# Patient Record
Sex: Female | Born: 1995 | Race: White | Hispanic: No | Marital: Single | Smoking: Never smoker
Health system: Southern US, Community
[De-identification: ages and names within clinical notes are randomized; demographics above are authoritative.]

## PROBLEM LIST (undated history)

## (undated) ENCOUNTER — Inpatient Hospital Stay (HOSPITAL_COMMUNITY): Payer: Self-pay

## (undated) DIAGNOSIS — J45909 Unspecified asthma, uncomplicated: Secondary | ICD-10-CM

## (undated) DIAGNOSIS — Z789 Other specified health status: Secondary | ICD-10-CM

## (undated) DIAGNOSIS — F1910 Other psychoactive substance abuse, uncomplicated: Secondary | ICD-10-CM

## (undated) DIAGNOSIS — F1410 Cocaine abuse, uncomplicated: Secondary | ICD-10-CM

## (undated) DIAGNOSIS — L0291 Cutaneous abscess, unspecified: Secondary | ICD-10-CM

## (undated) HISTORY — PX: DILATION AND CURETTAGE OF UTERUS: SHX78

---
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
## 2015-09-05 ENCOUNTER — Encounter (HOSPITAL_COMMUNITY): Payer: Self-pay

## 2015-10-27 ENCOUNTER — Encounter (HOSPITAL_COMMUNITY): Payer: Self-pay

## 2015-10-27 ENCOUNTER — Emergency Department (HOSPITAL_COMMUNITY)
Admission: EM | Admit: 2015-10-27 | Discharge: 2015-10-27 | Disposition: A | Discharge status: Home / Self Care | Payer: Medicaid Other | Attending: Emergency Medicine | Admitting: Emergency Medicine

## 2015-10-27 DIAGNOSIS — Z79899 Other long term (current) drug therapy: Secondary | ICD-10-CM | POA: Diagnosis not present

## 2015-10-27 DIAGNOSIS — L089 Local infection of the skin and subcutaneous tissue, unspecified: Secondary | ICD-10-CM | POA: Insufficient documentation

## 2015-10-27 DIAGNOSIS — R21 Rash and other nonspecific skin eruption: Secondary | ICD-10-CM | POA: Diagnosis present

## 2015-10-27 DIAGNOSIS — B9689 Other specified bacterial agents as the cause of diseases classified elsewhere: Secondary | ICD-10-CM

## 2015-10-27 LAB — POC URINE PREG, ED: Preg Test, Ur: NEGATIVE

## 2015-10-27 MED ORDER — SULFAMETHOXAZOLE-TRIMETHOPRIM 800-160 MG PO TABS: 1.0000 | ORAL_TABLET | Freq: Two times a day (BID) | ORAL | 0 refills | Status: AC

## 2015-10-27 MED ORDER — MUPIROCIN CALCIUM 2 % EX CREA
TOPICAL_CREAM | Freq: Two times a day (BID) | CUTANEOUS | Status: DC
  Administered 2015-10-27: 22:00:00 via TOPICAL
  Filled 2015-10-27: qty 15

## 2015-10-27 NOTE — ED Notes (Signed)
Pt departed in NAD, refused use of wheelchair. Pt given sandwich meal and bus pass.

## 2015-10-27 NOTE — ED Triage Notes (Signed)
Pt reports she has bumps, possibly boils that form on her face. She reports the one near her eye hurts and causes blurry vision. Pt reports hx of same. Pt denies recently changing household products.

## 2015-10-27 NOTE — ED Provider Notes (Signed)
[facility]PT Provider Note   CSN: [id number] Arrival date & time: 10/27/15  1609  By signing my name below, I, Christy Sartorius, attest that this documentation has been prepared under the direction and in the presence of  Kerrie Buffalo, NP. Electronically Signed: Christy Sartorius, ED Scribe. 10/27/15. 8:27 PM.  History   Chief Complaint Chief Complaint  Patient presents with  . Rash   The history is provided by the patient and medical records. No language interpreter was used.  Rash   This is a new problem. The current episode started more than 1 week ago. The problem has been gradually worsening. The problem is associated with nothing. The rash is present on the face, left upper leg and right upper leg. The pain is mild. The pain has been constant since onset. Associated symptoms include pain. She has tried nothing for the symptoms.  HPI Comments:  Cassidy Underwood is a 20 y.o. female with a history of abscess who presents to the Emergency Department complaining of a rash on her face beginning two weeks ago. Her breakout consists of hard raised areas across her face and on her upper eye.  She adds that they began at her forehead and spread down her face.  She notes a painful pressure at the lumps and states they give her a headache.  She also reports subjective fever, coughing, wheezing and chills and believes it may be due to her current living condition.  She lost her apartment and has been sleeping outside and in the woods.   Pt has had similar symptoms in the past and treated with bactrim with good results.  She denies drainage from the sites.     Past Medical History:  Diagnosis Date  . Abscess     There are no active problems to display for this patient.   Past Surgical History:  Procedure Laterality Date  . DILATION AND EVACUATION N/A 11/09/2014   Procedure: DILATATION AND EVACUATION;  Surgeon: Levie Heritage, DO;  Location: [facility];  Service: Gynecology;  Laterality: N/A;      OB History    Gravida Para Term Preterm AB Living   2             SAB TAB Ectopic Multiple Live Births                   Home Medications    Prior to Admission medications   Medication Sig Start Date End Date Taking? Authorizing Provider  doxycycline (VIBRA-TABS) 100 MG tablet Take 1 tablet (100 mg total) by mouth 2 (two) times daily. 11/09/14   Levie Heritage, DO  oxyCODONE-acetaminophen (ROXICET) 5-325 MG per tablet Take 1-2 tablets by mouth every 4 (four) hours as needed for moderate pain or severe pain. 11/09/14   Levie Heritage, DO  Prenatal Vit-Fe Fumarate-FA (PRENATAL COMPLETE) 14-0.4 MG TABS Take 1 tablet by mouth daily. 02/20/14   Rolland Porter, MD  sulfamethoxazole-trimethoprim (BACTRIM DS,SEPTRA DS) 800-160 MG tablet Take 1 tablet by mouth 2 (two) times daily. 10/27/15 11/03/15  Hope Orlene Och, NP    Family History Family History  Problem Relation Age of Onset  . Adopted: Yes    Social History Social History  Substance Use Topics  . Smoking status: Never Smoker  . Smokeless tobacco: Never Used  . Alcohol use No     Allergies   Review of patient's allergies indicates no known allergies.   Review of Systems Review of Systems  Constitutional: Positive for  chills and fever.  Respiratory: Positive for cough (dry, occasional).   Skin: Positive for rash.  Neurological: Positive for headaches.     Physical Exam Updated Vital Signs BP 125/72 (BP Location: Left Arm)   Pulse 89   Temp 98 F (36.7 C) (Oral)   Resp 14   Ht 4\' 8"  (1.422 m)   Wt 46.3 kg   LMP 10/15/2015 (Approximate)   SpO2 100%   BMI 22.87 kg/m   Physical Exam  Constitutional: She is oriented to person, place, and time. She appears well-developed and well-nourished. No distress.  HENT:  Head: Normocephalic and atraumatic.  Eyes: Conjunctivae are normal.  Cardiovascular: Normal rate.   Pulmonary/Chest: Effort normal.  Abdominal: There is no tenderness.  Neurological: She is alert and  oriented to person, place, and time.  Skin: Skin is warm and dry.  Multiple raised pustular areas, tiny facial abscesses and acne to the forehead, nose, cheeks and chin.  There are a few lesions to the upper legs.   Psychiatric: She has a normal mood and affect. Her behavior is normal.  Nursing note and vitals reviewed.    ED Treatments / Results   DIAGNOSTIC STUDIES:  Oxygen Saturation is 100% on RA, NML by my interpretation.    COORDINATION OF CARE:  8:27 PM Discussed treatment plan with pt at bedside and pt agreed to plan.  Labs (all labs ordered are listed, but only abnormal results are displayed) Labs Reviewed  POC URINE PREG, ED    Radiology No results found.  Procedures Procedures (including critical care time)  Medications Ordered in ED Medications - No data to display   Initial Impression / Assessment and Plan / ED Course  I have reviewed the triage vital signs and the nursing notes.   Clinical Course   Bactroban with first application here in the ED.  Rx Bactrim  Final Clinical Impressions(s) / ED Diagnoses  20 y.o. female with multiple pustular lesions to the face stable for d/c without fever or red streaking. Discussed with the patient plan of care and all questioned fully answered. She will return if any problems arise.  Final diagnoses:  Bacterial skin infection    New Prescriptions Discharge Medication List as of 10/27/2015  9:55 PM    START taking these medications   Details  sulfamethoxazole-trimethoprim (BACTRIM DS,SEPTRA DS) 800-160 MG tablet Take 1 tablet by mouth 2 (two) times daily., Starting Wed 10/27/2015, Until Wed 11/03/2015, Print       I personally performed the services described in this documentation, which was scribed in my presence. The recorded information has been reviewed and is accurate.     [address]Hope HemetM Neese, [place]NP 10/28/15 [id number]    Raeford RazorStephen Kohut, MD 10/28/15 [phone]

## 2015-11-04 IMAGING — US US OB COMP LESS 14 WK
1 series · 14 of 28 positions shown · non-contrast
Comparison: None.

CLINICAL DATA: Patient with recent vaginal bleeding.

EXAM:
OBSTETRIC <14 WK US AND TRANSVAGINAL OB US
TECHNIQUE: Both transabdominal and transvaginal ultrasound examinations were
performed for complete evaluation of the gestation as well as the
maternal uterus, adnexal regions, and pelvic cul-de-sac.
Transvaginal technique was performed to assess early pregnancy.

[Series 1: us ob comp less 14 wks · 14 of 64 slices shown]
[im 3/64]
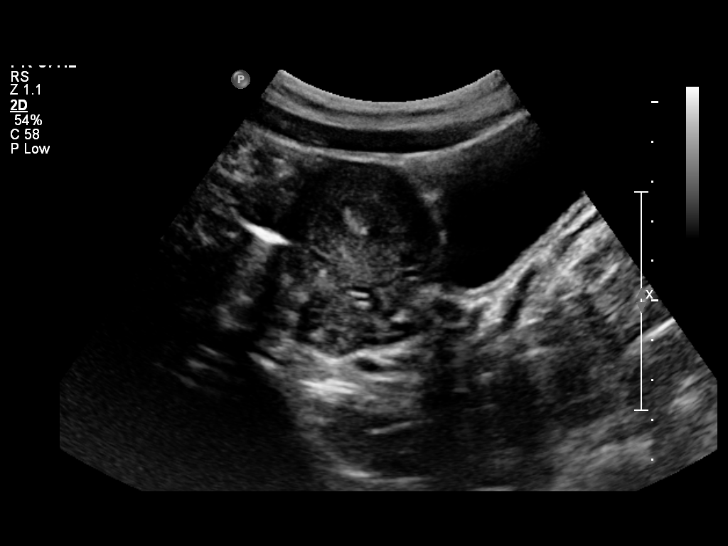
[im 8/64]
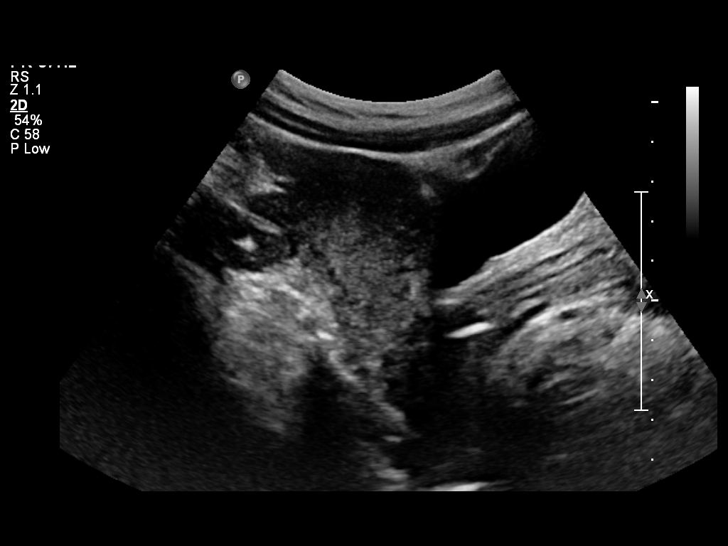
[im 12/64]
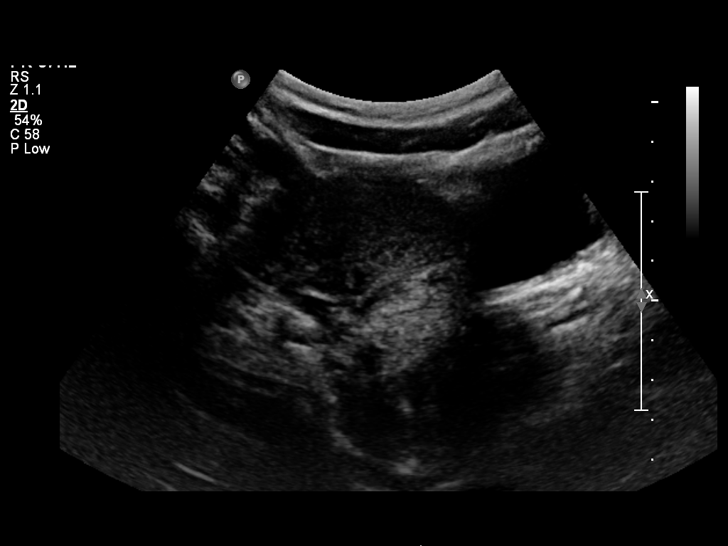
[im 17/64]
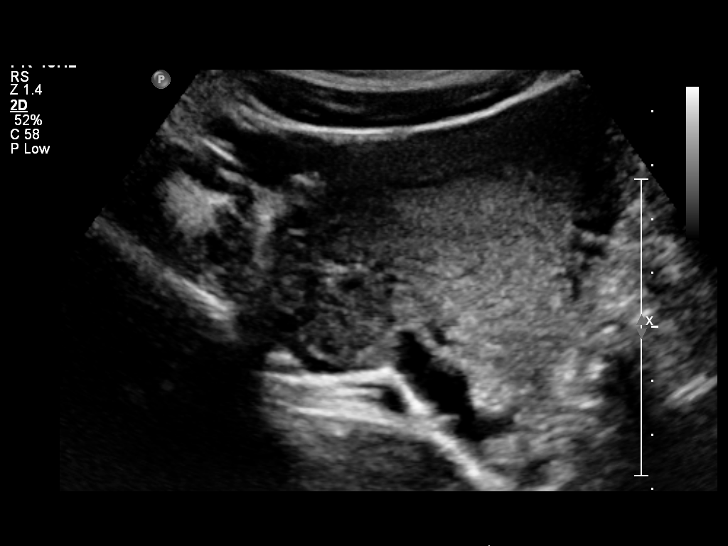
[im 22/64]
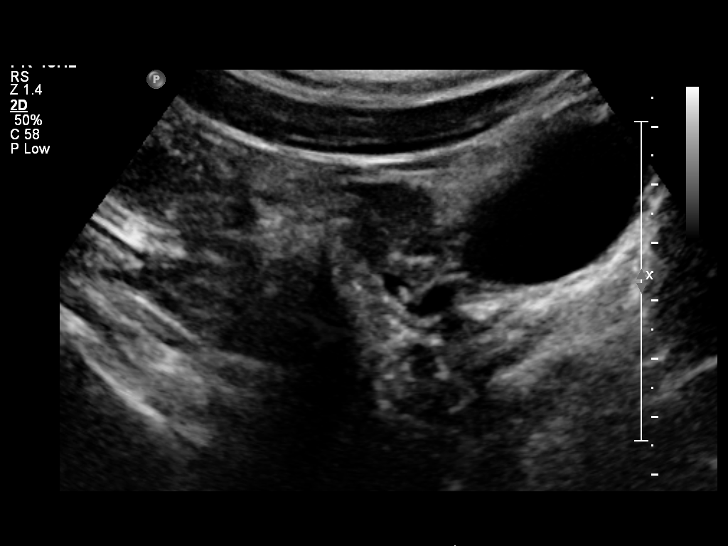
[im 26/64]
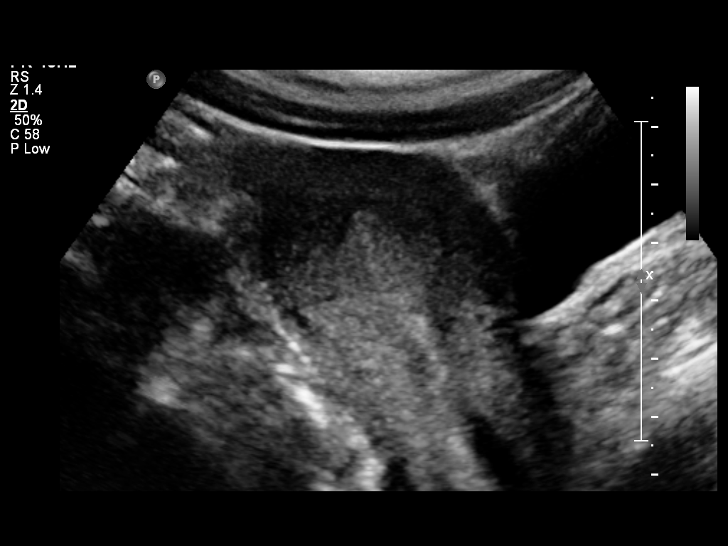
[im 31/64]
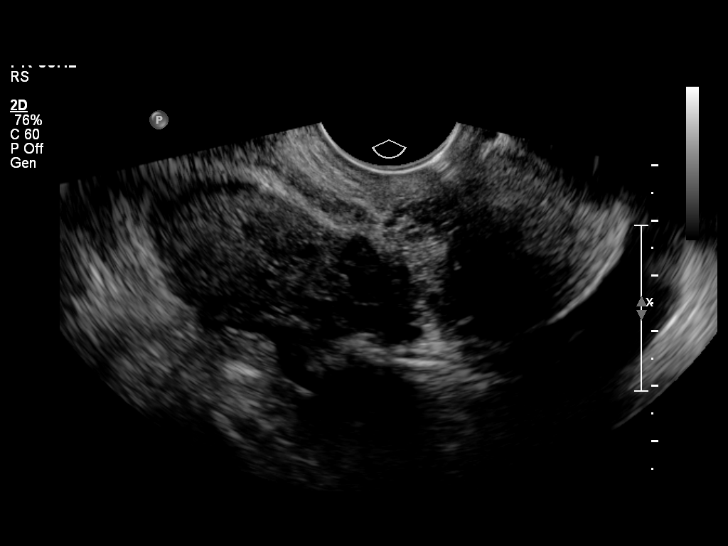
[im 36/64]
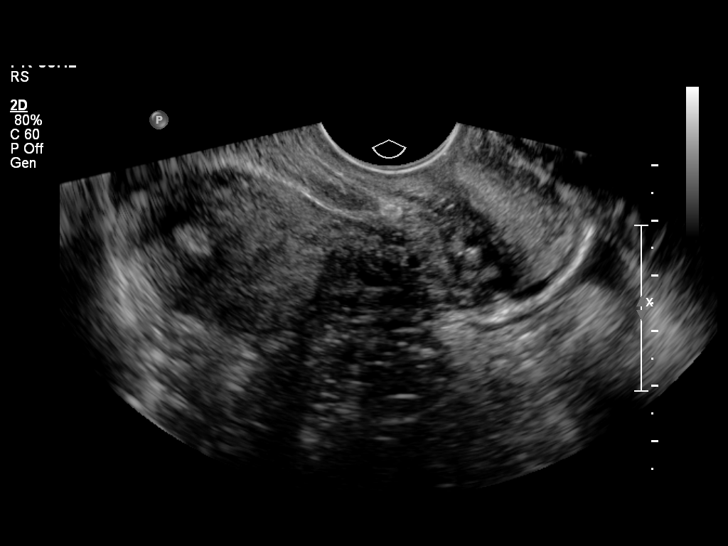
[im 40/64]
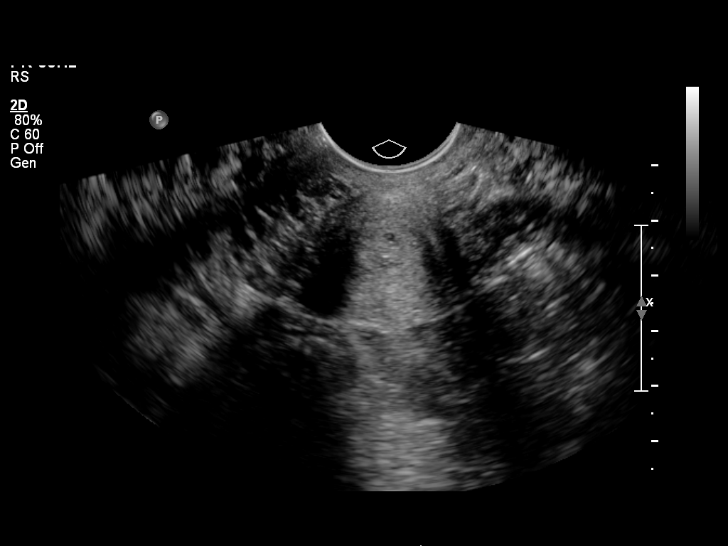
[im 45/64]
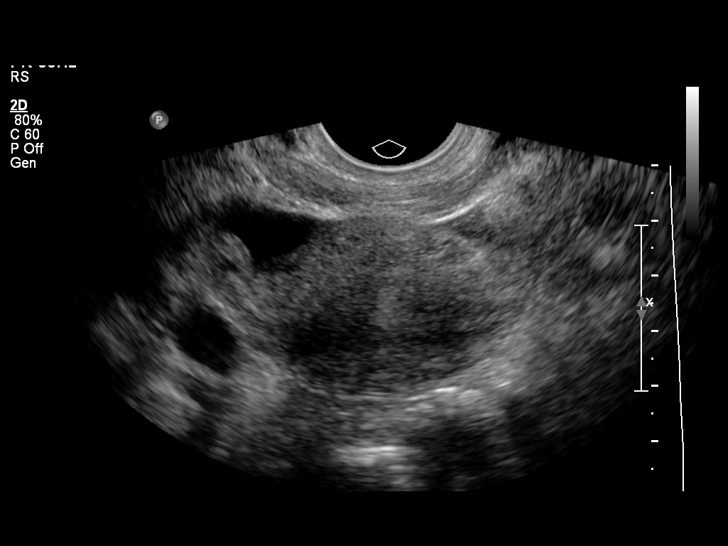
[im 50/64]
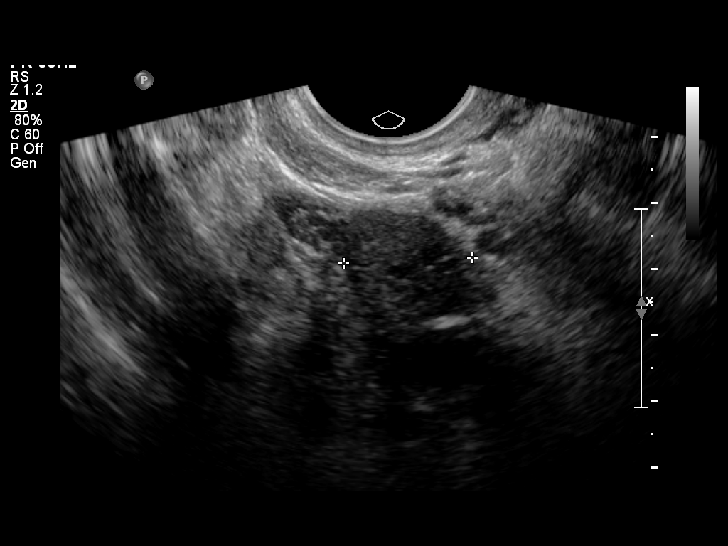
[im 54/64]
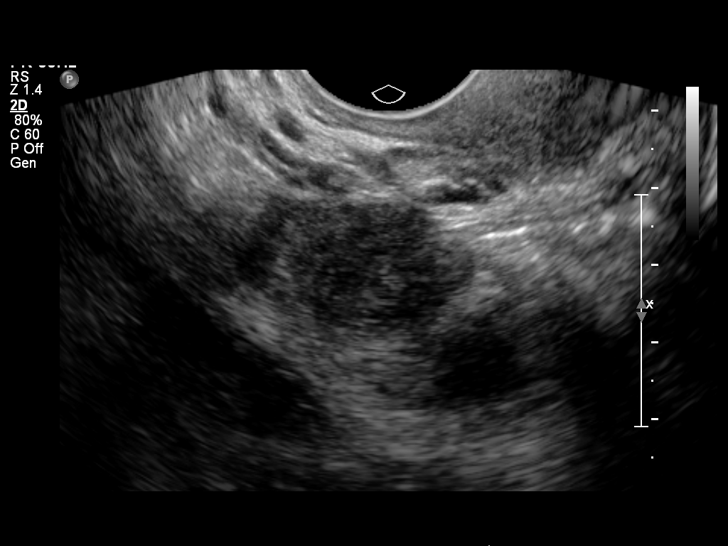
[im 59/64]
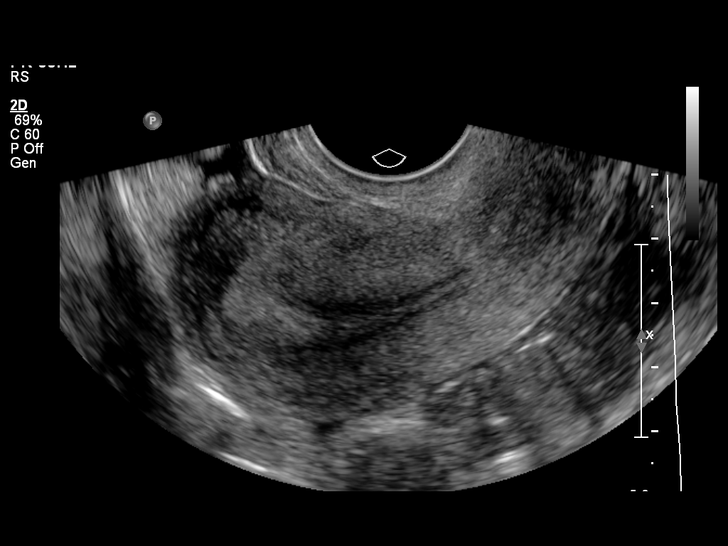
[im 64/64]
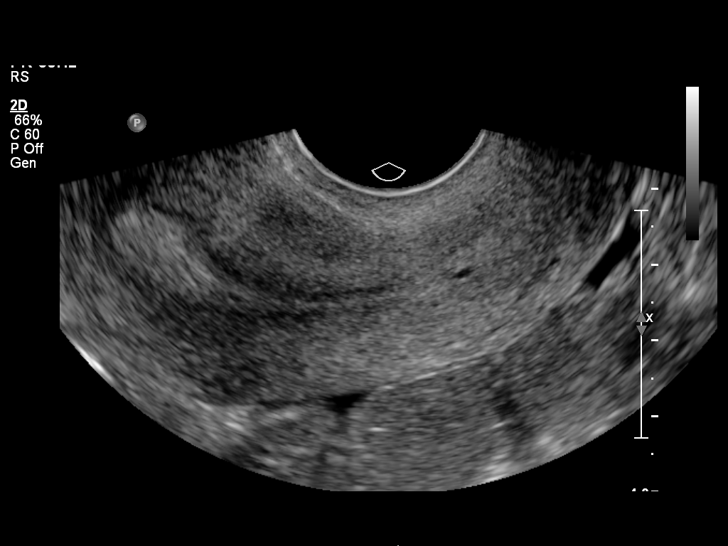

[14 of 28 positions shown; findings below may reference images not displayed]

FINDINGS: Intrauterine gestational sac: Minimal fluid in the endometrial canal
without confirmatory gestational sac at this time.

Yolk sac:  Not present

Embryo:  Not present

Cardiac Activity: Not present

Maternal uterus/adnexae: Normal right and left ovaries. Trace free
fluid in the pelvis.
IMPRESSION: Minimal fluid in the endometrial canal without definitive
confirmatory IUP at this time. In the setting of positive pregnancy
test and no definite intrauterine pregnancy, this reflects a
pregnancy of unknown location. Differential considerations include
early normal IUP, abnormal IUP, or nonvisualized ectopic pregnancy.
Differentiation is achieved with serial beta HCG supplemented by
repeat sonography as clinically warranted.

## 2015-12-30 ENCOUNTER — Emergency Department (HOSPITAL_COMMUNITY)
Admission: EM | Admit: 2015-12-30 | Discharge: 2015-12-30 | Disposition: A | Discharge status: Home / Self Care | Payer: Medicaid Other | Attending: Emergency Medicine | Admitting: Emergency Medicine

## 2015-12-30 ENCOUNTER — Encounter (HOSPITAL_COMMUNITY): Payer: Self-pay | Admitting: Emergency Medicine

## 2015-12-30 DIAGNOSIS — N390 Urinary tract infection, site not specified: Secondary | ICD-10-CM | POA: Diagnosis not present

## 2015-12-30 DIAGNOSIS — R319 Hematuria, unspecified: Secondary | ICD-10-CM | POA: Diagnosis not present

## 2015-12-30 DIAGNOSIS — R300 Dysuria: Secondary | ICD-10-CM | POA: Diagnosis present

## 2015-12-30 LAB — URINALYSIS, ROUTINE W REFLEX MICROSCOPIC
Bilirubin Urine: NEGATIVE
Glucose, UA: NEGATIVE mg/dL
KETONES UR: 15 mg/dL — AB
NITRITE: POSITIVE — AB
PH: 6 (ref 5.0–8.0)
Protein, ur: 100 mg/dL — AB
Specific Gravity, Urine: 1.023 (ref 1.005–1.030)

## 2015-12-30 LAB — POC URINE PREG, ED: PREG TEST UR: NEGATIVE

## 2015-12-30 LAB — URINE MICROSCOPIC-ADD ON

## 2015-12-30 MED ORDER — CEPHALEXIN 500 MG PO CAPS: 500.0000 mg | ORAL_CAPSULE | Freq: Three times a day (TID) | ORAL | 0 refills | Status: AC

## 2015-12-30 NOTE — ED Triage Notes (Signed)
Pt sts dysuria and burning x 3 days

## 2015-12-30 NOTE — ED Provider Notes (Signed)
[facility] Provider Note   CSN: [id number] Arrival date & time: 12/30/15  1431     History   Chief Complaint Chief Complaint  Patient presents with  . Urinary Tract Infection    HPI Cassidy Underwood is a 20 y.o. female.  The history is provided by the patient.  Dysuria   This is a new problem. Episode onset: 3 days ago. The problem occurs every urination. The problem has not changed since onset.The pain is mild. There has been no fever. Pertinent negatives include no chills, no vomiting and no flank pain. Associated symptoms comments: Low back pain. She has tried nothing for the symptoms.    Past Medical History:  Diagnosis Date  . Abscess     There are no active problems to display for this patient.   Past Surgical History:  Procedure Laterality Date  . DILATION AND EVACUATION N/A 11/09/2014   Procedure: DILATATION AND EVACUATION;  Surgeon: Levie HeritageJacob J Stinson, DO;  Location: [facility];  Service: Gynecology;  Laterality: N/A;    OB History    Gravida Para Term Preterm AB Living   2             SAB TAB Ectopic Multiple Live Births                   Home Medications    Prior to Admission medications   Medication Sig Start Date End Date Taking? Authorizing Provider  cephALEXin (KEFLEX) 500 MG capsule Take 1 capsule (500 mg total) by mouth 3 (three) times daily. 12/30/15 01/06/16  Lyndal Pulleyaniel Jemarion Roycroft, MD  doxycycline (VIBRA-TABS) 100 MG tablet Take 1 tablet (100 mg total) by mouth 2 (two) times daily. 11/09/14   Levie HeritageJacob J Stinson, DO  oxyCODONE-acetaminophen (ROXICET) 5-325 MG per tablet Take 1-2 tablets by mouth every 4 (four) hours as needed for moderate pain or severe pain. 11/09/14   Levie HeritageJacob J Stinson, DO  Prenatal Vit-Fe Fumarate-FA (PRENATAL COMPLETE) 14-0.4 MG TABS Take 1 tablet by mouth daily. 02/20/14   Rolland PorterMark James, MD    Family History Family History  Problem Relation Age of Onset  . Adopted: Yes    Social History Social History  Substance Use Topics  . Smoking  status: Never Smoker  . Smokeless tobacco: Never Used  . Alcohol use No     Allergies   Patient has no known allergies.   Review of Systems Review of Systems  Constitutional: Negative for chills.  Gastrointestinal: Negative for vomiting.  Genitourinary: Positive for dysuria. Negative for flank pain.  All other systems reviewed and are negative.    Physical Exam Updated Vital Signs BP 112/67 (BP Location: Right Arm)   Pulse 92   Temp 97.8 F (36.6 C) (Oral)   Resp 12   Ht 4\' 11"  (1.499 m)   Wt 96 lb 8 oz (43.8 kg)   SpO2 99%   BMI 19.49 kg/m   Physical Exam  Constitutional: She is oriented to person, place, and time. She appears well-developed and well-nourished. No distress.  HENT:  Head: Normocephalic.  Nose: Nose normal.  Eyes: Conjunctivae are normal.  Neck: Neck supple. No tracheal deviation present.  Cardiovascular: Normal rate, regular rhythm and normal heart sounds.   Pulmonary/Chest: Effort normal and breath sounds normal. No respiratory distress.  Abdominal: Soft. She exhibits no distension. There is no tenderness. There is no rebound and no guarding.  Neurological: She is alert and oriented to person, place, and time.  Skin: Skin is warm and dry.  Psychiatric: She has a normal mood and affect.     ED Treatments / Results  Labs (all labs ordered are listed, but only abnormal results are displayed) Labs Reviewed  URINALYSIS, ROUTINE W REFLEX MICROSCOPIC (NOT AT [facility]) - Abnormal; Notable for the following:       Result Value   APPearance TURBID (*)    Hgb urine dipstick LARGE (*)    Ketones, ur 15 (*)    Protein, ur 100 (*)    Nitrite POSITIVE (*)    Leukocytes, UA MODERATE (*)    All other components within normal limits  URINE MICROSCOPIC-ADD ON - Abnormal; Notable for the following:    Squamous Epithelial / LPF 0-5 (*)    Bacteria, UA RARE (*)    All other components within normal limits  URINE CULTURE  POC URINE PREG, ED    EKG  EKG  Interpretation None       Radiology No results found.  Procedures Procedures (including critical care time)  Medications Ordered in ED Medications - No data to display   Initial Impression / Assessment and Plan / ED Course  I have reviewed the triage vital signs and the nursing notes.  Pertinent labs & imaging results that were available during my care of the patient were reviewed by me and considered in my medical decision making (see chart for details).  Clinical Course     20 y.o. female presents with typical UTI symptoms of dysuria and low back pain. No signs of sepsis or complicated infection but grossly infected UA. Will treat empirically with keflex pending culture. Plan to follow up with PCP as needed and return precautions discussed for worsening or new concerning symptoms.   Final Clinical Impressions(s) / ED Diagnoses   Final diagnoses:  Urinary tract infection with hematuria, site unspecified    New Prescriptions Discharge Medication List as of 12/30/2015  4:02 PM    START taking these medications   Details  cephALEXin (KEFLEX) 500 MG capsule Take 1 capsule (500 mg total) by mouth 3 (three) times daily., Starting Thu 12/30/2015, Until Thu 01/06/2016, Print         Lyndal Pulleyaniel Nekayla Heider, MD 12/30/15 2312

## 2016-01-01 LAB — URINE CULTURE

## 2016-01-02 ENCOUNTER — Telehealth (HOSPITAL_BASED_OUTPATIENT_CLINIC_OR_DEPARTMENT_OTHER): Payer: Self-pay

## 2016-01-02 NOTE — Telephone Encounter (Signed)
Post ED Visit - Positive Culture Follow-up  Culture report reviewed by antimicrobial stewardship pharmacist:  []  Enzo BiNathan Batchelder, Pharm.D. []  Celedonio MiyamotoJeremy Frens, [address]Ph[address]D., BCPS []  Garvin FilaMike Maccia, Pharm.D. []  Georgina PillionElizabeth Martin, Pharm.D., BCPS []  [place]Minh Pham, [address]Pharm[address]D., BCPS, AAHIVP []  Estella HuskMichelle Turner, Pharm.D., BCPS, AAHIVP []  Tennis Mustassie Stewart, [address]Pharm[address]D. []  Sherle Poeob Vincent, [address]Ph[address] Revonda StandardAllison Masters Pharm D Positive urine culture Treated with Cephalexin, organism sensitive to the same and no further patient follow-up is required at this time.  Jerry CarasCullom, Sterling Ucci Burnett 01/02/2016, 9:57 AM

## 2016-02-14 NOTE — L&D Delivery Note (Signed)
Patient is 21 y.o. [id number] [redacted]w[redacted]d admitted for SOL.  Prenatal course also complicated by homelessness, drug use, scant PNC.  Delivery Note At 5:14 PM a viable female was delivered via Vaginal, Spontaneous Delivery (Presentation: cephalic; ROA).  APGAR: 8, 9; weight 5 lb 2.2 oz (2330 g).   Placenta status: grossly normal, intact; sent to pathology  Cord: 3 vessel without complications.  Cord pH: not drawn.  Anesthesia: Epidural Episiotomy: None Lacerations: None Suture Repair: none Est. Blood Loss (mL): 250  Mom to postpartum.  Baby to Couplet care / Skin to Skin.  Upon arrival patient was complete and pushing. She pushed with good maternal effort to deliver a viable female infant in cephalic, ROA position. No nuchal cord present.  Baby delivered without difficulty (anterior shoulder delivered with ease), was noted to have good tone and placed on maternal abdomen for oral suctioning, drying and stimulation. Delayed cord clamping performed. Placenta delivered spontaneously with gentle cord traction. Fundus firm with massage and Pitocin. Perineum inspected and found to have no lacerations. Counts of sharps, instruments, and lap pads were all correct.   Amanda C. Frances Furbish, MD PGY-1, [facility] 11/08/2016 5:46 PM

## 2016-02-27 NOTE — Congregational Nurse Program (Signed)
Congregational Nurse Program Note  Date of Encounter: 02/18/2016  Past Medical History: Past Medical History:  Diagnosis Date  . Abscess     Encounter Details:     CNP Questionnaire - 02/18/16 1129      Patient Demographics   Is this a new or existing patient? New   Patient is considered a/an Not Applicable   Race Caucasian/White     Patient Assistance   Location of Patient Assistance Not Applicable   Patient's financial/insurance status Low Income;Medicaid   Uninsured Patient ([employer], trade [employer]Card/[employer]s) No   Patient referred to apply for the following financial assistance Not Applicable   Food insecurities addressed Not Applicable   Transportation assistance No   Assistance securing medications No   Educational health offerings Behavioral health;Navigating the healthcare system     Encounter Details   Primary purpose of visit Chronic Illness/Condition Visit;Navigating the Healthcare System   Was an Emergency Department visit averted? Not Applicable   Does patient have a medical provider? No   Patient referred to Area Agency   Was a mental health screening completed? (GAINS tool) No   Does patient have dental issues? No   Does patient have vision issues? No   Does your patient have an abnormal blood pressure today? No   Since previous encounter, have you referred patient for abnormal blood pressure that resulted in a new diagnosis or medication change? No   Does your patient have an abnormal blood glucose today? No   Since previous encounter, have you referred patient for abnormal blood glucose that resulted in a new diagnosis or medication change? No   Was there a life-saving intervention made? No     Requesting assistance with behavioral health treatment and medications.  [facility].

## 2016-10-02 ENCOUNTER — Inpatient Hospital Stay (HOSPITAL_COMMUNITY): Payer: Medicaid Other

## 2016-10-02 ENCOUNTER — Inpatient Hospital Stay (HOSPITAL_COMMUNITY)
Admission: AD | Admit: 2016-10-02 | Discharge: 2016-10-04 | DRG: 778 | Disposition: A | Discharge status: Home / Self Care | Payer: Medicaid Other | Source: Ambulatory Visit | Attending: Obstetrics and Gynecology | Admitting: Obstetrics and Gynecology

## 2016-10-02 ENCOUNTER — Encounter (HOSPITAL_COMMUNITY): Payer: Self-pay | Admitting: *Deleted

## 2016-10-02 DIAGNOSIS — O479 False labor, unspecified: Secondary | ICD-10-CM | POA: Diagnosis not present

## 2016-10-02 DIAGNOSIS — Z3A30 30 weeks gestation of pregnancy: Secondary | ICD-10-CM

## 2016-10-02 DIAGNOSIS — Z590 Homelessness: Secondary | ICD-10-CM | POA: Diagnosis not present

## 2016-10-02 DIAGNOSIS — O0931 Supervision of pregnancy with insufficient antenatal care, first trimester: Secondary | ICD-10-CM

## 2016-10-02 DIAGNOSIS — O4703 False labor before 37 completed weeks of gestation, third trimester: Secondary | ICD-10-CM | POA: Diagnosis not present

## 2016-10-02 DIAGNOSIS — Z3A31 31 weeks gestation of pregnancy: Secondary | ICD-10-CM | POA: Diagnosis not present

## 2016-10-02 DIAGNOSIS — O4700 False labor before 37 completed weeks of gestation, unspecified trimester: Secondary | ICD-10-CM | POA: Diagnosis present

## 2016-10-02 DIAGNOSIS — R109 Unspecified abdominal pain: Secondary | ICD-10-CM | POA: Diagnosis present

## 2016-10-02 DIAGNOSIS — Z363 Encounter for antenatal screening for malformations: Secondary | ICD-10-CM

## 2016-10-02 DIAGNOSIS — O0933 Supervision of pregnancy with insufficient antenatal care, third trimester: Secondary | ICD-10-CM | POA: Diagnosis not present

## 2016-10-02 LAB — WET PREP, GENITAL
CLUE CELLS WET PREP: NONE SEEN
SPERM: NONE SEEN
YEAST WET PREP: NONE SEEN

## 2016-10-02 LAB — URINALYSIS, ROUTINE W REFLEX MICROSCOPIC
Bilirubin Urine: NEGATIVE
Glucose, UA: 150 mg/dL — AB
Ketones, ur: NEGATIVE mg/dL
Nitrite: NEGATIVE
PH: 6 (ref 5.0–8.0)
Protein, ur: 30 mg/dL — AB
SPECIFIC GRAVITY, URINE: 1.023 (ref 1.005–1.030)

## 2016-10-02 LAB — CBC
HCT: 31 % — ABNORMAL LOW (ref 36.0–46.0)
Hemoglobin: 10.8 g/dL — ABNORMAL LOW (ref 12.0–15.0)
MCH: 30.4 pg (ref 26.0–34.0)
MCHC: 34.8 g/dL (ref 30.0–36.0)
MCV: 87.3 fL (ref 78.0–100.0)
PLATELETS: 293 10*3/uL (ref 150–400)
RBC: 3.55 MIL/uL — AB (ref 3.87–5.11)
RDW: 13 % (ref 11.5–15.5)
WBC: 11.1 10*3/uL — AB (ref 4.0–10.5)

## 2016-10-02 LAB — TYPE AND SCREEN
ABO/RH(D): A POS
ANTIBODY SCREEN: NEGATIVE

## 2016-10-02 LAB — GLUCOSE, FASTING: GLUCOSE, FASTING: 88 mg/dL (ref 65–99)

## 2016-10-02 LAB — GLUCOSE TOLERANCE, 1 HOUR: GLUCOSE 1 HOUR GTT: 133 mg/dL (ref 70–140)

## 2016-10-02 MED ORDER — LACTATED RINGERS IV SOLN
INTRAVENOUS | Status: DC
  Administered 2016-10-02 – 2016-10-03 (×3): via INTRAVENOUS

## 2016-10-02 MED ORDER — DOCUSATE SODIUM 100 MG PO CAPS
100.0000 mg | ORAL_CAPSULE | Freq: Every day | ORAL | Status: DC
  Filled 2016-10-02 (×2): qty 1

## 2016-10-02 MED ORDER — MAGNESIUM SULFATE BOLUS VIA INFUSION
4.0000 g | Freq: Once | INTRAVENOUS | Status: AC
  Administered 2016-10-02: 4 g via INTRAVENOUS
  Filled 2016-10-02: qty 500

## 2016-10-02 MED ORDER — ACETAMINOPHEN 325 MG PO TABS: 650.0000 mg | ORAL_TABLET | ORAL | Status: DC | PRN

## 2016-10-02 MED ORDER — METRONIDAZOLE 500 MG PO TABS
500.0000 mg | ORAL_TABLET | Freq: Two times a day (BID) | ORAL | Status: DC
  Administered 2016-10-03 (×3): 500 mg via ORAL
  Filled 2016-10-02 (×5): qty 1

## 2016-10-02 MED ORDER — BETAMETHASONE SOD PHOS & ACET 6 (3-3) MG/ML IJ SUSP
12.0000 mg | INTRAMUSCULAR | Status: AC
  Administered 2016-10-03 – 2016-10-04 (×2): 12 mg via INTRAMUSCULAR
  Filled 2016-10-02 (×2): qty 2

## 2016-10-02 MED ORDER — MAGNESIUM SULFATE 40 G IN LACTATED RINGERS - SIMPLE
2.0000 g/h | INTRAVENOUS | Status: AC
  Administered 2016-10-03 (×2): 2 g/h via INTRAVENOUS
  Filled 2016-10-02 (×2): qty 40
  Filled 2016-10-02: qty 500

## 2016-10-02 MED ORDER — PRENATAL MULTIVITAMIN CH
1.0000 | ORAL_TABLET | Freq: Every day | ORAL | Status: DC
  Filled 2016-10-02: qty 1

## 2016-10-02 MED ORDER — CALCIUM CARBONATE ANTACID 500 MG PO CHEW: 2.0000 | CHEWABLE_TABLET | ORAL | Status: DC | PRN

## 2016-10-02 NOTE — MAU Note (Signed)
Has been getting a lot of shooting pains from mid abd down, and cramps.  Doesn't know what is going on.  No PNC.

## 2016-10-02 NOTE — Progress Notes (Signed)
Cassidy Underwood was reclined in bed when I arrived. Her SO (Mervin) was bedside. She said she didn't know what was going on and that she was told she is 1 1/2cm but does not know what that means. I told her to be sure and ask her nurse and she would explain. During our conversation pt shared that they are homeless; her SO has a new job and they hope things will get btr. Initially SO seemed embarrassed (holding his head down) by the conversation as she shared she has not had healthcare attn to her pregnancy. She said initially she was going to the pregnancy ctr, however, after a certain period they would no longer see her. I shared compassion and understanding and soon her SO was looking up and joined in the conversation. CH provided empathic listening and encouragement. Pt requested prayer and her SO joined [place]. They were both very appreciative of the visit and prayer. Please page if additional spt is needed.  [phone] Chaplain Marjory Lies, MDiv   10/02/16 2000  Clinical Encounter Type  Visited With Patient and family together

## 2016-10-02 NOTE — MAU Note (Signed)
Pt started drinking GTT at 2155. Lab to be drawn @2257 .

## 2016-10-02 NOTE — H&P (Addendum)
Cassidy Underwood is a 21 y.o. female G3P0020 at approximately 30 weeks by thrid trimester ultrasound presenting for evaluation of abdominal pain for the past 2 weeks. Patient reports pain increased in intensity today. She reports good fetal movement. Patient denies vaginal bleeding or leakage of fluid. She has not received prenatal care. She is currently homeless and plans to give baby up for adoption.  OB History    Gravida Para Term Preterm AB Living   3       2     SAB TAB Ectopic Multiple Live Births   1   1         Past Medical History:  Diagnosis Date  . Abscess    Past Surgical History:  Procedure Laterality Date  . DILATION AND EVACUATION N/A 11/09/2014   Procedure: DILATATION AND EVACUATION;  Surgeon: Levie Heritage, DO;  Location: [facility];  Service: Gynecology;  Laterality: N/A;   Family History: family history is not on file. She was adopted. Social History:  reports that she has never smoked. She has never used smokeless tobacco. She reports that she does not drink alcohol or use drugs.   No prenatal caer  ROS  See pertinent in HPI History   Blood pressure 113/62, pulse 98, temperature 98.6 F (37 C), temperature source Oral, resp. rate 16, weight 102 lb 12 oz (46.6 kg), SpO2 100 %, unknown if currently breastfeeding. Exam Physical Exam  GENERAL: Well-developed, well-nourished female in no acute distress.  ABDOMEN: Soft, nontender, gravid PELVIC: Normal external female genitalia. Vagina is pink and rugated.  Frothy discharge. Normal appearing cervix.  Cervix: 1/50/high EXTREMITIES: No cyanosis, clubbing, or edema, 2+ distal pulses. FHT: baseline 140, mod variability, + 10x 10 accels, no decels Contractions q 3 minutes  Prenatal labs: ABO, Rh:   Antibody:   Rubella:   RPR:    HBsAg:    HIV:    GBS:     Assessment/Plan: 21 yo G3P0020 at 30 weeks with preterm contractions  - Admit to antepartum - Tocolysis with magnesium sulfate for 24 hours - 1 hr glucola - BMZ  x 2 following glucola - Patient with trich on wet prep- will treat with flagyl - Admission discussed with NICU attending given current high NICU census    Shanena Pellegrino 10/02/2016, 8:58 PM

## 2016-10-02 NOTE — MAU Note (Signed)
Pt says she is living in a boarding house and plans to give the baby up for adoption.

## 2016-10-03 DIAGNOSIS — O479 False labor, unspecified: Secondary | ICD-10-CM

## 2016-10-03 DIAGNOSIS — O0933 Supervision of pregnancy with insufficient antenatal care, third trimester: Secondary | ICD-10-CM

## 2016-10-03 LAB — RPR: RPR: NONREACTIVE

## 2016-10-03 LAB — GLUCOSE, 2 HOUR: GLUCOSE, 2 HOUR: 134 mg/dL (ref 70–139)

## 2016-10-03 LAB — GC/CHLAMYDIA PROBE AMP (~~LOC~~) NOT AT ARMC
Chlamydia: NEGATIVE
NEISSERIA GONORRHEA: NEGATIVE

## 2016-10-03 MED ORDER — HYDROCODONE-ACETAMINOPHEN 7.5-325 MG/15ML PO SOLN: 15.0000 mL | Freq: Once | ORAL | Status: DC

## 2016-10-03 NOTE — Progress Notes (Signed)
Patient ID: Cassidy Underwood, female   DOB: 09-10-1995, 21 y.o.   MRN: [id number]  FACULTY PRACTICE ANTEPARTUM(COMPREHENSIVE) NOTE  Cassidy Underwood is a 21 y.o. G3P0020 at Unknown by best clinical estimate who is admitted for Preterm labor.   Fetal presentation is cephalic. Length of Stay:  1  Days  Subjective: Decreasing contractions Patient reports the fetal movement as active. Patient reports uterine contraction  activity as none. Patient reports  vaginal bleeding as none. Patient describes fluid per vagina as None.  Vitals:  Blood pressure 113/70, pulse 91, temperature 97.7 F (36.5 C), temperature source Oral, resp. rate 18, height 4\' 9"  (1.448 m), weight 102 lb 12 oz (46.6 kg), SpO2 99 %, unknown if currently breastfeeding. Physical Examination:  Mental status - alert, oriented to person, place, and time Chest - normal effort Abdomen - gravid,Non-tender Fundal Height:  size equals dates Extremities: Homans sign is negative, no sign of DVT  Membranes:intact  Fetal Monitoring:  Baseline: 120 bpm, Variability: Good {> 6 bpm), Accelerations: Reactive and Decelerations: Absent  Labs:  Results for orders placed or performed during the hospital encounter of 10/02/16 (from the past 24 hour(s))  Wet prep, genital   Collection Time: 10/02/16  7:14 PM  Result Value Ref Range   Yeast Wet Prep HPF POC NONE SEEN NONE SEEN   Trich, Wet Prep PRESENT (A) NONE SEEN   Clue Cells Wet Prep HPF POC NONE SEEN NONE SEEN   WBC, Wet Prep HPF POC MANY (A) NONE SEEN   Sperm NONE SEEN   Urinalysis, Routine w reflex microscopic   Collection Time: 10/02/16  8:31 PM  Result Value Ref Range   Color, Urine YELLOW YELLOW   APPearance CLOUDY (A) CLEAR   Specific Gravity, Urine 1.023 1.005 - 1.030   pH 6.0 5.0 - 8.0   Glucose, UA 150 (A) NEGATIVE mg/dL   Hgb urine dipstick SMALL (A) NEGATIVE   Bilirubin Urine NEGATIVE NEGATIVE   Ketones, ur NEGATIVE NEGATIVE mg/dL   Protein, ur 30 (A) NEGATIVE mg/dL   Nitrite  NEGATIVE NEGATIVE   Leukocytes, UA LARGE (A) NEGATIVE   RBC / HPF TOO NUMEROUS TO COUNT 0 - 5 RBC/hpf   WBC, UA TOO NUMEROUS TO COUNT 0 - 5 WBC/hpf   Bacteria, UA FEW (A) NONE SEEN   Squamous Epithelial / LPF 6-30 (A) NONE SEEN   WBC Clumps PRESENT    Mucus PRESENT   Type and screen [facility]   Collection Time: 10/02/16  9:53 PM  Result Value Ref Range   ABO/RH(D) A POS    Antibody Screen NEG    Sample Expiration 10/05/2016   CBC on admission   Collection Time: 10/02/16  9:54 PM  Result Value Ref Range   WBC 11.1 (H) 4.0 - 10.5 K/uL   RBC 3.55 (L) 3.87 - 5.11 MIL/uL   Hemoglobin 10.8 (L) 12.0 - 15.0 g/dL   HCT 41.3 (L) 24.4 - 01.0 %   MCV 87.3 78.0 - 100.0 fL   MCH 30.4 26.0 - 34.0 pg   MCHC 34.8 30.0 - 36.0 g/dL   RDW 27.2 53.6 - 64.4 %   Platelets 293 150 - 400 K/uL  Glucose, fasting   Collection Time: 10/02/16  9:55 PM  Result Value Ref Range   Glucose, Fasting 88 65 - 99 mg/dL  Glucose tolerance, 1 hour (Glucola)   Collection Time: 10/02/16 11:00 PM  Result Value Ref Range   Glucose, 1 Hour GTT 133 70 - 140  mg/dL  Glucose, 2 hour   Collection Time: 10/02/16 11:53 PM  Result Value Ref Range   Glucose, 2 hour 134 70 - 139 mg/dL    Medications:  Scheduled . betamethasone acetate-betamethasone sodium phosphate  12 mg Intramuscular Q24H  . docusate sodium  100 mg Oral Daily  . metroNIDAZOLE  500 mg Oral Q12H  . prenatal multivitamin  1 tablet Oral Q1200   I have reviewed the patient's current medications.  ASSESSMENT: Patient Active Problem List   Diagnosis Date Noted  . Preterm contractions 10/02/2016  . No prenatal care in current pregnancy in third trimester 10/02/2016    PLAN: Continue Magnesium x 24 hours 2nd betamethasone tonight Add Procardia this pm SW to see patient regarding homelessness Likely home tomorrow  Reva Bores, MD 10/03/2016,9:57 AM

## 2016-10-04 ENCOUNTER — Encounter (HOSPITAL_COMMUNITY): Payer: Self-pay | Admitting: *Deleted

## 2016-10-04 DIAGNOSIS — Z3A31 31 weeks gestation of pregnancy: Secondary | ICD-10-CM

## 2016-10-04 DIAGNOSIS — O4703 False labor before 37 completed weeks of gestation, third trimester: Secondary | ICD-10-CM

## 2016-10-04 MED ORDER — METRONIDAZOLE 500 MG PO TABS: 500.0000 mg | ORAL_TABLET | Freq: Two times a day (BID) | ORAL | 0 refills | Status: DC

## 2016-10-04 MED ORDER — HYDROCODONE-ACETAMINOPHEN 7.5-325 MG/15ML PO SOLN: 15.0000 mL | Freq: Once | ORAL | Status: DC

## 2016-10-04 MED ORDER — ACETAMINOPHEN 160 MG/5ML PO SOLN
325.0000 mg | Freq: Once | ORAL | Status: AC
  Administered 2016-10-04: 325 mg via ORAL
  Filled 2016-10-04: qty 20.3

## 2016-10-04 MED ORDER — OXYCODONE HCL 5 MG/5ML PO SOLN
10.0000 mg | Freq: Once | ORAL | Status: AC
  Administered 2016-10-04: 10 mg via ORAL
  Filled 2016-10-04: qty 10

## 2016-10-04 MED ORDER — PRENATAL MULTIVITAMIN CH
1.0000 | ORAL_TABLET | Freq: Every day | ORAL | 2 refills | Status: DC
Start: 2016-10-04 — End: 2016-11-08

## 2016-10-04 NOTE — Discharge Summary (Signed)
Antenatal Physician Discharge Summary  Patient ID: Cassidy Underwood MRN: [id number] DOB/AGE: 10/27/1995 21 y.o.  Admit date: 10/02/2016 Discharge date: 10/04/2016  Admission Diagnoses:Principal Problem:   Preterm contractions Active Problems:   No prenatal care in current pregnancy in third trimester    Discharge Diagnoses: Same  Prenatal Procedures: NST, ultrasound and tocolysis and Betamethasone  Consults: Neonatology, Maternal Fetal Medicine  Hospital Course:  This is a 21 y.o. G3P0020 with IUP at Unknown admitted for preterm labor. She was admitted with contractions, noted to have a cervical exam of  1/50/high .  No leaking of fluid and no bleeding.  She was initially started on magnesium sulfate for tocolysis and neuroprotection and also received betamethasone x 2 doses.  Her tocolysis was transitioned to Procardia. She was seen by Neonatology during her stay.  She was observed, fetal heart rate monitoring remained reassuring, and she had no signs/symptoms of progressing preterm labor or other maternal-fetal concerns.  Her cervical exam was unchanged from admission.  She was deemed stable for discharge to home with outpatient follow up.  Discharge Exam: Temp:  [97.5 F (36.4 C)-98.7 F (37.1 C)] 97.5 F (36.4 C) (08/22 0800) Pulse Rate:  [89-94] 93 (08/22 0800) Resp:  [16-18] 18 (08/22 0800) BP: (99-114)/(50-69) 114/60 (08/22 0800) SpO2:  [99 %-100 %] 99 % (08/22 0800) Physical Examination: CONSTITUTIONAL: Well-developed, well-nourished female in no acute distress.  HENT:  Normocephalic, atraumatic, External right and left ear normal. EYES: Conjunctivae and EOM are normal.No scleral icterus.  NECK: Normal range of motion, supple, no masses SKIN: Skin is warm and dry. No rash noted. NEUROLGIC: Alert and oriented to person, place, and time.  PSYCHIATRIC: Normal mood and affect. CARDIOVASCULAR: Normal heart rate noted, regular rhythm RESPIRATORY: Effort and breath sounds  normal MUSCULOSKELETAL: Normal range of motion.  ABDOMEN: Soft, nontender, nondistended, gravid. CERVIX:   Fetal monitoring: FHR: 130 bpm, Variability: moderate, Accelerations: Present, Decelerations: Absent  Uterine activity: 0 contractions per hour  Significant Diagnostic Studies:  Results for orders placed or performed during the hospital encounter of 10/02/16 (from the past 168 hour(s))  GC/Chlamydia probe amp ([place])not at [facility]   Collection Time: 10/02/16 12:00 AM  Result Value Ref Range   Chlamydia Negative    Neisseria gonorrhea Negative   Wet prep, genital   Collection Time: 10/02/16  7:14 PM  Result Value Ref Range   Yeast Wet Prep HPF POC NONE SEEN NONE SEEN   Trich, Wet Prep PRESENT (A) NONE SEEN   Clue Cells Wet Prep HPF POC NONE SEEN NONE SEEN   WBC, Wet Prep HPF POC MANY (A) NONE SEEN   Sperm NONE SEEN   Urinalysis, Routine w reflex microscopic   Collection Time: 10/02/16  8:31 PM  Result Value Ref Range   Color, Urine YELLOW YELLOW   APPearance CLOUDY (A) CLEAR   Specific Gravity, Urine 1.023 1.005 - 1.030   pH 6.0 5.0 - 8.0   Glucose, UA 150 (A) NEGATIVE mg/dL   Hgb urine dipstick SMALL (A) NEGATIVE   Bilirubin Urine NEGATIVE NEGATIVE   Ketones, ur NEGATIVE NEGATIVE mg/dL   Protein, ur 30 (A) NEGATIVE mg/dL   Nitrite NEGATIVE NEGATIVE   Leukocytes, UA LARGE (A) NEGATIVE   RBC / HPF TOO NUMEROUS TO COUNT 0 - 5 RBC/hpf   WBC, UA TOO NUMEROUS TO COUNT 0 - 5 WBC/hpf   Bacteria, UA FEW (A) NONE SEEN   Squamous Epithelial / LPF 6-30 (A) NONE SEEN   WBC Clumps PRESENT  Mucus PRESENT   Type and screen [facility]   Collection Time: 10/02/16  9:53 PM  Result Value Ref Range   ABO/RH(D) A POS    Antibody Screen NEG    Sample Expiration 10/05/2016   CBC on admission   Collection Time: 10/02/16  9:54 PM  Result Value Ref Range   WBC 11.1 (H) 4.0 - 10.5 K/uL   RBC 3.55 (L) 3.87 - 5.11 MIL/uL   Hemoglobin 10.8 (L) 12.0 - 15.0 g/dL    HCT 40.9 (L) 81.1 - 46.0 %   MCV 87.3 78.0 - 100.0 fL   MCH 30.4 26.0 - 34.0 pg   MCHC 34.8 30.0 - 36.0 g/dL   RDW 91.4 78.2 - 95.6 %   Platelets 293 150 - 400 K/uL  RPR   Collection Time: 10/02/16  9:54 PM  Result Value Ref Range   RPR Ser Ql Non Reactive Non Reactive  Glucose, fasting   Collection Time: 10/02/16  9:55 PM  Result Value Ref Range   Glucose, Fasting 88 65 - 99 mg/dL  Glucose tolerance, 1 hour (Glucola)   Collection Time: 10/02/16 11:00 PM  Result Value Ref Range   Glucose, 1 Hour GTT 133 70 - 140 mg/dL  Glucose, 2 hour   Collection Time: 10/02/16 11:53 PM  Result Value Ref Range   Glucose, 2 hour 134 70 - 139 mg/dL    Discharge Condition: Stable  Disposition: 01-Home or Self Care   Discharge Instructions    Discharge activity:  Bathroom / Shower only    Complete by:  As directed    Discharge activity: Bedrest    Complete by:  As directed    Discharge diet:  No restrictions    Complete by:  As directed    Do not have sex or do anything that might make you have an orgasm    Complete by:  As directed    Fetal Kick Count:  Lie on our left side for one hour after a meal, and count the number of times your baby kicks.  If it is less than 5 times, get up, move around and drink some juice.  Repeat the test 30 minutes later.  If it is still less than 5 kicks in an hour, notify your doctor.    Complete by:  As directed      Allergies as of 10/04/2016   No Known Allergies     Medication List    TAKE these medications   metroNIDAZOLE 500 MG tablet Commonly known as:  FLAGYL Take 1 tablet (500 mg total) by mouth every 12 (twelve) hours.   prenatal multivitamin Tabs tablet Take 1 tablet by mouth daily at 12 noon.            Discharge Care Instructions        Start     Ordered   10/04/16 0000  metroNIDAZOLE (FLAGYL) 500 MG tablet  Every 12 hours     10/04/16 1008   10/04/16 0000  Prenatal Vit-Fe Fumarate-FA (PRENATAL MULTIVITAMIN) TABS tablet  Daily      10/04/16 1008   10/04/16 0000  Fetal Kick Count:  Lie on our left side for one hour after a meal, and count the number of times your baby kicks.  If it is less than 5 times, get up, move around and drink some juice.  Repeat the test 30 minutes later.  If it is still less than 5 kicks in an hour, notify your  doctor.     10/04/16 1008   10/04/16 0000  Discharge activity: Bedrest     10/04/16 1008   10/04/16 0000  Discharge activity:  Bathroom / Shower only     10/04/16 1008   10/04/16 0000  Discharge diet:  No restrictions     10/04/16 1008   10/04/16 0000  Do not have sex or do anything that might make you have an orgasm     10/04/16 1008     Follow-up Information    [facility] Follow up in 2 week(s).   Specialty:  Obstetrics and Gynecology Why:  New OB appointment--they will call you with an appointment Contact information: [address] [place] [address] [phone]          Signed: Reva Bores M.D. 10/04/2016, 10:10 AM

## 2016-10-04 NOTE — Discharge Instructions (Signed)

## 2016-10-04 NOTE — Progress Notes (Signed)
CSW attempted to meet with patient to complete an assessment for homeless and adoption plan. When CSW arrived, patient was resting in bed and [name] was laying on the couch.  CSW asked [name] to leave the room in effort to meet with patient in private.  Patient requested [name] not to leave the room and informed CSW that patient did not want to meet with CSW.  CSW explained CSW's role and attempted to offer patient community resources. Patient and [name] declined and requested CSW to leave the room.  CSW informed the couple that if patient deliverys at [facility] CSW will meet with patient after delivery to establish care an resources for patient and baby.  CSW provided patient with CSW's contact information and encouraged patient to call if a need arise.   There are no barriers to d/c.  Blaine Hamper, MSW, LCSW Clinical Social Work [phone]

## 2016-10-24 ENCOUNTER — Encounter: Payer: Self-pay | Admitting: *Deleted

## 2016-11-02 ENCOUNTER — Inpatient Hospital Stay (HOSPITAL_COMMUNITY)
Admission: AD | Admit: 2016-11-02 | Discharge: 2016-11-02 | Disposition: A | Discharge status: Home / Self Care | Payer: Medicaid Other | Source: Ambulatory Visit | Attending: Obstetrics and Gynecology | Admitting: Obstetrics and Gynecology

## 2016-11-02 ENCOUNTER — Encounter (HOSPITAL_COMMUNITY): Payer: Self-pay

## 2016-11-02 DIAGNOSIS — Z3A35 35 weeks gestation of pregnancy: Secondary | ICD-10-CM | POA: Insufficient documentation

## 2016-11-02 DIAGNOSIS — O26893 Other specified pregnancy related conditions, third trimester: Secondary | ICD-10-CM | POA: Diagnosis present

## 2016-11-02 DIAGNOSIS — O219 Vomiting of pregnancy, unspecified: Secondary | ICD-10-CM

## 2016-11-02 DIAGNOSIS — O0933 Supervision of pregnancy with insufficient antenatal care, third trimester: Secondary | ICD-10-CM

## 2016-11-02 DIAGNOSIS — R531 Weakness: Secondary | ICD-10-CM | POA: Diagnosis present

## 2016-11-02 DIAGNOSIS — R1030 Lower abdominal pain, unspecified: Secondary | ICD-10-CM | POA: Insufficient documentation

## 2016-11-02 DIAGNOSIS — Z5900 Homelessness unspecified: Secondary | ICD-10-CM

## 2016-11-02 LAB — WET PREP, GENITAL
CLUE CELLS WET PREP: NONE SEEN
Sperm: NONE SEEN
Trich, Wet Prep: NONE SEEN
Yeast Wet Prep HPF POC: NONE SEEN

## 2016-11-02 LAB — COMPREHENSIVE METABOLIC PANEL
ALT: 17 U/L (ref 14–54)
AST: 26 U/L (ref 15–41)
Albumin: 2.8 g/dL — ABNORMAL LOW (ref 3.5–5.0)
Alkaline Phosphatase: 178 U/L — ABNORMAL HIGH (ref 38–126)
Anion gap: 7 (ref 5–15)
BILIRUBIN TOTAL: 0.9 mg/dL (ref 0.3–1.2)
BUN: 7 mg/dL (ref 6–20)
CO2: 22 mmol/L (ref 22–32)
CREATININE: 0.52 mg/dL (ref 0.44–1.00)
Calcium: 8.2 mg/dL — ABNORMAL LOW (ref 8.9–10.3)
Chloride: 105 mmol/L (ref 101–111)
Glucose, Bld: 90 mg/dL (ref 65–99)
POTASSIUM: 3.7 mmol/L (ref 3.5–5.1)
Sodium: 134 mmol/L — ABNORMAL LOW (ref 135–145)
TOTAL PROTEIN: 6.3 g/dL — AB (ref 6.5–8.1)

## 2016-11-02 LAB — CBC WITH DIFFERENTIAL/PLATELET
BASOS ABS: 0 10*3/uL (ref 0.0–0.1)
Basophils Relative: 0 %
EOS PCT: 2 %
Eosinophils Absolute: 0.2 10*3/uL (ref 0.0–0.7)
HEMATOCRIT: 30 % — AB (ref 36.0–46.0)
Hemoglobin: 10.4 g/dL — ABNORMAL LOW (ref 12.0–15.0)
LYMPHS ABS: 2.5 10*3/uL (ref 0.7–4.0)
LYMPHS PCT: 32 %
MCH: 30.1 pg (ref 26.0–34.0)
MCHC: 34.7 g/dL (ref 30.0–36.0)
MCV: 86.7 fL (ref 78.0–100.0)
MONO ABS: 0.5 10*3/uL (ref 0.1–1.0)
Monocytes Relative: 7 %
NEUTROS ABS: 4.4 10*3/uL (ref 1.7–7.7)
Neutrophils Relative %: 59 %
PLATELETS: 218 10*3/uL (ref 150–400)
RBC: 3.46 MIL/uL — ABNORMAL LOW (ref 3.87–5.11)
RDW: 13.5 % (ref 11.5–15.5)
WBC: 7.6 10*3/uL (ref 4.0–10.5)

## 2016-11-02 LAB — URINALYSIS, ROUTINE W REFLEX MICROSCOPIC
BILIRUBIN URINE: NEGATIVE
GLUCOSE, UA: NEGATIVE mg/dL
HGB URINE DIPSTICK: NEGATIVE
KETONES UR: NEGATIVE mg/dL
Nitrite: NEGATIVE
PROTEIN: NEGATIVE mg/dL
Specific Gravity, Urine: 1.017 (ref 1.005–1.030)
pH: 5 (ref 5.0–8.0)

## 2016-11-02 MED ORDER — METOCLOPRAMIDE HCL 10 MG PO TABS: 10.0000 mg | ORAL_TABLET | Freq: Four times a day (QID) | ORAL | 0 refills | Status: DC

## 2016-11-02 NOTE — MAU Note (Cosign Needed)
History     CSN: [id number]  Arrival date and time: 02/25/13 0847   First Provider Initiated Contact with Patient 02/25/13 1017      Chief Complaint  Patient presents with  . Abdominal Pain   HPI  Cassidy Underwood is a 21 y/o G3P0020 with EGA [redacted]w[redacted]d who presents to the MAU for evaluation of moderate mid epigastric and bilateral suprapubic abdominal pain x 5-6 days. Pain has been constant since onset and waxes and wanes. Describes pain as sharp and pressure. Denies vaginal bleeding or discharge, or dysuria. Admits to dizziness and describes several episodes where "I feel dizzy, see spots, and feel like falling out" however she has not passed out. Has had occasional headaches. She reports eating and drinking has been good. Patient reports regular bowel movements daily and urinating without problems. Per chart review, patient was admitted here at [facility] on 8/20 for pretern contractions, treated with tocolysis with magnesium sulfate x 24 hours. Wet prep showed trich which she was given Rx for flagyl 500 mg PO BID x 5 days. Patient states she took two doses and then lost the rest.   Patient is currently homeless and does not have prenatal care. [name] at bedside.   Past Medical History  Diagnosis Date  . Fibroid     Past Surgical History  Procedure Laterality Date  . Breast surgery      left side had cyst, non cancerous  . Oophorectomy      unsure of which side, had cyst and needed ovary removed    Family History  Problem Relation Age of Onset  . Cancer Sister 33    breast  . Cancer Paternal Grandmother     breast    History  Substance Use Topics  . Smoking status: Current Every Day Smoker -- 0.50 packs/day for 25 years    Types: Cigarettes  . Smokeless tobacco: Never Used  . Alcohol Use: No    Allergies: No Known Allergies  No prescriptions prior to admission    ROS Constitutional: denies fever, chills  Head: denies headache Eyes: denies visual disturbances  Abdomen: admits to  abdominal pain, nausea, denies vomiting, constipation, diarrhea  GU: denies vaginal discharge, dysuria  Neuro: admits to dizziness, denies syncope   Physical Exam   Blood pressure 150/67, pulse 81, temperature 98.5 F (36.9 C), temperature source Oral, resp. rate 18, height  (1.626 m), weight 79.198 kg (174 lb 9.6 oz), last menstrual period 11/01/2012.  Physical Exam Constitutional: thin, well appearing female in no acute distress lying in hospital bed Head: normocephalic, atraumatic  Cardiac: RRR, no murmurs, rubs or gallops  Respiratory: effort and breath sounds normal  Abdomen: soft, gravid, mild epigastric abdominal pain, mild right suprapubic abdominal pain, no guarding  GU: Cervical os closed, positive cervical motion tenderness, no blood in vaginal vault  Neurologic: alert and oriented to person, place and time Psychiatric: normal mood and affect    MAU Course  Procedures Results for orders placed or performed during the hospital encounter of 11/02/16 (from the past 24 hour(s))  Urinalysis, Routine w reflex microscopic     Status: Abnormal   Collection Time: 11/02/16  2:20 PM  Result Value Ref Range   Color, Urine YELLOW YELLOW   APPearance CLEAR CLEAR   Specific Gravity, Urine 1.017 1.005 - 1.030   pH 5.0 5.0 - 8.0   Glucose, UA NEGATIVE NEGATIVE mg/dL   Hgb urine dipstick NEGATIVE NEGATIVE   Bilirubin Urine NEGATIVE NEGATIVE   Ketones,  ur NEGATIVE NEGATIVE mg/dL   Protein, ur NEGATIVE NEGATIVE mg/dL   Nitrite NEGATIVE NEGATIVE   Leukocytes, UA SMALL (A) NEGATIVE   RBC / HPF 0-5 0 - 5 RBC/hpf   WBC, UA 0-5 0 - 5 WBC/hpf   Bacteria, UA RARE (A) NONE SEEN   Squamous Epithelial / LPF 0-5 (A) NONE SEEN   Mucus PRESENT   Wet prep, genital     Status: Abnormal   Collection Time: 11/02/16  5:00 PM  Result Value Ref Range   Yeast Wet Prep HPF POC NONE SEEN NONE SEEN   Trich, Wet Prep NONE SEEN NONE SEEN   Clue Cells Wet Prep HPF POC NONE SEEN NONE SEEN   WBC, Wet  Prep HPF POC MODERATE (A) NONE SEEN   Sperm NONE SEEN   CBC with Differential/Platelet     Status: Abnormal   Collection Time: 11/02/16  5:00 PM  Result Value Ref Range   WBC 7.6 4.0 - 10.5 K/uL   RBC 3.46 (L) 3.87 - 5.11 MIL/uL   Hemoglobin 10.4 (L) 12.0 - 15.0 g/dL   HCT 56.2 (L) 13.0 - 86.5 %   MCV 86.7 78.0 - 100.0 fL   MCH 30.1 26.0 - 34.0 pg   MCHC 34.7 30.0 - 36.0 g/dL   RDW 78.4 69.6 - 29.5 %   Platelets 218 150 - 400 K/uL   Neutrophils Relative % 59 %   Neutro Abs 4.4 1.7 - 7.7 K/uL   Lymphocytes Relative 32 %   Lymphs Abs 2.5 0.7 - 4.0 K/uL   Monocytes Relative 7 %   Monocytes Absolute 0.5 0.1 - 1.0 K/uL   Eosinophils Relative 2 %   Eosinophils Absolute 0.2 0.0 - 0.7 K/uL   Basophils Relative 0 %   Basophils Absolute 0.0 0.0 - 0.1 K/uL  Comprehensive metabolic panel     Status: Abnormal   Collection Time: 11/02/16  5:00 PM  Result Value Ref Range   Sodium 134 (L) 135 - 145 mmol/L   Potassium 3.7 3.5 - 5.1 mmol/L   Chloride 105 101 - 111 mmol/L   CO2 22 22 - 32 mmol/L   Glucose, Bld 90 65 - 99 mg/dL   BUN 7 6 - 20 mg/dL   Creatinine, Ser 2.84 0.44 - 1.00 mg/dL   Calcium 8.2 (L) 8.9 - 10.3 mg/dL   Total Protein 6.3 (L) 6.5 - 8.1 g/dL   Albumin 2.8 (L) 3.5 - 5.0 g/dL   AST 26 15 - 41 U/L   ALT 17 14 - 54 U/L   Alkaline Phosphatase 178 (H) 38 - 126 U/L   Total Bilirubin 0.9 0.3 - 1.2 mg/dL   GFR calc non Af Amer >60 >60 mL/min   GFR calc Af Amer >60 >60 mL/min   Anion gap 7 5 - 15    MDM Wet prep negative Hgb 10.4 HCT 30  Intrapartum fetal heart tone monitoring remained reassuring during length of stay. No leaking of fluid and no bleeding. Cervical exam was unremarkable. Patient deemed stable for discharge to home without outpatient follow-up secondary to homelessness.   Assessment and Plan  1. Abdominal pain in pregnancy   Discharge from MAU Return as needed or if symptoms do not improve.   Gerilyn Pilgrim Cimolino, PA-S 11/02/2016 17:57 hours  I confirm that  I have verified the information documented in the student physician assistant's note and that I have also personally reperformed the physical exam and all medical decision making activities.See my MAU note.  Raelyn Mora, CNM 11/02/2016 8:00 PM

## 2016-11-02 NOTE — MAU Note (Signed)
Pt C/O lower abd pain for the last 4-5 days, feels very nauseated, no vomiting.  C/O weakness, "my whole body is shutting down."  Also C/O back pain.  Denies bleeding or LOF.

## 2016-11-02 NOTE — MAU Provider Note (Signed)
History     CSN: [id number]  Arrival date and time: 11/02/16 1422   First Provider Initiated Contact with Patient 11/02/16 1618      Chief Complaint  Patient presents with  . Abdominal Pain  . Back Pain  . Nausea   HPI  [name] is a 21 y.o. G3P0020 at [redacted]w[redacted]d gestation presenting to MAU with complaints of "feeling weak, like her body is shutting down", lower abd pain x 4-5 days, very nauseated - no vomiting, and back pain.  Denies VB or LOF.  Past Medical History:  Diagnosis Date  . Abscess     Past Surgical History:  Procedure Laterality Date  . DILATION AND CURETTAGE OF UTERUS    . DILATION AND EVACUATION N/A 11/09/2014   Procedure: DILATATION AND EVACUATION;  Surgeon: Levie Heritage, DO;  Location: [facility];  Service: Gynecology;  Laterality: N/A;    Family History  Problem Relation Age of Onset  . Adopted: Yes  . Family history unknown: Yes    Social History  Substance Use Topics  . Smoking status: Never Smoker  . Smokeless tobacco: Never Used  . Alcohol use No     Comment: use in past    Allergies: No Known Allergies  No prescriptions prior to admission.    Review of Systems  HENT: Negative.   Eyes: Negative.   Respiratory: Negative.   Cardiovascular: Negative.   Gastrointestinal: Positive for abdominal pain and nausea. Negative for vomiting.  Endocrine: Negative.   Genitourinary: Negative.   Musculoskeletal: Negative.   Skin: Negative.   Allergic/Immunologic: Negative.   Neurological: Positive for headaches.  Hematological: Negative.   Psychiatric/Behavioral: Negative.    Physical Exam   Blood pressure 105/65, pulse 92, temperature 97.8 F (36.6 C), temperature source Oral, resp. rate 18.  Physical Exam  Nursing note and vitals reviewed. Constitutional: She is oriented to person, place, and time. She appears well-developed and well-nourished.  HENT:  Head: Normocephalic.  Eyes: Pupils are equal, round, and reactive to light.  Neck:  Normal range of motion.  Cardiovascular: Normal rate, regular rhythm and normal heart sounds.   Respiratory: Effort normal and breath sounds normal.  GI: Soft. Bowel sounds are normal.  Genitourinary:  Genitourinary Comments: Dilation: Fingertip Effacement (%): Thick Cervical Position: Posterior Station: Ballotable Presentation: Undeterminable Exam by:: Carloyn Jaeger, CNM // wet prep self-collected, gc/ct on urine  Musculoskeletal: Normal range of motion.  Neurological: She is alert and oriented to person, place, and time.  Skin: Skin is warm and dry.  Psychiatric: She has a normal mood and affect. Her behavior is normal. Judgment and thought content normal.    MAU Course  Procedures  MDM GBS - pending  Results for orders placed or performed during the hospital encounter of 11/02/16 (from the past 24 hour(s))  Urinalysis, Routine w reflex microscopic     Status: Abnormal   Collection Time: 11/02/16  2:20 PM  Result Value Ref Range   Color, Urine YELLOW YELLOW   APPearance CLEAR CLEAR   Specific Gravity, Urine 1.017 1.005 - 1.030   pH 5.0 5.0 - 8.0   Glucose, UA NEGATIVE NEGATIVE mg/dL   Hgb urine dipstick NEGATIVE NEGATIVE   Bilirubin Urine NEGATIVE NEGATIVE   Ketones, ur NEGATIVE NEGATIVE mg/dL   Protein, ur NEGATIVE NEGATIVE mg/dL   Nitrite NEGATIVE NEGATIVE   Leukocytes, UA SMALL (A) NEGATIVE   RBC / HPF 0-5 0 - 5 RBC/hpf   WBC, UA 0-5 0 - 5 WBC/hpf  Bacteria, UA RARE (A) NONE SEEN   Squamous Epithelial / LPF 0-5 (A) NONE SEEN   Mucus PRESENT   Wet prep, genital     Status: Abnormal   Collection Time: 11/02/16  5:00 PM  Result Value Ref Range   Yeast Wet Prep HPF POC NONE SEEN NONE SEEN   Trich, Wet Prep NONE SEEN NONE SEEN   Clue Cells Wet Prep HPF POC NONE SEEN NONE SEEN   WBC, Wet Prep HPF POC MODERATE (A) NONE SEEN   Sperm NONE SEEN   CBC with Differential/Platelet     Status: Abnormal   Collection Time: 11/02/16  5:00 PM  Result Value Ref Range   WBC 7.6 4.0  - 10.5 K/uL   RBC 3.46 (L) 3.87 - 5.11 MIL/uL   Hemoglobin 10.4 (L) 12.0 - 15.0 g/dL   HCT 16.1 (L) 09.6 - 04.5 %   MCV 86.7 78.0 - 100.0 fL   MCH 30.1 26.0 - 34.0 pg   MCHC 34.7 30.0 - 36.0 g/dL   RDW 40.9 81.1 - 91.4 %   Platelets 218 150 - 400 K/uL   Neutrophils Relative % 59 %   Neutro Abs 4.4 1.7 - 7.7 K/uL   Lymphocytes Relative 32 %   Lymphs Abs 2.5 0.7 - 4.0 K/uL   Monocytes Relative 7 %   Monocytes Absolute 0.5 0.1 - 1.0 K/uL   Eosinophils Relative 2 %   Eosinophils Absolute 0.2 0.0 - 0.7 K/uL   Basophils Relative 0 %   Basophils Absolute 0.0 0.0 - 0.1 K/uL  Comprehensive metabolic panel     Status: Abnormal   Collection Time: 11/02/16  5:00 PM  Result Value Ref Range   Sodium 134 (L) 135 - 145 mmol/L   Potassium 3.7 3.5 - 5.1 mmol/L   Chloride 105 101 - 111 mmol/L   CO2 22 22 - 32 mmol/L   Glucose, Bld 90 65 - 99 mg/dL   BUN 7 6 - 20 mg/dL   Creatinine, Ser 7.82 0.44 - 1.00 mg/dL   Calcium 8.2 (L) 8.9 - 10.3 mg/dL   Total Protein 6.3 (L) 6.5 - 8.1 g/dL   Albumin 2.8 (L) 3.5 - 5.0 g/dL   AST 26 15 - 41 U/L   ALT 17 14 - 54 U/L   Alkaline Phosphatase 178 (H) 38 - 126 U/L   Total Bilirubin 0.9 0.3 - 1.2 mg/dL   GFR calc non Af Amer >60 >60 mL/min   GFR calc Af Amer >60 >60 mL/min   Anion gap 7 5 - 15    Assessment and Plan  Weakness - Stay well-hydrated - Reassurance given that sx is probably from not eating as often as needed  Nausea and vomiting during pregnancy - Rx for Reglan 10 mg po every 6 hrs   No prenatal care in current pregnancy in third trimester - Establish PNC with [facility] in 1 wk; message sent to admin pool - GBS obtained - pending  Discharge home Patient verbalized an understanding of the plan of care and agrees.  Raelyn Mora, MSN, CNM 11/02/2016, 4:30 PM

## 2016-11-04 LAB — CULTURE, BETA STREP (GROUP B ONLY)

## 2016-11-08 ENCOUNTER — Encounter: Payer: Self-pay | Admitting: Obstetrics & Gynecology

## 2016-11-08 ENCOUNTER — Institutional Professional Consult (permissible substitution): Payer: Self-pay

## 2016-11-08 ENCOUNTER — Inpatient Hospital Stay (HOSPITAL_COMMUNITY): Payer: Medicaid Other | Admitting: Anesthesiology

## 2016-11-08 ENCOUNTER — Inpatient Hospital Stay (HOSPITAL_COMMUNITY)
Admission: AD | Admit: 2016-11-08 | Discharge: 2016-11-10 | DRG: 775 | Disposition: A | Discharge status: Home / Self Care | Payer: Medicaid Other | Source: Ambulatory Visit | Attending: Family Medicine | Admitting: Family Medicine

## 2016-11-08 ENCOUNTER — Encounter (HOSPITAL_COMMUNITY): Payer: Self-pay | Admitting: *Deleted

## 2016-11-08 ENCOUNTER — Inpatient Hospital Stay (HOSPITAL_COMMUNITY): Payer: Medicaid Other

## 2016-11-08 DIAGNOSIS — Z3A36 36 weeks gestation of pregnancy: Secondary | ICD-10-CM

## 2016-11-08 DIAGNOSIS — F1490 Cocaine use, unspecified, uncomplicated: Secondary | ICD-10-CM | POA: Diagnosis present

## 2016-11-08 DIAGNOSIS — Z5900 Homelessness unspecified: Secondary | ICD-10-CM

## 2016-11-08 DIAGNOSIS — O99324 Drug use complicating childbirth: Secondary | ICD-10-CM | POA: Diagnosis present

## 2016-11-08 DIAGNOSIS — F1290 Cannabis use, unspecified, uncomplicated: Secondary | ICD-10-CM | POA: Diagnosis present

## 2016-11-08 DIAGNOSIS — O219 Vomiting of pregnancy, unspecified: Secondary | ICD-10-CM

## 2016-11-08 DIAGNOSIS — R531 Weakness: Secondary | ICD-10-CM

## 2016-11-08 DIAGNOSIS — O365990 Maternal care for other known or suspected poor fetal growth, unspecified trimester, not applicable or unspecified: Secondary | ICD-10-CM

## 2016-11-08 DIAGNOSIS — O0933 Supervision of pregnancy with insufficient antenatal care, third trimester: Secondary | ICD-10-CM

## 2016-11-08 LAB — RAPID URINE DRUG SCREEN, HOSP PERFORMED
AMPHETAMINES: NOT DETECTED
BARBITURATES: NOT DETECTED
BENZODIAZEPINES: NOT DETECTED
COCAINE: POSITIVE — AB
Opiates: NOT DETECTED
Tetrahydrocannabinol: POSITIVE — AB

## 2016-11-08 LAB — WET PREP, GENITAL
CLUE CELLS WET PREP: NONE SEEN
Sperm: NONE SEEN
Trich, Wet Prep: NONE SEEN
Yeast Wet Prep HPF POC: NONE SEEN

## 2016-11-08 LAB — CBC
HEMATOCRIT: 32.7 % — AB (ref 36.0–46.0)
HEMOGLOBIN: 11.2 g/dL — AB (ref 12.0–15.0)
MCH: 29.2 pg (ref 26.0–34.0)
MCHC: 34.3 g/dL (ref 30.0–36.0)
MCV: 85.4 fL (ref 78.0–100.0)
Platelets: 274 10*3/uL (ref 150–400)
RBC: 3.83 MIL/uL — ABNORMAL LOW (ref 3.87–5.11)
RDW: 13.4 % (ref 11.5–15.5)
WBC: 16.9 10*3/uL — ABNORMAL HIGH (ref 4.0–10.5)

## 2016-11-08 LAB — GC/CHLAMYDIA PROBE AMP (~~LOC~~) NOT AT ARMC
CHLAMYDIA, DNA PROBE: NEGATIVE
Neisseria Gonorrhea: NEGATIVE

## 2016-11-08 LAB — TYPE AND SCREEN
ABO/RH(D): A POS
Antibody Screen: NEGATIVE

## 2016-11-08 LAB — HEPATITIS B SURFACE ANTIGEN: Hepatitis B Surface Ag: NEGATIVE

## 2016-11-08 LAB — POCT FERN TEST: POCT Fern Test: NEGATIVE

## 2016-11-08 LAB — RPR: RPR Ser Ql: NONREACTIVE

## 2016-11-08 LAB — HIV ANTIBODY (ROUTINE TESTING W REFLEX): HIV SCREEN 4TH GENERATION: NONREACTIVE

## 2016-11-08 MED ORDER — ZOLPIDEM TARTRATE 5 MG PO TABS: 5.0000 mg | ORAL_TABLET | Freq: Every evening | ORAL | Status: DC | PRN

## 2016-11-08 MED ORDER — SIMETHICONE 80 MG PO CHEW: 80.0000 mg | CHEWABLE_TABLET | ORAL | Status: DC | PRN

## 2016-11-08 MED ORDER — SENNOSIDES-DOCUSATE SODIUM 8.6-50 MG PO TABS
2.0000 | ORAL_TABLET | ORAL | Status: DC
  Filled 2016-11-08: qty 2

## 2016-11-08 MED ORDER — WITCH HAZEL-GLYCERIN EX PADS: 1.0000 "application " | MEDICATED_PAD | CUTANEOUS | Status: DC | PRN

## 2016-11-08 MED ORDER — ONDANSETRON HCL 4 MG PO TABS: 4.0000 mg | ORAL_TABLET | ORAL | Status: DC | PRN

## 2016-11-08 MED ORDER — FLEET ENEMA 7-19 GM/118ML RE ENEM: 1.0000 | ENEMA | Freq: Every day | RECTAL | Status: DC | PRN

## 2016-11-08 MED ORDER — ACETAMINOPHEN 325 MG PO TABS: 650.0000 mg | ORAL_TABLET | ORAL | Status: DC | PRN

## 2016-11-08 MED ORDER — OXYTOCIN 40 UNITS IN LACTATED RINGERS INFUSION - SIMPLE MED
2.5000 [IU]/h | INTRAVENOUS | Status: DC
  Filled 2016-11-08: qty 1000

## 2016-11-08 MED ORDER — BENZOCAINE-MENTHOL 20-0.5 % EX AERO
1.0000 "application " | INHALATION_SPRAY | CUTANEOUS | Status: DC | PRN
  Administered 2016-11-08: 1 via TOPICAL
  Filled 2016-11-08: qty 56

## 2016-11-08 MED ORDER — DIPHENHYDRAMINE HCL 50 MG/ML IJ SOLN: 12.5000 mg | INTRAMUSCULAR | Status: DC | PRN

## 2016-11-08 MED ORDER — EPHEDRINE 5 MG/ML INJ
10.0000 mg | INTRAVENOUS | Status: DC | PRN
  Filled 2016-11-08: qty 2

## 2016-11-08 MED ORDER — PHENYLEPHRINE 40 MCG/ML (10ML) SYRINGE FOR IV PUSH (FOR BLOOD PRESSURE SUPPORT)
80.0000 ug | PREFILLED_SYRINGE | INTRAVENOUS | Status: DC | PRN
  Filled 2016-11-08: qty 5

## 2016-11-08 MED ORDER — FENTANYL CITRATE (PF) 100 MCG/2ML IJ SOLN
50.0000 ug | Freq: Once | INTRAMUSCULAR | Status: AC
  Administered 2016-11-08: 50 ug via INTRAVENOUS
  Filled 2016-11-08: qty 2

## 2016-11-08 MED ORDER — GENTAMICIN SULFATE 40 MG/ML IJ SOLN
2.0000 mg/kg | Freq: Three times a day (TID) | INTRAVENOUS | Status: DC
  Administered 2016-11-08: 100 mg via INTRAVENOUS
  Filled 2016-11-08 (×2): qty 2.5

## 2016-11-08 MED ORDER — FENTANYL 2.5 MCG/ML BUPIVACAINE 1/10 % EPIDURAL INFUSION (WH - ANES)
14.0000 mL/h | INTRAMUSCULAR | Status: DC | PRN
  Administered 2016-11-08: 14 mL/h via EPIDURAL
  Filled 2016-11-08: qty 100

## 2016-11-08 MED ORDER — DIPHENHYDRAMINE HCL 25 MG PO CAPS: 25.0000 mg | ORAL_CAPSULE | Freq: Four times a day (QID) | ORAL | Status: DC | PRN

## 2016-11-08 MED ORDER — ONDANSETRON HCL 4 MG/2ML IJ SOLN: 4.0000 mg | INTRAMUSCULAR | Status: DC | PRN

## 2016-11-08 MED ORDER — SODIUM CHLORIDE 0.9 % IV SOLN
2.0000 g | Freq: Four times a day (QID) | INTRAVENOUS | Status: DC
  Administered 2016-11-08: 2 g via INTRAVENOUS
  Filled 2016-11-08 (×2): qty 2000

## 2016-11-08 MED ORDER — LACTATED RINGERS IV SOLN: 500.0000 mL | Freq: Once | INTRAVENOUS | Status: DC

## 2016-11-08 MED ORDER — LACTATED RINGERS IV BOLUS (SEPSIS)
500.0000 mL | Freq: Once | INTRAVENOUS | Status: AC
  Administered 2016-11-08: 500 mL via INTRAVENOUS

## 2016-11-08 MED ORDER — ACETAMINOPHEN 325 MG PO TABS
650.0000 mg | ORAL_TABLET | ORAL | Status: DC | PRN
  Filled 2016-11-08: qty 2

## 2016-11-08 MED ORDER — BUTORPHANOL TARTRATE 1 MG/ML IJ SOLN
1.0000 mg | Freq: Once | INTRAMUSCULAR | Status: AC
  Administered 2016-11-08: 1 mg via INTRAVENOUS
  Filled 2016-11-08: qty 1

## 2016-11-08 MED ORDER — OXYCODONE-ACETAMINOPHEN 5-325 MG PO TABS
1.0000 | ORAL_TABLET | ORAL | Status: DC | PRN
Start: 2016-11-08 — End: 2016-11-08

## 2016-11-08 MED ORDER — LACTATED RINGERS IV SOLN: 500.0000 mL | INTRAVENOUS | Status: DC | PRN

## 2016-11-08 MED ORDER — PHENYLEPHRINE 40 MCG/ML (10ML) SYRINGE FOR IV PUSH (FOR BLOOD PRESSURE SUPPORT)
80.0000 ug | PREFILLED_SYRINGE | INTRAVENOUS | Status: DC | PRN
  Filled 2016-11-08: qty 5
  Filled 2016-11-08: qty 10

## 2016-11-08 MED ORDER — LACTATED RINGERS IV SOLN
INTRAVENOUS | Status: DC
  Administered 2016-11-08 (×2): via INTRAVENOUS

## 2016-11-08 MED ORDER — IBUPROFEN 600 MG PO TABS
600.0000 mg | ORAL_TABLET | Freq: Four times a day (QID) | ORAL | Status: DC
  Administered 2016-11-08 – 2016-11-09 (×2): 600 mg via ORAL
  Filled 2016-11-08 (×4): qty 1

## 2016-11-08 MED ORDER — LIDOCAINE HCL (PF) 1 % IJ SOLN
30.0000 mL | INTRAMUSCULAR | Status: DC | PRN
  Filled 2016-11-08: qty 30

## 2016-11-08 MED ORDER — COCONUT OIL OIL: 1.0000 "application " | TOPICAL_OIL | Status: DC | PRN

## 2016-11-08 MED ORDER — ONDANSETRON HCL 4 MG/2ML IJ SOLN: 4.0000 mg | Freq: Four times a day (QID) | INTRAMUSCULAR | Status: DC | PRN

## 2016-11-08 MED ORDER — PRENATAL MULTIVITAMIN CH
1.0000 | ORAL_TABLET | Freq: Every day | ORAL | Status: DC
  Administered 2016-11-09: 1 via ORAL
  Filled 2016-11-08: qty 1

## 2016-11-08 MED ORDER — LIDOCAINE HCL (PF) 1 % IJ SOLN
INTRAMUSCULAR | Status: DC | PRN
  Administered 2016-11-08: 13 mL via EPIDURAL

## 2016-11-08 MED ORDER — SOD CITRATE-CITRIC ACID 500-334 MG/5ML PO SOLN: 30.0000 mL | ORAL | Status: DC | PRN

## 2016-11-08 MED ORDER — TETANUS-DIPHTH-ACELL PERTUSSIS 5-2.5-18.5 LF-MCG/0.5 IM SUSP: 0.5000 mL | Freq: Once | INTRAMUSCULAR | Status: DC

## 2016-11-08 MED ORDER — OXYTOCIN BOLUS FROM INFUSION
500.0000 mL | Freq: Once | INTRAVENOUS | Status: AC
  Administered 2016-11-08: 500 mL/h via INTRAVENOUS

## 2016-11-08 MED ORDER — OXYCODONE-ACETAMINOPHEN 5-325 MG PO TABS
2.0000 | ORAL_TABLET | ORAL | Status: DC | PRN
Start: 2016-11-08 — End: 2016-11-08

## 2016-11-08 MED ORDER — DIBUCAINE 1 % RE OINT: 1.0000 "application " | TOPICAL_OINTMENT | RECTAL | Status: DC | PRN

## 2016-11-08 NOTE — Progress Notes (Signed)
CSW received consult for homelessness and possible adoption plan and was initially informed by staff that patient and [name] have changed there mind and wish to parent baby.  CSW asked RN to ask patient if she wished to speak with CSW prior to delivery or after, respecting the fact that labor is a private and sensitive situation.  CSW was told that patient declines to meet with CSW at this time.

## 2016-11-08 NOTE — H&P (Signed)
Cassidy Underwood is a 21 y.o. female G53P0020 with IUP at [redacted]w[redacted]d presenting for contractions. Pt states she has been having irregular, every 5 minutes contractions, associated with none vaginal bleeding for 4 hours..  Membranes are intact, with active fetal movement.    Patient has had no Prenatal care except for Antepartum admission on 8/20 for tocolysis and betamethasone and an MAU visit on 11/02/2016 for weakness and dizziness.   Patient had previously talked about giving baby up for adoption. She currently lives in a tent in the woods.   Prenatal History/Complications: Possible fetal growth restriction; growth at 28 weeks was in 47%; growth at 36 weeks at 16%. AC at 28 weeks was 23%, now 7%.   Past Medical History: Past Medical History:  Diagnosis Date  . Abscess     Past Surgical History: Past Surgical History:  Procedure Laterality Date  . DILATION AND CURETTAGE OF UTERUS    . DILATION AND EVACUATION N/A 11/09/2014   Procedure: DILATATION AND EVACUATION;  Surgeon: Levie Heritage, DO;  Location: [facility];  Service: Gynecology;  Laterality: N/A;    Obstetrical History: OB History    Gravida Para Term Preterm AB Living   3       2     SAB TAB Ectopic Multiple Live Births   1   1           Social History: Social History   Social History  . Marital status: Single    Spouse name: N/A  . Number of children: N/A  . Years of education: N/A   Social History Main Topics  . Smoking status: Never Smoker  . Smokeless tobacco: Never Used  . Alcohol use No     Comment: use in past  . Drug use: No  . Sexual activity: Yes    Birth control/ protection: None   Other Topics Concern  . None   Social History Narrative  . None    Family History: Family History  Problem Relation Age of Onset  . Adopted: Yes  . Family history unknown: Yes    Allergies: No Known Allergies  Prescriptions Prior to Admission  Medication Sig Dispense Refill Last Dose  . metoCLOPramide (REGLAN) 10  MG tablet Take 1 tablet (10 mg total) by mouth every 6 (six) hours. 30 tablet 0   . Prenatal Vit-Fe Fumarate-FA (PRENATAL MULTIVITAMIN) TABS tablet Take 1 tablet by mouth daily at 12 noon. 90 tablet 2 NONE        Review of Systems   Constitutional: Negative for fever and chills Eyes: Negative for visual disturbances Respiratory: Negative for shortness of breath, dyspnea Cardiovascular: Negative for chest pain or palpitations  Gastrointestinal: Negative for vomiting, diarrhea and constipation.  POSITIVE for abdominal pain (contractions) Genitourinary: Negative for dysuria and urgency Musculoskeletal: Negative for back pain, joint pain, myalgias  Neurological: Negative for dizziness and headaches      Blood pressure 111/66, pulse 86, temperature 98.1 F (36.7 C), temperature source Oral, resp. rate 17, unknown if currently breastfeeding. General appearance: appears older than stated age Lungs: clear to auscultation bilaterally Heart: regular rate and rhythm Abdomen: soft, non-tender; bowel sounds normal Extremities: Homans sign is negative, no sign of DVT DTR's 2+ Presentation: cephalic Fetal monitoring  Baseline: 150 bpm; moderate variabillity, no acels, no decels, ctx q 4-5  Uterine activity  Intensity: strong Dilation: 5 Effacement (%): 100 Exam by:: Luna Kitchens, CNM   Prenatal labs: ABO, Rh: --/--/A POS (09/26 0335) Antibody: NEG (09/26 0335)  Rubella: !Error! pending RPR: Non Reactive (08/20 2154)  HBsAg:   pending HIV:   pending GBS:   negative 1 hr Glucola NA Genetic screening  NA Anatomy US    Prenatal Transfer Tool  Maternal Diabetes: No Unknown Genetic Screening: Declined Maternal Ultrasounds/Referrals: Declined Fetal Ultrasounds or other Referrals:  Other:  anatomy scan at 28 weeks showed growth in 47th percentile and AC at 23 percentile; anatomy scan at 36 weeks showed growth now in 16% and AC at 7%.  Maternal Substance Abuse:  No Significant  Maternal Medications:  None Significant Maternal Lab Results: None     Results for orders placed or performed during the hospital encounter of 11/08/16 (from the past 24 hour(s))  Wet prep, genital   Collection Time: 11/08/16  3:00 AM  Result Value Ref Range   Yeast Wet Prep HPF POC NONE SEEN NONE SEEN   Trich, Wet Prep NONE SEEN NONE SEEN   Clue Cells Wet Prep HPF POC NONE SEEN NONE SEEN   WBC, Wet Prep HPF POC FEW (A) NONE SEEN   Sperm NONE SEEN   CBC   Collection Time: 11/08/16  3:26 AM  Result Value Ref Range   WBC 16.9 (H) 4.0 - 10.5 K/uL   RBC 3.83 (L) 3.87 - 5.11 MIL/uL   Hemoglobin 11.2 (L) 12.0 - 15.0 g/dL   HCT 40.9 (L) 81.1 - 91.4 %   MCV 85.4 78.0 - 100.0 fL   MCH 29.2 26.0 - 34.0 pg   MCHC 34.3 30.0 - 36.0 g/dL   RDW 78.2 95.6 - 21.3 %   Platelets 274 150 - 400 K/uL  Type and screen [facility]   Collection Time: 11/08/16  3:35 AM  Result Value Ref Range   ABO/RH(D) A POS    Antibody Screen NEG    Sample Expiration 11/11/2016   Fern Test   Collection Time: 11/08/16  6:49 AM  Result Value Ref Range   POCT Fern Test Negative = intact amniotic membranes     Assessment: Cassidy Underwood is a 21 y.o. G3P0020 with an IUP at [redacted]w[redacted]d presenting for contractions. Possible FGR.   Plan: #Labor: expectant management #Pain:  Per request #FWB Cat 1 #ID: GBS: negative #MOF:  unknown #MOC: unknown #Circ: unknown   Marylene Land CNM 11/08/2016, 7:18 AM

## 2016-11-08 NOTE — MAU Note (Signed)
PT  HAS ARRIVED  VIA  EMS -  SAYS  [facility]  AT 0200- .   NO PNC.  - BUT  SAYS HAD AN U/S HERE AT 28 WEEKS    LAST SEX- YESTERDAY

## 2016-11-08 NOTE — BH Specialist Note (Deleted)
Integrated Behavioral Health Initial Visit  MRN: [id number] Name: Bess Saltzman  Number of Integrated Behavioral Health Clinician visits:: 1/6 Session Start time: ***  Session End time: *** Total time: {IBH Total Time:21014050}  Type of Service: Integrated Behavioral Health- Individual/Family Interpretor:No. Interpretor Name and Language: n/a   Warm Hand Off Completed.       SUBJECTIVE: Nikitia Asbill is a 21 y.o. female accompanied by {CHL AMB ACCOMPANIED WU:9811914782} Patient was referred by *** for ***. Patient reports the following symptoms/concerns: *** Duration of problem: ***; Severity of problem: {Mild/Moderate/Severe:20260}  OBJECTIVE: Mood: {BHH MOOD:22306} and Affect: {BHH AFFECT:22307} Risk of harm to self or others: {CHL AMB BH Suicide Current Mental Status:21022748}  LIFE CONTEXT: Family and Social: *** School/Work: *** Self-Care: *** Life Changes: Current pregnancy ***  GOALS ADDRESSED: Patient will: 1. Reduce symptoms of: {IBH Symptoms:21014056} 2. Increase knowledge and/or ability of: {IBH Patient Tools:21014057}  3. Demonstrate ability to: {IBH Goals:21014053}  INTERVENTIONS: Interventions utilized: {IBH Interventions:21014054}  Standardized Assessments completed: GAD-7 and PHQ 9  ASSESSMENT: Patient currently experiencing ***.   Patient may benefit from ***.  PLAN: 1. Follow up with behavioral health clinician on : *** 2. Behavioral recommendations: *** 3. Referral(s): {IBH Referrals:21014055} 4. "From scale of 1-10, how likely are you to follow plan?": ***  Asim Gersten C Jazper Nikolai, LCSWA
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
## 2016-11-08 NOTE — Progress Notes (Signed)
Labor Progress Note  S: Patient seen & examined for progress of labor. Patient comfortable with epidural.   O: BP 116/65   Pulse 88   Temp 98.7 F (37.1 C) (Oral)   Resp 15   Ht  (1.422 m)   Wt 49 kg (108 lb)   LMP  (Approximate) Comment: based on ultrasound 10/03/16  BMI 24.21 kg/m   FHT: 140bpm, mod var, +accels, no decels TOCO: q2-10min, patient looks comfortable during contractions  CVE: Dilation: 8 Effacement (%): 100 Cervical Position: Anterior Station: +1 Presentation: Vertex Exam by:: Dr Nira Retort  A&P: 21 y.o. [id number] [redacted]w[redacted]d here for SOL. Cocaine and THC positive on UDS this am Social work has seen; awaiting their note Continue expectant management Anticipate SVD  Lezlie Octave, MD FM Resident PGY-1 11/08/2016 1:56 PM

## 2016-11-08 NOTE — Progress Notes (Signed)
ANTIBIOTIC CONSULT NOTE - INITIAL  Pharmacy Consult for Gentamicin Indication: Chorioamnionitis   No Known Allergies  Patient Measurements: Height:  (142.2 cm) Weight: 108 lb (49 kg) IBW/kg (Calculated) : 36.3 Adjusted Body Weight: 40.1  Vital Signs: Temp: 102.7 F (39.3 C) (09/26 1600) Temp Source: Oral (09/26 1600) BP: 110/63 (09/26 1600) Pulse Rate: 105 (09/26 1600)  Labs:  Recent Labs  11/08/16 0326  WBC 16.9*  HGB 11.2*  PLT 274   No results for input(s): GENTTROUGH, GENTPEAK, GENTRANDOM in the last 72 hours.   Microbiology: Recent Results (from the past 720 hour(s))  Wet prep, genital     Status: Abnormal   Collection Time: 11/02/16  5:00 PM  Result Value Ref Range Status   Yeast Wet Prep HPF POC NONE SEEN NONE SEEN Final   Trich, Wet Prep NONE SEEN NONE SEEN Final   Clue Cells Wet Prep HPF POC NONE SEEN NONE SEEN Final   WBC, Wet Prep HPF POC MODERATE (A) NONE SEEN Final    Comment: MANY BACTERIA SEEN   Sperm NONE SEEN  Final  Culture, beta strep (group b only)     Status: None   Collection Time: 11/02/16  5:44 PM  Result Value Ref Range Status   Specimen Description VAGINAL/RECTAL  Final   Special Requests NONE  Final   Culture   Final    NO GROUP B STREP (S.AGALACTIAE) ISOLATED Performed at [facility], [address], [place], [place] [address]    Report Status 11/04/2016 FINAL  Final  Wet prep, genital     Status: Abnormal   Collection Time: 11/08/16  3:00 AM  Result Value Ref Range Status   Yeast Wet Prep HPF POC NONE SEEN NONE SEEN Final   Trich, Wet Prep NONE SEEN NONE SEEN Final   Clue Cells Wet Prep HPF POC NONE SEEN NONE SEEN Final   WBC, Wet Prep HPF POC FEW (A) NONE SEEN Final    Comment: MODERATE BACTERIA SEEN   Sperm NONE SEEN  Final    Medications:  ampicillin  Assessment: 21 y.o. female G3P0020 at [redacted]w[redacted]d  Estimated Ke = 0.344, Vd = 16 L  Goal of Therapy:  Gentamicin peak 6-8 mg/L and Trough < 1 mg/L  Plan:    Gentamicin 100 mg IV every 8 hrs  Check Scr with next labs if gentamicin continued. Will check gentamicin levels if continued > 72hr or clinically indicated.  Drusilla Kanner 11/08/2016,4:26 PM

## 2016-11-08 NOTE — Anesthesia Postprocedure Evaluation (Signed)
Anesthesia Post Note  Patient: Cassidy Underwood  Procedure(s) Performed: * No procedures listed *     Patient location during evaluation: Mother Baby Anesthesia Type: Epidural Level of consciousness: awake and alert and oriented Pain management: pain level controlled Vital Signs Assessment: post-procedure vital signs reviewed and stable Respiratory status: spontaneous breathing and nonlabored ventilation Cardiovascular status: stable Postop Assessment: no headache, patient able to bend at knees, no backache, no apparent nausea or vomiting, epidural receding and adequate PO intake Anesthetic complications: no    Last Vitals:  Vitals:   11/08/16 1830 11/08/16 1858  BP: 103/84 134/73  Pulse: 98 98  Resp: 18 18  Temp: (!) 38.2 C 37.3 C    Last Pain:  Vitals:   11/08/16 1858  TempSrc: Axillary  PainSc: 0-No pain   Pain Goal:                 Reva Pinkley Hristova

## 2016-11-08 NOTE — Anesthesia Preprocedure Evaluation (Signed)
Anesthesia Evaluation  Patient identified by MRN, date of birth, ID band Patient awake    Reviewed: Allergy & Precautions, NPO status , Patient's Chart, lab work & pertinent test results  Airway Mallampati: II  TM Distance: >3 FB Neck ROM: Full    Dental no notable dental hx. (+) Teeth Intact   Pulmonary asthma ,    Pulmonary exam normal breath sounds clear to auscultation       Cardiovascular negative cardio ROS Normal cardiovascular exam Rhythm:Regular Rate:Normal     Neuro/Psych negative neurological ROS  negative psych ROS   GI/Hepatic negative GI ROS, Neg liver ROS,   Endo/Other  negative endocrine ROS  Renal/GU negative Renal ROS  negative genitourinary   Musculoskeletal negative musculoskeletal ROS (+)   Abdominal   Peds  Hematology negative hematology ROS (+)   Anesthesia Other Findings   Reproductive/Obstetrics (+) Pregnancy Missed Ab                             Anesthesia Physical  Anesthesia Plan  ASA: II  Anesthesia Plan: Epidural   Post-op Pain Management:    Induction:   PONV Risk Score and Plan:   Airway Management Planned: Natural Airway  Additional Equipment:   Intra-op Plan:   Post-operative Plan:   Informed Consent: I have reviewed the patients History and Physical, chart, labs and discussed the procedure including the risks, benefits and alternatives for the proposed anesthesia with the patient or authorized representative who has indicated his/her understanding and acceptance.     Plan Discussed with: CRNA, Anesthesiologist and Surgeon  Anesthesia Plan Comments:         Anesthesia Quick Evaluation

## 2016-11-08 NOTE — Anesthesia Pain Management Evaluation Note (Signed)
  CRNA Pain Management Visit Note  Patient: Levora Werden, 21 y.o., female  "Hello I am a member of the anesthesia team at [facility]. We have an anesthesia team available at all times to provide care throughout the hospital, including epidural management and anesthesia for C-section. I don't know your plan for the delivery whether it a natural birth, water birth, IV sedation, nitrous supplementation, doula or epidural, but we want to meet your pain goals."   1.Was your pain managed to your expectations on prior hospitalizations?   No prior hospitalizations  2.What is your expectation for pain management during this hospitalization?     Epidural  3.How can we help you reach that goal? epidural  Record the patient's initial score and the patient's pain goal.   Pain: 0  Pain Goal: 4 [facility] wants you to be able to say your pain was always managed very well.  Madison Hickman 11/08/2016

## 2016-11-08 NOTE — Progress Notes (Signed)
LABOR PROGRESS NOTE  Cassidy Underwood is a 21 y.o. G3P0020 at [redacted]w[redacted]d  admitted for PTL  Subjective: Patient doing well, comfortable with epidural  Objective: BP 116/65   Pulse 88   Temp 98.7 F (37.1 C) (Oral)   Resp 15   Ht  (1.422 m)   Wt 108 lb (49 kg)   LMP  (Approximate) Comment: based on ultrasound 10/03/16  BMI 24.21 kg/m  or  Vitals:   11/08/16 1131 11/08/16 1230 11/08/16 1300 11/08/16 1330  BP: (!) 80/45 117/65 113/67 116/65  Pulse: 87 96 96 88  Resp: Temp:      TempSrc:      Weight:      Height:        SVE: Dilation: 8 Effacement (%): 100 Cervical Position: Anterior Station: +1 Presentation: Vertex Exam by:: Dr Nira Retort FHT: baseline rate 140, moderate varibility, +acel, variable decel Toco: cta q3-4 min  Assessment / Plan: 21 y.o. G3P0020 at [redacted]w[redacted]d here for PTL  Labor: continue expectant management  Fetal Wellbeing:  Cat II Pain Control:  epidural Anticipated MOD:  SVD  Frederik Pear, MD 11/08/2016, 1:56 PM

## 2016-11-08 NOTE — Progress Notes (Signed)
NICU updated.  Will attend for delivery.
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
## 2016-11-08 NOTE — Anesthesia Procedure Notes (Signed)
Epidural Patient location during procedure: OB Start time: 11/08/2016 8:06 AM End time: 11/08/2016 8:21 AM  Staffing Anesthesiologist: Anitra Lauth RAY Performed: anesthesiologist   Preanesthetic Checklist Completed: patient identified, site marked, surgical consent, pre-op evaluation, timeout performed, IV checked, risks and benefits discussed and monitors and equipment checked  Epidural Patient position: sitting Prep: ChloraPrep Patient monitoring: heart rate, cardiac monitor, continuous pulse ox and blood pressure Approach: midline Location: L2-L3 Injection technique: LOR saline  Needle:  Needle type: Tuohy  Needle gauge: 17 G Needle length: 9 cm Needle insertion depth: 4 cm Catheter type: closed end flexible Catheter size: 20 Guage Catheter at skin depth: 7 cm Test dose: negative  Assessment Events: blood not aspirated, injection not painful, no injection resistance, negative IV test and no paresthesia  Additional Notes Reason for block:procedure for pain

## 2016-11-08 NOTE — Progress Notes (Signed)
Social Services at bedside for a consult.

## 2016-11-09 LAB — CBC
HCT: 30 % — ABNORMAL LOW (ref 36.0–46.0)
Hemoglobin: 10.3 g/dL — ABNORMAL LOW (ref 12.0–15.0)
MCH: 29.3 pg (ref 26.0–34.0)
MCHC: 34.3 g/dL (ref 30.0–36.0)
MCV: 85.5 fL (ref 78.0–100.0)
Platelets: 248 10*3/uL (ref 150–400)
RBC: 3.51 MIL/uL — AB (ref 3.87–5.11)
RDW: 13.4 % (ref 11.5–15.5)
WBC: 16 10*3/uL — ABNORMAL HIGH (ref 4.0–10.5)

## 2016-11-09 LAB — RUBELLA SCREEN: RUBELLA: 2.95 {index} (ref >0.99)

## 2016-11-09 NOTE — Progress Notes (Signed)
Post Partum Day 1  Subjective:  Cassidy Underwood is a 21 y.o. G3P0020 [redacted]w[redacted]d s/p NSVD.  No acute events overnight.  Pt denies problems with ambulating, voiding or po intake.  She denies nausea or vomiting.  Pain is well controlled.  She has had flatus. She has not had bowel movement.  Lochia Moderate.  Plan for birth control is undecided.  Method of Feeding: none - plans for baby to be adopted  Objective: BP (!) 115/52 (BP Location: Left Arm)   Pulse 73   Temp 98.3 F (36.8 C) (Oral)   Resp 18   Ht  (1.422 m)   Wt 49 kg (108 lb)   LMP  (Approximate) Comment: based on ultrasound 10/03/16  BMI 24.21 kg/m   Physical Exam:  General: alert, cooperative and no distress Lochia:normal flow Chest: no increased work of breathing Abdomen: soft, nontender, fundus firm at/below umbilicus Uterine Fundus: firm DVT Evaluation: No evidence of DVT seen on physical exam. Extremities: No edema   Recent Labs  11/08/16 0326 11/09/16 0517  HGB 11.2* 10.3*  HCT 32.7* 30.0*    Assessment/Plan:  ASSESSMENT: Cassidy Underwood is a 21 y.o. G3P0020 [redacted]w[redacted]d ppd #1 s/p NSVD doing well.   Plan for discharge tomorrow  Social work saw patient yesterday and confirmed that patient is planning on baby's adoption.  Adoptive parents are planning to arrive today.  Patient would like to go home tonight, but given her unstable living situation, I told her we would plan for her to stay until tomorrow but that we may be able to discharge her today if that turned out to be the best plan for her.   LOS: 1 day   Amanda C. Frances Furbish, MD PGY-1, [facility] 11/09/2016 7:31 AM  OB FELLOW POSTPARTUM PROGRESS NOTE ATTESTATION  I have seen and examined this patient and agree with above documentation in the resident's note.   Frederik Pear, MD OB Fellow 11:02 AM

## 2016-11-09 NOTE — Progress Notes (Signed)
CSW continues to follow closely.  Attorney plans to arrive at approximately 6pm to obtain signatures on consent to adopt. 

## 2016-11-09 NOTE — Plan of Care (Signed)
Problem: Activity: Goal: Ability to tolerate increased activity will improve Outcome: Completed/Met Date Met: 11/09/16 Walked outside independently  Problem: Education: Goal: Knowledge of condition will improve Outcome: Completed/Met Date Met: 11/09/16 Understands to report increased bleeding, increased pain, signs of infection, and signs of HTN  Problem: Coping: Goal: Ability to cope will improve Outcome: Adequate for Discharge Patient has chosen to not leave the situation she is currently in even though patient has been given options

## 2016-11-09 NOTE — Progress Notes (Signed)
CSW met with [name] and [name] at [facility] request.  When CSW arrived, [name] was laying in bed, [name] was in the recliner, adopting parents were on the couch, and bedside nurse was present. [name] gave CSW permission to meet with [name] while guest were present.  [name] wanted to confirm that adopting parents will be able to get a room with infant once adoption documents are signed.  CSW confirmed that the adopting parents will be allowed to have a room after nursing staff receive signed document from [name]'s attorney.   [name] also asked CSW if he could meet with CSW in private.  CSW met with [name] in the surgical meeting room.  [name] voiced concerns regarding confidentiality being breeched and concerns about being treated unfairly by previous CSW. CSW was understanding and explained to [name] that the compliant will need to come from patient "[name]" at this time; [name] did not have any complaints when CSW met with [name].   CSW updated Naval architect of Nursing.  Laurey Arrow, MSW, LCSW Clinical Social Work [phone]
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
## 2016-11-09 NOTE — Progress Notes (Signed)
CSW called Attorney B. Delford Field to inquire about his plan to meet with parents to complete adoption paperwork.  He states he needs to hear from the biological parents to ensure they want his representation before he can move forward with the case.  CSW spoke with Jen/[employer]opt [facility] who states adoptive parents are here and they have now decided to move forward with [place] law, not [place] law. CSW asked Engineer, materials to stand outside room while CSW attempted to clear [name]'s room to speak with her privately.  [name] reports that she is "tired" and asked if CSW could return later.  We have agreed that CSW will return at 1pm to speak with [name] privately.   CSW quickly returned to the room due to forgetting to ask [name] if she has contacted Mr. Delford Field to represent her.  [name] had left the room.  [name] states "Jr ([name]) took care of all that."  CSW informed [name] that Mr. Delford Field has informed CSW that he needs [name] to contact him before he can continue with the process.  CSW provided [name] with Mr. Lynelle Doctor phone number. CSW will return at 1pm. CSW spoke with pediatrician to update regarding ongoing concerns.

## 2016-11-10 MED ORDER — PRENATAL MULTIVITAMIN CH: 1.0000 | ORAL_TABLET | Freq: Every day | ORAL | 0 refills | Status: DC

## 2016-11-10 MED ORDER — MEDROXYPROGESTERONE ACETATE 150 MG/ML IM SUSP: 150.0000 mg | Freq: Once | INTRAMUSCULAR | Status: DC

## 2016-11-10 MED ORDER — IBUPROFEN 600 MG PO TABS: 600.0000 mg | ORAL_TABLET | Freq: Four times a day (QID) | ORAL | 0 refills | Status: DC

## 2016-11-10 MED ORDER — ACETAMINOPHEN 325 MG PO TABS: 650.0000 mg | ORAL_TABLET | ORAL | 0 refills | Status: DC | PRN

## 2016-11-10 MED ORDER — NORGESTIMATE-ETH ESTRADIOL 0.25-35 MG-MCG PO TABS: 1.0000 | ORAL_TABLET | Freq: Every day | ORAL | 3 refills | Status: DC

## 2016-11-10 NOTE — Progress Notes (Addendum)
CSW attempted to meet with [name] to provide resources and referrals for outpatient counseling, however, [name] was discharged.    CSW also contacted attorney Brinton Wright office after noticing the incorrect spelling of [name]'s name on the HIPAA Authorization document and the Authorization to Consent for Health Care For Minor.  CSW had to leave a message; CSW requested a return call.    12:30pm Brinton Wright returned CSW's call and reported that the documents will be corrected and will be faxed over to the hospital, however, the document that is "necessary" and correct is the Consent To Adoption document.   CSW also consulted with Hospital's Legal team and was informed that incorrect name spelling on documents will not interfere with adoption proceeding.   Leam Madero Boyd-Gilyard, MSW, LCSW Clinical Social Work [phone] 
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
## 2016-11-10 NOTE — Progress Notes (Signed)
At 2240 this RN attempted to give pt scheduled Motrin pt refused. Pt called the nurses' station around 0200 stating she "needs something for pain." Upon arriving to the room the patient tell this RN "I need something stronger than that Tylenol." This RN asked pt to rate and describe her pain and patient states "I don't know my stomach just hurts." This RN offered to give pt Tylenol and Motrin together and she agreed but states "I need them crushed because I cant swallow pills." [name] turns and says "she has a bad gag reflex and I'm not crushing her pills for her any more that's their job." This RN crushed the pills using a pill crusher. Upon returning to the room pt states "Jr. ([name]) said I can't take those together because it's not good." Pt educated on medication and this RN offered to give meds with some time in between. Pt request this RN leave the meds on the bedside table "I'll take them later because he is making me upset." Informed patient that the meds could not be left at the bedside. Patient decides she will call again once she "calms down" and decides if she is going to take the meds.
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
## 2016-12-15 ENCOUNTER — Ambulatory Visit: Payer: Self-pay | Admitting: Medical

## 2017-01-24 ENCOUNTER — Encounter (HOSPITAL_COMMUNITY): Payer: Self-pay

## 2017-01-24 DIAGNOSIS — N939 Abnormal uterine and vaginal bleeding, unspecified: Secondary | ICD-10-CM | POA: Diagnosis not present

## 2017-01-24 NOTE — ED Triage Notes (Signed)
Pt is alert and oriented x 4 and is verbally responsive. Pt reports that she had a baby Sept 27, 2018 and began having scant bleeding that has progressed more the past 3 weeks.  Pt reports that she has been having vaginal bleeding and reports changing her pad every hour and bleeding through pants regardless of tampon.  Pt reports that she has been passing clots. Pt also reports that she has been having lower abdominal  5/10 that is cramping.

## 2017-01-25 ENCOUNTER — Emergency Department (HOSPITAL_COMMUNITY)
Admission: EM | Admit: 2017-01-25 | Discharge: 2017-01-25 | Discharge status: Against Medical Advice | Payer: Medicaid Other | Attending: Emergency Medicine | Admitting: Emergency Medicine

## 2017-01-25 DIAGNOSIS — N939 Abnormal uterine and vaginal bleeding, unspecified: Secondary | ICD-10-CM

## 2017-01-25 LAB — BASIC METABOLIC PANEL
ANION GAP: 7 (ref 5–15)
BUN: 12 mg/dL (ref 6–20)
CALCIUM: 9.1 mg/dL (ref 8.9–10.3)
CO2: 26 mmol/L (ref 22–32)
Chloride: 104 mmol/L (ref 101–111)
Creatinine, Ser: 0.6 mg/dL (ref 0.44–1.00)
GFR calc Af Amer: >60 mL/min (ref >60)
GFR calc non Af Amer: >60 mL/min (ref >60)
GLUCOSE: 106 mg/dL — AB (ref 65–99)
POTASSIUM: 3.2 mmol/L — AB (ref 3.5–5.1)
Sodium: 137 mmol/L (ref 135–145)

## 2017-01-25 LAB — CBC WITH DIFFERENTIAL/PLATELET
BASOS ABS: 0 10*3/uL (ref 0.0–0.1)
Basophils Relative: 0 %
EOS PCT: 4 %
Eosinophils Absolute: 0.3 10*3/uL (ref 0.0–0.7)
HCT: 33.7 % — ABNORMAL LOW (ref 36.0–46.0)
Hemoglobin: 11.2 g/dL — ABNORMAL LOW (ref 12.0–15.0)
LYMPHS ABS: 3.7 10*3/uL (ref 0.7–4.0)
LYMPHS PCT: 46 %
MCH: 28.3 pg (ref 26.0–34.0)
MCHC: 33.2 g/dL (ref 30.0–36.0)
MCV: 85.1 fL (ref 78.0–100.0)
MONO ABS: 0.5 10*3/uL (ref 0.1–1.0)
Monocytes Relative: 6 %
NEUTROS ABS: 3.5 10*3/uL (ref 1.7–7.7)
Neutrophils Relative %: 44 %
PLATELETS: 349 10*3/uL (ref 150–400)
RBC: 3.96 MIL/uL (ref 3.87–5.11)
RDW: 13.8 % (ref 11.5–15.5)
WBC: 8 10*3/uL (ref 4.0–10.5)

## 2017-01-25 LAB — I-STAT BETA HCG BLOOD, ED (MC, WL, AP ONLY): I-stat hCG, quantitative: <5 m[IU]/mL (ref <5)

## 2017-01-25 NOTE — ED Provider Notes (Signed)
[facility]-EMERGENCY DEPT Provider Note   CSN: [id number] Arrival date & time: 01/24/17  1944     History   Chief Complaint Chief Complaint  Patient presents with  . Vaginal Bleeding  . Abdominal Pain    HPI Cassidy Underwood is a 21 y.o. female.  The history is provided by the patient and medical records.    21 y.o. G1P0 almost 3 months post-partum SVD without complications, presenting to the ED with vaginal bleeding.  Patient reports she has had intermittent bleeding since delivery on 11/09/16.  States over the past 3 days became increasingly more heavy.  She reports soaking through pads and tampons during the night to the point it soaks her sheets with blood.  She has passed some larger dark red clots as well.  States there is an odor but unsure of discharge.  She is sexually active.  States she was supposed to have GYN follow-up after delivery but never went.  Past Medical History:  Diagnosis Date  . Abscess     Patient Active Problem List   Diagnosis Date Noted  . Pregnancy affected by fetal growth restriction 11/08/2016  . SVD (spontaneous vaginal delivery) 11/08/2016  . Homeless 11/02/2016  . Weakness 11/02/2016  . Nausea and vomiting during pregnancy 11/02/2016  . Preterm contractions 10/02/2016  . No prenatal care in current pregnancy in third trimester 10/02/2016    Past Surgical History:  Procedure Laterality Date  . DILATION AND CURETTAGE OF UTERUS    . DILATION AND EVACUATION N/A 11/09/2014   Procedure: DILATATION AND EVACUATION;  Surgeon: Levie HeritageJacob J Stinson, DO;  Location: [facility];  Service: Gynecology;  Laterality: N/A;    OB History    Gravida Para Term Preterm AB Living   3       2     SAB TAB Ectopic Multiple Live Births   1   1           Home Medications    Prior to Admission medications   Medication Sig Start Date End Date Taking? Authorizing Provider  acetaminophen (TYLENOL) 325 MG tablet Take 2 tablets (650 mg total) by mouth  every 4 (four) hours as needed (for pain scale < 4). 11/10/16   Winfrey, Harlen LabsAmanda C, MD  ibuprofen (ADVIL,MOTRIN) 600 MG tablet Take 1 tablet (600 mg total) by mouth every 6 (six) hours. 11/10/16   Lennox SoldersWinfrey, Amanda C, MD  norgestimate-ethinyl estradiol (SPRINTEC 28) 0.25-35 MG-MCG tablet Take 1 tablet by mouth daily. DO NOT START until October 21st. 12/03/16   Arabella MerlesShaw, Kimberly D, CNM  Prenatal Vit-Fe Fumarate-FA (PRENATAL MULTIVITAMIN) TABS tablet Take 1 tablet by mouth daily at 12 noon. 11/10/16   Lennox SoldersWinfrey, Amanda C, MD    Family History Family History  Adopted: Yes  Family history unknown: Yes    Social History Social History   Tobacco Use  . Smoking status: Never Smoker  . Smokeless tobacco: Never Used  Substance Use Topics  . Alcohol use: No    Comment: use in past  . Drug use: No     Allergies   Patient has no known allergies.   Review of Systems Review of Systems  Genitourinary: Positive for vaginal bleeding.  All other systems reviewed and are negative.    Physical Exam Updated Vital Signs BP 112/70 (BP Location: Right Arm)   Pulse 77   Temp 98.3 F (36.8 C) (Oral)   Resp 18   Ht 4\' 9"  (1.448 m)   Wt 44.5 kg (  98 lb)   LMP  (Approximate) Comment: based on ultrasound 10/03/16  SpO2 100%   BMI 21.21 kg/m   Physical Exam  Constitutional: She is oriented to person, place, and time. She appears well-developed and well-nourished.  HENT:  Head: Normocephalic and atraumatic.  Mouth/Throat: Oropharynx is clear and moist.  Eyes: Conjunctivae and EOM are normal. Pupils are equal, round, and reactive to light.  Neck: Normal range of motion.  Cardiovascular: Normal rate, regular rhythm and normal heart sounds.  Pulmonary/Chest: Effort normal and breath sounds normal.  Abdominal: Soft. Bowel sounds are normal. There is no tenderness. There is no rigidity and no guarding.  Musculoskeletal: Normal range of motion.  Neurological: She is alert and oriented to person, place, and  time.  Skin: Skin is warm and dry.  Psychiatric: She has a normal mood and affect.  Nursing note and vitals reviewed.    ED Treatments / Results  Labs (all labs ordered are listed, but only abnormal results are displayed) Labs Reviewed  CBC WITH DIFFERENTIAL/PLATELET - Abnormal; Notable for the following components:      Result Value   Hemoglobin 11.2 (*)    HCT 33.7 (*)    All other components within normal limits  BASIC METABOLIC PANEL - Abnormal; Notable for the following components:   Potassium 3.2 (*)    Glucose, Bld 106 (*)    All other components within normal limits  WET PREP, GENITAL  I-STAT BETA HCG BLOOD, ED ([facility], [facility], [facility] ONLY)  GC/CHLAMYDIA PROBE AMP ([facility]) NOT AT [facility]    EKG  EKG Interpretation None       Radiology No results found.  Procedures Procedures (including critical care time)  Medications Ordered in ED Medications - No data to display   Initial Impression / Assessment and Plan / ED Course  I have reviewed the triage vital signs and the nursing notes.  Pertinent labs & imaging results that were available during my care of the patient were reviewed by me and considered in my medical decision making (see chart for details).  21 year old female here with heavy vaginal bleeding.  She is almost 3 months postpartum spontaneous vaginal delivery without noted complications.  She has not had any OB/GYN follow-up since delivery.  Reports heavy bleeding with passage of clots.  Reports odor but unsure of discharge.  Patient is hemodynamically stable here.  Abdomen is soft and benign.  Will start workup with screening labs, pelvic exam.  May ultimately need ultrasound which I have discussed with her.  1:46 AM Notified that patient has changed her mind and does not want work-up.   She wants to leave. Patient left the ED before I could make it back to her room to talk with her.  She did have some labs that were already sent down to lab.  Final Clinical  Impressions(s) / ED Diagnoses   Final diagnoses:  Vaginal bleeding    ED Discharge Orders    None       Garlon HatchetSanders, Nash Bolls M, PA-C 01/25/17 0239    Palumbo, April, MD 01/25/17 [phone]

## 2017-02-13 NOTE — L&D Delivery Note (Addendum)
TC from RN @ [phone] to come for delivery, TC @ [phone] & [phone] to report that baby delivered precipitously in the bed by Lysle Rubens, RN -- provider to come for delivery of placenta.  Upon arrival to room, a vigorous female infant was in the infant warmer being assessed by L&D staff.  Delivery Note At 6:50 AM a viable female was delivered via  (Presentation: OA).  APGAR: 9/9; weight 4 lbs 15 oz.   Placenta status: manual removal intact via [place] by Dr. Earlene Plater.  Cord: 3VC with the following complications: cord avulsion with gentle cord traction prior to delivery of placenta. Failed attempt x 2 to manually extract placenta by CNM. At which time, CNM requested Dr. Earlene Plater to come for manual extraction of placenta. Cord pH: none  Anesthesia: epidural Episiotomy: None Lacerations: None Suture Repair: n/a Est. Blood Loss (mL): 533  Mom to postpartum.  Baby to Couplet care / Skin to Skin. Baby is up for adoption -- [name] will notify adoptive parents about delivery  Raelyn Mora, MSN, CNM 11/13/17, 7:20 AM

## 2017-02-27 ENCOUNTER — Encounter (HOSPITAL_COMMUNITY): Payer: Self-pay

## 2017-04-05 ENCOUNTER — Emergency Department (HOSPITAL_COMMUNITY)
Admission: EM | Admit: 2017-04-05 | Discharge: 2017-04-05 | Disposition: A | Discharge status: Home / Self Care | Payer: Medicaid Other | Attending: Emergency Medicine | Admitting: Emergency Medicine

## 2017-04-05 ENCOUNTER — Encounter (HOSPITAL_COMMUNITY): Payer: Self-pay | Admitting: Emergency Medicine

## 2017-04-05 ENCOUNTER — Other Ambulatory Visit: Payer: Self-pay

## 2017-04-05 DIAGNOSIS — R112 Nausea with vomiting, unspecified: Secondary | ICD-10-CM

## 2017-04-05 LAB — CBC
HEMATOCRIT: 35.2 % — AB (ref 36.0–46.0)
HEMOGLOBIN: 11.6 g/dL — AB (ref 12.0–15.0)
MCH: 28.5 pg (ref 26.0–34.0)
MCHC: 33 g/dL (ref 30.0–36.0)
MCV: 86.5 fL (ref 78.0–100.0)
Platelets: 365 10*3/uL (ref 150–400)
RBC: 4.07 MIL/uL (ref 3.87–5.11)
RDW: 14 % (ref 11.5–15.5)
WBC: 9 10*3/uL (ref 4.0–10.5)

## 2017-04-05 LAB — URINALYSIS, ROUTINE W REFLEX MICROSCOPIC
BILIRUBIN URINE: NEGATIVE
Bacteria, UA: NONE SEEN
GLUCOSE, UA: NEGATIVE mg/dL
HGB URINE DIPSTICK: NEGATIVE
KETONES UR: NEGATIVE mg/dL
NITRITE: NEGATIVE
PH: 5 (ref 5.0–8.0)
Protein, ur: NEGATIVE mg/dL
Specific Gravity, Urine: 1.028 (ref 1.005–1.030)

## 2017-04-05 LAB — COMPREHENSIVE METABOLIC PANEL
ALBUMIN: 3.5 g/dL (ref 3.5–5.0)
ALK PHOS: 47 U/L (ref 38–126)
ALT: 16 U/L (ref 14–54)
ANION GAP: 9 (ref 5–15)
AST: 21 U/L (ref 15–41)
BILIRUBIN TOTAL: 0.3 mg/dL (ref 0.3–1.2)
BUN: 9 mg/dL (ref 6–20)
CALCIUM: 8.6 mg/dL — AB (ref 8.9–10.3)
CO2: 22 mmol/L (ref 22–32)
Chloride: 106 mmol/L (ref 101–111)
Creatinine, Ser: 0.51 mg/dL (ref 0.44–1.00)
GFR calc non Af Amer: >60 mL/min (ref >60)
GLUCOSE: 92 mg/dL (ref 65–99)
POTASSIUM: 3.7 mmol/L (ref 3.5–5.1)
Sodium: 137 mmol/L (ref 135–145)
TOTAL PROTEIN: 6.5 g/dL (ref 6.5–8.1)

## 2017-04-05 LAB — I-STAT BETA HCG BLOOD, ED (MC, WL, AP ONLY): I-stat hCG, quantitative: >2000 m[IU]/mL — ABNORMAL HIGH (ref <5)

## 2017-04-05 LAB — LIPASE, BLOOD: Lipase: 33 U/L (ref 11–51)

## 2017-04-05 MED ORDER — DOXYLAMINE-PYRIDOXINE 10-10 MG PO TBEC: 1.0000 | DELAYED_RELEASE_TABLET | Freq: Every evening | ORAL | 0 refills | Status: DC | PRN

## 2017-04-05 MED ORDER — PRENATAL COMPLETE 14-0.4 MG PO TABS: 1.0000 | ORAL_TABLET | Freq: Every day | ORAL | 0 refills | Status: DC

## 2017-04-05 NOTE — ED Triage Notes (Signed)
Pt arrives via Pov from home with n/v over the last week. Lmp Jan/05/2017, requesting pregnancy testing. Denies recent fever. VSS. Pt awake, alert, appropriate.

## 2017-04-05 NOTE — Discharge Instructions (Signed)
Please read attached information. If you experience any new or worsening signs or symptoms please return to the emergency room for evaluation. Please follow-up with your primary care provider or specialist as discussed. Please use medication prescribed only as directed and discontinue taking if you have any concerning signs or symptoms.   °

## 2017-04-05 NOTE — ED Provider Notes (Signed)
[facility] EMERGENCY DEPARTMENT Provider Note   CSN: [id number] Arrival date & time: 04/05/17  1259   History   Chief Complaint Chief Complaint  Patient presents with  . Emesis   HPI Cassidy Underwood is a 22 y.o. female.  HPI    22 year old female presents today with complaints of emesis.  Patient notes her last 2 weeks she has had 2 episodes of vomiting.  Patient denies any fever, diarrhea, abdominal pain, vaginal bleeding or discharge.  Patient notes that she delivered a healthy baby boy approximately 5 months ago without complication.  Patient reports she received no prenatal care prior to the delivery.  Patient notes her last menstrual cycle was on January 4.   Past Medical History:  Diagnosis Date  . Abscess     Patient Active Problem List   Diagnosis Date Noted  . Pregnancy affected by fetal growth restriction 11/08/2016  . SVD (spontaneous vaginal delivery) 11/08/2016  . Homeless 11/02/2016  . Weakness 11/02/2016  . Nausea and vomiting during pregnancy 11/02/2016  . Preterm contractions 10/02/2016  . No prenatal care in current pregnancy in third trimester 10/02/2016    Past Surgical History:  Procedure Laterality Date  . DILATION AND CURETTAGE OF UTERUS    . DILATION AND EVACUATION N/A 11/09/2014   Procedure: DILATATION AND EVACUATION;  Surgeon: Levie Heritage, DO;  Location: [facility];  Service: Gynecology;  Laterality: N/A;    OB History    Gravida Para Term Preterm AB Living   3       2     SAB TAB Ectopic Multiple Live Births   1   1           Home Medications    Prior to Admission medications   Medication Sig Start Date End Date Taking? Authorizing Provider  acetaminophen (TYLENOL) 325 MG tablet Take 2 tablets (650 mg total) by mouth every 4 (four) hours as needed (for pain scale < 4). 11/10/16   Winfrey, Harlen Labs, MD  Doxylamine-Pyridoxine 10-10 MG TBEC Take 1 tablet by mouth at bedtime as needed. 04/05/17   Maniah Nading, Tinnie Gens, PA-C    ibuprofen (ADVIL,MOTRIN) 600 MG tablet Take 1 tablet (600 mg total) by mouth every 6 (six) hours. 11/10/16   Lennox Solders, MD  norgestimate-ethinyl estradiol (SPRINTEC 28) 0.25-35 MG-MCG tablet Take 1 tablet by mouth daily. DO NOT START until October 21st. 12/03/16   Arabella Merles, CNM  Prenatal Vit-Fe Fumarate-FA (PRENATAL COMPLETE) 14-0.4 MG TABS Take 1 tablet by mouth daily. 04/05/17   Eyvonne Mechanic, PA-C  Prenatal Vit-Fe Fumarate-FA (PRENATAL MULTIVITAMIN) TABS tablet Take 1 tablet by mouth daily at 12 noon. 11/10/16   Lennox Solders, MD    Family History Family History  Adopted: Yes  Family history unknown: Yes    Social History Social History   Tobacco Use  . Smoking status: Never Smoker  . Smokeless tobacco: Never Used  Substance Use Topics  . Alcohol use: No    Comment: use in past  . Drug use: No     Allergies   Patient has no known allergies.   Review of Systems Review of Systems  All other systems reviewed and are negative.    Physical Exam Updated Vital Signs BP (!) 111/53 (BP Location: Right Arm)   Pulse 79   Temp 97.9 F (36.6 C) (Oral)   Resp 16   Ht 4\' 10"  (1.473 m)   Wt 44 kg (97 lb)   SpO2  100%   BMI 20.27 kg/m   Physical Exam  Constitutional: She is oriented to person, place, and time. She appears well-developed and well-nourished.  HENT:  Head: Normocephalic and atraumatic.  Eyes: Conjunctivae are normal. Pupils are equal, round, and reactive to light. Right eye exhibits no discharge. Left eye exhibits no discharge. No scleral icterus.  Neck: Normal range of motion. No JVD present. No tracheal deviation present.  Pulmonary/Chest: Effort normal. No stridor.  Abdominal:  Soft nontender non-gravid abdomen  Neurological: She is alert and oriented to person, place, and time. Coordination normal.  Psychiatric: She has a normal mood and affect. Her behavior is normal. Judgment and thought content normal.  Nursing note and vitals  reviewed.    ED Treatments / Results  Labs (all labs ordered are listed, but only abnormal results are displayed) Labs Reviewed  COMPREHENSIVE METABOLIC PANEL - Abnormal; Notable for the following components:      Result Value   Calcium 8.6 (*)    All other components within normal limits  CBC - Abnormal; Notable for the following components:   Hemoglobin 11.6 (*)    HCT 35.2 (*)    All other components within normal limits  URINALYSIS, ROUTINE W REFLEX MICROSCOPIC - Abnormal; Notable for the following components:   Leukocytes, UA TRACE (*)    Squamous Epithelial / LPF 0-5 (*)    All other components within normal limits  I-STAT BETA HCG BLOOD, ED ([facility], [facility], [facility] ONLY) - Abnormal; Notable for the following components:   I-stat hCG, quantitative >2,000.0 (*)    All other components within normal limits  LIPASE, BLOOD    EKG  EKG Interpretation None       Radiology No results found.  Procedures Procedures (including critical care time)  Medications Ordered in ED Medications - No data to display   Initial Impression / Assessment and Plan / ED Course  I have reviewed the triage vital signs and the nursing notes.  Pertinent labs & imaging results that were available during my care of the patient were reviewed by me and considered in my medical decision making (see chart for details).      Final Clinical Impressions(s) / ED Diagnoses   Final diagnoses:  Nausea and vomiting, intractability of vomiting not specified, unspecified vomiting type   Labs: I-STAT beta-hCG, urinalysis, lipase, CMP, CBC  Imaging:  Consults:  Therapeutics:  Discharge Meds:   Assessment/Plan  22 year old female presents today with likely pregnancy-induced nausea and vomiting.  She is only had 2 episodes of the last 2 weeks.  She is tolerating p.o., afebrile with no significant abnormalities on her lab urinalysis.  Patient [facility],  she will be prescribed prenatal vitamins, antinausea medication and strict return precautions.  She verbalized understanding and agreement to today's plan had no further questions or concerns at time of discharge.  ED Discharge Orders        Ordered    Doxylamine-Pyridoxine 10-10 MG TBEC  At bedtime PRN     04/05/17 1646    Prenatal Vit-Fe Fumarate-FA (PRENATAL COMPLETE) 14-0.4 MG TABS  Daily     04/05/17 1646       Rosalio LoudHedges, Cyrus Ramsburg, PA-C 04/05/17 Franki [id number]    Wentz, Elliott, MD 04/06/17 [phone]

## 2017-04-28 ENCOUNTER — Other Ambulatory Visit: Payer: Self-pay

## 2017-04-28 DIAGNOSIS — Z590 Homelessness: Secondary | ICD-10-CM | POA: Insufficient documentation

## 2017-04-28 DIAGNOSIS — O200 Threatened abortion: Secondary | ICD-10-CM | POA: Insufficient documentation

## 2017-04-28 DIAGNOSIS — Z3A08 8 weeks gestation of pregnancy: Secondary | ICD-10-CM | POA: Insufficient documentation

## 2017-04-28 DIAGNOSIS — Z79899 Other long term (current) drug therapy: Secondary | ICD-10-CM | POA: Insufficient documentation

## 2017-04-29 ENCOUNTER — Encounter (HOSPITAL_COMMUNITY): Payer: Self-pay | Admitting: Emergency Medicine

## 2017-04-29 ENCOUNTER — Emergency Department (HOSPITAL_COMMUNITY): Payer: Self-pay

## 2017-04-29 ENCOUNTER — Emergency Department (HOSPITAL_COMMUNITY)
Admission: EM | Admit: 2017-04-29 | Discharge: 2017-04-29 | Disposition: A | Discharge status: Home / Self Care | Payer: Self-pay | Attending: Emergency Medicine | Admitting: Emergency Medicine

## 2017-04-29 DIAGNOSIS — O200 Threatened abortion: Secondary | ICD-10-CM

## 2017-04-29 DIAGNOSIS — N939 Abnormal uterine and vaginal bleeding, unspecified: Secondary | ICD-10-CM

## 2017-04-29 DIAGNOSIS — Z3490 Encounter for supervision of normal pregnancy, unspecified, unspecified trimester: Secondary | ICD-10-CM

## 2017-04-29 LAB — CBC
HCT: 30.9 % — ABNORMAL LOW (ref 36.0–46.0)
HEMOGLOBIN: 10.7 g/dL — AB (ref 12.0–15.0)
MCH: 30 pg (ref 26.0–34.0)
MCHC: 34.6 g/dL (ref 30.0–36.0)
MCV: 86.6 fL (ref 78.0–100.0)
Platelets: 376 10*3/uL (ref 150–400)
RBC: 3.57 MIL/uL — AB (ref 3.87–5.11)
RDW: 14 % (ref 11.5–15.5)
WBC: 11.2 10*3/uL — AB (ref 4.0–10.5)

## 2017-04-29 LAB — BASIC METABOLIC PANEL
ANION GAP: 8 (ref 5–15)
BUN: 6 mg/dL (ref 6–20)
CALCIUM: 8.9 mg/dL (ref 8.9–10.3)
CHLORIDE: 105 mmol/L (ref 101–111)
CO2: 22 mmol/L (ref 22–32)
Creatinine, Ser: 0.57 mg/dL (ref 0.44–1.00)
GFR calc non Af Amer: >60 mL/min (ref >60)
GLUCOSE: 133 mg/dL — AB (ref 65–99)
POTASSIUM: 3.3 mmol/L — AB (ref 3.5–5.1)
Sodium: 135 mmol/L (ref 135–145)

## 2017-04-29 LAB — HCG, QUANTITATIVE, PREGNANCY: HCG, BETA CHAIN, QUANT, S: 131833 m[IU]/mL — AB (ref <5)

## 2017-04-29 LAB — RPR: RPR: NONREACTIVE

## 2017-04-29 LAB — WET PREP, GENITAL
CLUE CELLS WET PREP: NONE SEEN
SPERM: NONE SEEN
TRICH WET PREP: NONE SEEN
Yeast Wet Prep HPF POC: NONE SEEN

## 2017-04-29 LAB — HIV ANTIBODY (ROUTINE TESTING W REFLEX): HIV SCREEN 4TH GENERATION: NONREACTIVE

## 2017-04-29 MED ORDER — PRENATAL COMPLETE 14-0.4 MG PO TABS: 1.0000 | ORAL_TABLET | Freq: Every day | ORAL | 0 refills | Status: DC

## 2017-04-29 MED ORDER — ACETAMINOPHEN 500 MG PO TABS
500.0000 mg | ORAL_TABLET | Freq: Once | ORAL | Status: DC
  Filled 2017-04-29: qty 1

## 2017-04-29 NOTE — Discharge Instructions (Addendum)
DO NOT HAVE SEX, USE TAMPONS, OR DOUCHE. You can take acetaminophen for pain if needed. You need to go to the Maternity Admissions Unit (or MA[facility] to be rechecked in 3-4 days. You should be rechecked sooner if you get severe pain, bleeding heavier than a period or if you feel worse. Currently your baby is 8 weeks 7 days and there is a possibility of a twin. There is blood underneath the placenta and the gestational sac is irregular is shape and both of these things increase your risk of a miscarriage. Try to take the prenatal vitamins.

## 2017-04-29 NOTE — ED Notes (Signed)
Provided patient with Sprite, graham crackers, and peanut butter. No distress noted.

## 2017-04-29 NOTE — ED Provider Notes (Signed)
[facility] EMERGENCY DEPARTMENT Provider Note   CSN: [id number] Arrival date & time: 04/28/17  2336  Time seen 05:43 AM   History   Chief Complaint Chief Complaint  Patient presents with  . Abdominal Pain  . Vaginal Bleeding    HPI Cassidy Underwood is a 22 y.o. female.  HPI patient is G4P1 AB 2 (1 tubal pregnancy and 1 miscarriage) states she is approximately 6 or [redacted] weeks pregnant.  She has not gotten any prenatal care for this pregnancy.  She is not taking any prenatal vitamins.  She states she cannot afford even the over-the-counter prenatal vitamins.  She states about 3 PM she had heavy bleeding for about 30 minutes however she is continued to have bleeding.  She states she started having lower pelvic cramping at the same time.  She states she has been having the cramping for about a week but she thought it was hunger pains because she does not have a lot to eat.  She states she still having some bleeding but not as heavily.  PCP Patient, No Pcp Per   Past Medical History:  Diagnosis Date  . Abscess     Patient Active Problem List   Diagnosis Date Noted  . Pregnancy affected by fetal growth restriction 11/08/2016  . SVD (spontaneous vaginal delivery) 11/08/2016  . Homeless 11/02/2016  . Weakness 11/02/2016  . Nausea and vomiting during pregnancy 11/02/2016  . Preterm contractions 10/02/2016  . No prenatal care in current pregnancy in third trimester 10/02/2016    Past Surgical History:  Procedure Laterality Date  . DILATION AND CURETTAGE OF UTERUS    . DILATION AND EVACUATION N/A 11/09/2014   Procedure: DILATATION AND EVACUATION;  Surgeon: Levie Heritage, DO;  Location: [facility];  Service: Gynecology;  Laterality: N/A;    OB History    Gravida Para Term Preterm AB Living   4       2     SAB TAB Ectopic Multiple Live Births   1   1           Home Medications    Prior to Admission medications   Medication Sig Start Date End Date Taking?  Authorizing Provider  acetaminophen (TYLENOL) 325 MG tablet Take 2 tablets (650 mg total) by mouth every 4 (four) hours as needed (for pain scale < 4). 11/10/16   Winfrey, Harlen Labs, MD  Doxylamine-Pyridoxine 10-10 MG TBEC Take 1 tablet by mouth at bedtime as needed. 04/05/17   Hedges, Tinnie Gens, PA-C  ibuprofen (ADVIL,MOTRIN) 600 MG tablet Take 1 tablet (600 mg total) by mouth every 6 (six) hours. 11/10/16   Lennox Solders, MD  norgestimate-ethinyl estradiol (SPRINTEC 28) 0.25-35 MG-MCG tablet Take 1 tablet by mouth daily. DO NOT START until October 21st. 12/03/16   Arabella Merles, CNM  Prenatal Vit-Fe Fumarate-FA (PRENATAL COMPLETE) 14-0.4 MG TABS Take 1 tablet by mouth daily. 04/05/17   Hedges, Tinnie Gens, PA-C  Prenatal Vit-Fe Fumarate-FA (PRENATAL COMPLETE) 14-0.4 MG TABS Take 1 tablet by mouth daily. 04/29/17   Devoria Albe, MD  Prenatal Vit-Fe Fumarate-FA (PRENATAL MULTIVITAMIN) TABS tablet Take 1 tablet by mouth daily at 12 noon. 11/10/16   Lennox Solders, MD    Family History Family History  Adopted: Yes  Family history unknown: Yes    Social History Social History   Tobacco Use  . Smoking status: Never Smoker  . Smokeless tobacco: Never Used  Substance Use Topics  . Alcohol use: No  Comment: use in past  . 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   Review of Systems Review of Systems  All other systems reviewed and are negative.    Physical Exam Updated Vital Signs BP (!) 101/59   Pulse 76   Temp 98.4 F (36.9 C) (Oral)   Resp 18   Ht 4\' 9"  (1.448 m)   Wt 43.1 kg (95 lb)   LMP 03/20/2017   SpO2 99%   BMI 20.56 kg/m   Vital signs normal    Physical Exam  Constitutional: She is oriented to person, place, and time. She appears well-developed and well-nourished.  Non-toxic appearance. She does not appear ill. No distress.    HENT:  Head: Normocephalic and atraumatic.  Right Ear: External ear normal.  Left Ear: External ear normal.  Nose: Nose normal. No mucosal edema or rhinorrhea.  Mouth/Throat: Oropharynx is clear and moist and mucous membranes are normal. No dental abscesses or uvula swelling.  Eyes: Conjunctivae and EOM are normal. Pupils are equal, round, and reactive to light.  Neck: Normal range of motion and full passive range of motion without pain. Neck supple.  Cardiovascular: Normal rate, regular rhythm and normal heart sounds. Exam reveals no gallop and no friction rub.  No murmur heard. Pulmonary/Chest: Effort normal and breath sounds normal. No respiratory distress. She has no wheezes. She has no rhonchi. She has no rales. She exhibits no tenderness and no crepitus.  Abdominal: Soft. Normal appearance and bowel sounds are normal. She exhibits no distension. There is tenderness in the suprapubic area. There is no rebound and no guarding.  Genitourinary:  Genitourinary Comments: Normal external genitalia, patient starts sliding up the bed when I inserted the speculum despite explaining if she relaxes and keeps her legs relaxed it will not pinch or get painful. There was NO BLOOD in the vault, her cx is bluish c/w pregnancy. Nontender ovaries and not enlarged. Uterus seems painful to palpation, unable to assess size b/o her tightening her abdominal wall muscles.   Musculoskeletal: Normal range of motion. She exhibits no edema or tenderness.  Moves all extremities well.   Neurological: She is alert and oriented to person, place, and time. She has normal strength. No cranial nerve deficit.  Skin: Skin is warm, dry and intact. No rash noted. No erythema. No pallor.  Psychiatric: She has a normal mood and affect. Her speech is normal and behavior is normal. Her mood appears not anxious.  Nursing note and vitals reviewed.    ED Treatments / Results  Labs (all labs ordered are listed, but only abnormal  results are displayed)  Results for orders placed or performed during the hospital encounter of 04/29/17  Wet prep, genital  Result Value Ref Range   Yeast Wet Prep HPF POC NONE SEEN NONE SEEN   Trich, Wet Prep NONE SEEN NONE SEEN   Clue Cells Wet Prep HPF POC NONE SEEN NONE SEEN   WBC, Wet Prep HPF POC RARE (A) NONE SEEN   Sperm NONE SEEN   hCG, quantitative, pregnancy  Result Value Ref Range   hCG, Beta Chain, Quant, S 131,833 (H) <5 mIU/mL  CBC  Result Value Ref Range   WBC 11.2 (H) 4.0 - 10.5 K/uL   RBC 3.57 (L) 3.87 - 5.11 MIL/uL   Hemoglobin 10.7 (L) 12.0 - 15.0  g/dL   HCT 96.030.9 (L) 45.436.0 - 09.846.0 %   MCV 86.6 78.0 - 100.0 fL   MCH 30.0 26.0 - 34.0 pg   MCHC 34.6 30.0 - 36.0 g/dL   RDW 11.914.0 14.711.5 - 82.915.5 %   Platelets 376 150 - 400 K/uL  Basic metabolic panel  Result Value Ref Range   Sodium 135 135 - 145 mmol/L   Potassium 3.3 (L) 3.5 - 5.1 mmol/L   Chloride 105 101 - 111 mmol/L   CO2 22 22 - 32 mmol/L   Glucose, Bld 133 (H) 65 - 99 mg/dL   BUN 6 6 - 20 mg/dL   Creatinine, Ser 5.620.57 0.44 - 1.00 mg/dL   Calcium 8.9 8.9 - 13.010.3 mg/dL   GFR calc non Af Amer >60 >60 mL/min   GFR calc Af Amer >60 >60 mL/min   Anion gap 8 5 - 15   Laboratory interpretation all normal except mild hypokalemia, mild anemia, leukocytosis, very positive BhCG  EKG  EKG Interpretation None       Radiology [place] Ob Less Than 14 Weeks With Ob Transvaginal  Result Date: 04/29/2017 CLINICAL DATA:  Pregnant patient with vaginal bleeding. EXAM: OBSTETRIC <14 WK US AND TRANSVAGINAL OB US TECHNIQUE: Both transabdominal and transvaginal ultrasound examinations were performed for complete evaluation of the gestation as well as the maternal uterus, adnexal regions, and pelvic cul-de-sac. Transvaginal technique was performed to assess early pregnancy. COMPARISON:  None. FINDINGS: Intrauterine gestational sac: Single. The gestational sac is irregular in appearance with suggestion of a peripheral septation. Yolk  sac:  Visualized. Embryo:  Visualized. Cardiac Activity: Visualized. Heart Rate: 140 bpm CRL:  17.1 mm   8 w   7 d                  US EDC: 12/08/2017 Subchorionic hemorrhage:  Moderate Maternal uterus/adnexae: Normal right and left ovaries. No adnexal mass. No free fluid in the pelvis. IMPRESSION: Irregular-appearing gestational sac containing a live intrauterine gestation. There is a moderate subchorionic hemorrhage. Along the peripheral aspect of the gestational sac there is a thin band which is nonspecific and may be secondary to the irregular appearance of the gestational sac. The possibility of 2 gestational sacs in the setting of a twin gestation is less likely however not entirely excluded. Recommend attention on short-term follow-up pelvic ultrasound. Electronically Signed   By: Annia Beltrew  Davis M.D.   On: 04/29/2017 07:31    Procedures Procedures (including critical care time)  Medications Ordered in ED Medications  acetaminophen (TYLENOL) tablet 500 mg (500 mg Oral Refused 04/29/17 0801)     Initial Impression / Assessment and Plan / ED Course  I have reviewed the triage vital signs and the nursing notes.  Pertinent labs & imaging results that were available during my care of the patient were reviewed by me and considered in my medical decision making (see chart for details).    Pelvic ultrasound was done to assess her pregnancy.  Pelvic exam was done.  Recheck at 7:50 AM we discussed her ultrasound results.  We also discussed the need to [facility].  Patient is homeless.  She has requested that they contact her by email rather than her phone to follow-up on her STD test.  We discussed she should not put anything in her vagina such as having sex, tampons, or douching.  She can take acetaminophen for pain if needed.  [facility] in the next 3-4  days.  However if she gets worse she should go and be seen sooner.   Final Clinical  Impressions(s) / ED Diagnoses   Final diagnoses:  Vaginal bleeding  Pregnant  Threatened miscarriage    ED Discharge Orders        Ordered    Prenatal Vit-Fe Fumarate-FA (PRENATAL COMPLETE) 14-0.4 MG TABS  Daily     04/29/17 0757    OTC acetaminophen  Plan discharge  Devoria Albe, MD, Concha Pyo, MD 04/29/17 [phone]

## 2017-04-29 NOTE — ED Triage Notes (Signed)
Reports being [redacted] weeks pregnant.  Noticed having suprapubic cramping and large amount of vaginal bleeding.  Reports not seeing any blood now.

## 2017-04-29 NOTE — ED Notes (Signed)
Patient transported to Ultrasound 

## 2017-04-30 LAB — GC/CHLAMYDIA PROBE AMP (~~LOC~~) NOT AT ARMC
CHLAMYDIA, DNA PROBE: NEGATIVE
NEISSERIA GONORRHEA: NEGATIVE

## 2017-05-07 ENCOUNTER — Inpatient Hospital Stay (HOSPITAL_COMMUNITY)
Admission: AD | Admit: 2017-05-07 | Discharge: 2017-05-08 | Disposition: A | Discharge status: Home / Self Care | Payer: Medicaid Other | Source: Ambulatory Visit | Attending: Obstetrics and Gynecology | Admitting: Obstetrics and Gynecology

## 2017-05-07 ENCOUNTER — Encounter (HOSPITAL_COMMUNITY): Payer: Self-pay | Admitting: *Deleted

## 2017-05-07 DIAGNOSIS — O98311 Other infections with a predominantly sexual mode of transmission complicating pregnancy, first trimester: Secondary | ICD-10-CM | POA: Insufficient documentation

## 2017-05-07 DIAGNOSIS — A5901 Trichomonal vulvovaginitis: Secondary | ICD-10-CM | POA: Insufficient documentation

## 2017-05-07 DIAGNOSIS — Z590 Homelessness: Secondary | ICD-10-CM | POA: Insufficient documentation

## 2017-05-07 DIAGNOSIS — R1030 Lower abdominal pain, unspecified: Secondary | ICD-10-CM | POA: Diagnosis present

## 2017-05-07 DIAGNOSIS — Z3A09 9 weeks gestation of pregnancy: Secondary | ICD-10-CM | POA: Insufficient documentation

## 2017-05-07 DIAGNOSIS — A599 Trichomoniasis, unspecified: Secondary | ICD-10-CM

## 2017-05-07 DIAGNOSIS — O26899 Other specified pregnancy related conditions, unspecified trimester: Secondary | ICD-10-CM

## 2017-05-07 DIAGNOSIS — R109 Unspecified abdominal pain: Secondary | ICD-10-CM

## 2017-05-07 LAB — URINALYSIS, ROUTINE W REFLEX MICROSCOPIC
Bilirubin Urine: NEGATIVE
Glucose, UA: NEGATIVE mg/dL
HGB URINE DIPSTICK: NEGATIVE
Ketones, ur: 80 mg/dL — AB
NITRITE: NEGATIVE
PROTEIN: NEGATIVE mg/dL
SPECIFIC GRAVITY, URINE: 1.026 (ref 1.005–1.030)
pH: 5 (ref 5.0–8.0)

## 2017-05-07 LAB — WET PREP, GENITAL
Clue Cells Wet Prep HPF POC: NONE SEEN
SPERM: NONE SEEN
Yeast Wet Prep HPF POC: NONE SEEN

## 2017-05-07 LAB — RAPID URINE DRUG SCREEN, HOSP PERFORMED
AMPHETAMINES: NOT DETECTED
Barbiturates: NOT DETECTED
Benzodiazepines: NOT DETECTED
COCAINE: POSITIVE — AB
OPIATES: NOT DETECTED
TETRAHYDROCANNABINOL: POSITIVE — AB

## 2017-05-07 MED ORDER — METRONIDAZOLE 500 MG PO TABS
2000.0000 mg | ORAL_TABLET | Freq: Once | ORAL | Status: AC
  Administered 2017-05-08: 2000 mg via ORAL
  Filled 2017-05-07: qty 4

## 2017-05-07 NOTE — MAU Note (Signed)
PT SAYS LOWER ABD - STARTED 3 DAYS AGO- TODAY WORSE.   HAS D/C- ITCHING .   BM YESTERDAY.  LAST SEX- 3 WEEKS AGO.    HAD  BLEEDING - ON 3-17-   [facility]

## 2017-05-07 NOTE — MAU Provider Note (Signed)
History     CSN: [id number]  Arrival date and time: 05/07/17 2219  Chief Complaint  Patient presents with  . Abdominal Pain   HPI Cassidy Underwood is a 22 y.o. [id number] at 1665w2d who presents via EMS for lower abdominal cramping. [facility] about a week ago and was found to have an IUP at 8 weeks. She rates the pain today a 5/10 and states it has been ongoing for 3 days. She reports a discharge but is unable to describe it. She reports using cocaine today and throughout the last week. She is homeless and is aware of resources in the area.   OB History    Gravida  4   Para      Term      Preterm      AB  2   Living  1     SAB  1   TAB      Ectopic  1   Multiple      Live Births  1           Past Medical History:  Diagnosis Date  . Abscess     Past Surgical History:  Procedure Laterality Date  . DILATION AND CURETTAGE OF UTERUS    . DILATION AND EVACUATION N/A 11/09/2014   Procedure: DILATATION AND EVACUATION;  Surgeon: Levie HeritageJacob J Stinson, DO;  Location: [facility];  Service: Gynecology;  Laterality: N/A;    Family History  Adopted: Yes  Family history unknown: Yes    Social History   Tobacco Use  . Smoking status: Never Smoker  . Smokeless tobacco: Never Used  Substance Use Topics  . Alcohol use: No    Comment: use in past  . Drug use: No    Allergies: No Known Allergies  Medications Prior to Admission  Medication Sig Dispense Refill Last Dose  . acetaminophen (TYLENOL) 325 MG tablet Take 2 tablets (650 mg total) by mouth every 4 (four) hours as needed (for pain scale < 4). 30 tablet 0   . Doxylamine-Pyridoxine 10-10 MG TBEC Take 1 tablet by mouth at bedtime as needed. 60 tablet 0   . ibuprofen (ADVIL,MOTRIN) 600 MG tablet Take 1 tablet (600 mg total) by mouth every 6 (six) hours. 30 tablet 0   . norgestimate-ethinyl estradiol (SPRINTEC 28) 0.25-35 MG-MCG tablet Take 1 tablet by mouth daily. DO NOT START until  October 21st. 1 Package 3   . Prenatal Vit-Fe Fumarate-FA (PRENATAL COMPLETE) 14-0.4 MG TABS Take 1 tablet by mouth daily. 60 each 0   . Prenatal Vit-Fe Fumarate-FA (PRENATAL COMPLETE) 14-0.4 MG TABS Take 1 tablet by mouth daily. 60 each 0   . Prenatal Vit-Fe Fumarate-FA (PRENATAL MULTIVITAMIN) TABS tablet Take 1 tablet by mouth daily at 12 noon. 30 tablet 0     Review of Systems  Constitutional: Negative.  Negative for fatigue and fever.  HENT: Negative.   Respiratory: Negative.  Negative for shortness of breath.   Cardiovascular: Negative.  Negative for chest pain.  Gastrointestinal: Positive for abdominal pain. Negative for constipation, diarrhea, nausea and vomiting.  Genitourinary: Positive for vaginal discharge. Negative for dysuria and vaginal bleeding.  Neurological: Negative.  Negative for dizziness and headaches.   Physical Exam   Blood pressure (!) 103/48, pulse 79, temperature 97.6 F (36.4 C), temperature source Oral, resp. rate 20, height 4\' 9"  (1.448 m), weight 93 lb 12 oz (42.5 kg), last menstrual period 03/20/2017, unknown if currently  breastfeeding.  Physical Exam  Nursing note and vitals reviewed. Constitutional: She is oriented to person, place, and time. She appears well-developed and well-nourished. No distress.  HENT:  Head: Normocephalic.  Eyes: Pupils are equal, round, and reactive to light.  Cardiovascular: Normal rate, regular rhythm and normal heart sounds.  Respiratory: Effort normal and breath sounds normal. No respiratory distress.  GI: Soft. Bowel sounds are normal. She exhibits no distension. There is no tenderness. There is no rebound and no guarding.  Genitourinary: Vaginal discharge found.  Neurological: She is alert and oriented to person, place, and time.  Skin: Skin is warm and dry.  Psychiatric: She has a normal mood and affect. Her behavior is normal. Judgment and thought content normal.    MAU Course  Procedures Results for orders placed  or performed during the hospital encounter of 05/07/17 (from the past 24 hour(s))  Urinalysis, Routine w reflex microscopic     Status: Abnormal   Collection Time: 05/07/17 10:32 PM  Result Value Ref Range   Color, Urine YELLOW YELLOW   APPearance HAZY (A) CLEAR   Specific Gravity, Urine 1.026 1.005 - 1.030   pH 5.0 5.0 - 8.0   Glucose, UA NEGATIVE NEGATIVE mg/dL   Hgb urine dipstick NEGATIVE NEGATIVE   Bilirubin Urine NEGATIVE NEGATIVE   Ketones, ur 80 (A) NEGATIVE mg/dL   Protein, ur NEGATIVE NEGATIVE mg/dL   Nitrite NEGATIVE NEGATIVE   Leukocytes, UA LARGE (A) NEGATIVE   RBC / HPF 0-5 0 - 5 RBC/hpf   WBC, UA 6-30 0 - 5 WBC/hpf   Bacteria, UA RARE (A) NONE SEEN   Squamous Epithelial / LPF 0-5 (A) NONE SEEN   Mucus PRESENT   Urine rapid drug screen (hosp performed)     Status: Abnormal   Collection Time: 05/07/17 10:32 PM  Result Value Ref Range   Opiates NONE DETECTED NONE DETECTED   Cocaine POSITIVE (A) NONE DETECTED   Benzodiazepines NONE DETECTED NONE DETECTED   Amphetamines NONE DETECTED NONE DETECTED   Tetrahydrocannabinol POSITIVE (A) NONE DETECTED   Barbiturates NONE DETECTED NONE DETECTED  Wet prep, genital     Status: Abnormal   Collection Time: 05/07/17 11:26 PM  Result Value Ref Range   Yeast Wet Prep HPF POC NONE SEEN NONE SEEN   Trich, Wet Prep PRESENT (A) NONE SEEN   Clue Cells Wet Prep HPF POC NONE SEEN NONE SEEN   WBC, Wet Prep HPF POC MODERATE (A) NONE SEEN   Sperm NONE SEEN    MDM UA UDS Wet prep and gc/chlamydia Metronidazole 2000mg  PO  After results of wet prep, patient requested screening for all STDs again.  Long discussion with patient about drug use in pregnancy.   Assessment and Plan   1. Trichimoniasis   2. Abdominal pain affecting pregnancy   3. [redacted] weeks gestation of pregnancy    -Discharge home in stable condition -Safe sex precautions discussed -Patient advised to follow-up with OB/GYN of choice to start prenatal care -Patient may  return to MAU as needed or if her condition were to change or worsen  Rolm Bookbinder CNM 05/07/2017, 10:57 PM

## 2017-05-08 DIAGNOSIS — A5901 Trichomonal vulvovaginitis: Secondary | ICD-10-CM

## 2017-05-08 DIAGNOSIS — O98311 Other infections with a predominantly sexual mode of transmission complicating pregnancy, first trimester: Secondary | ICD-10-CM

## 2017-05-08 DIAGNOSIS — Z3A09 9 weeks gestation of pregnancy: Secondary | ICD-10-CM

## 2017-05-08 LAB — RPR: RPR Ser Ql: NONREACTIVE

## 2017-05-08 LAB — GC/CHLAMYDIA PROBE AMP (~~LOC~~) NOT AT ARMC
Chlamydia: NEGATIVE
Neisseria Gonorrhea: NEGATIVE

## 2017-05-08 NOTE — Discharge Instructions (Signed)

## 2017-05-09 LAB — HIV ANTIBODY (ROUTINE TESTING W REFLEX): HIV SCREEN 4TH GENERATION: NONREACTIVE

## 2017-05-09 LAB — CULTURE, OB URINE: CULTURE: NO GROWTH

## 2017-05-24 ENCOUNTER — Other Ambulatory Visit (HOSPITAL_COMMUNITY): Payer: Self-pay | Admitting: Nurse Practitioner

## 2017-05-24 DIAGNOSIS — Z3A13 13 weeks gestation of pregnancy: Secondary | ICD-10-CM

## 2017-05-24 DIAGNOSIS — Z369 Encounter for antenatal screening, unspecified: Secondary | ICD-10-CM

## 2017-05-24 DIAGNOSIS — Z3682 Encounter for antenatal screening for nuchal translucency: Secondary | ICD-10-CM

## 2017-05-24 LAB — OB RESULTS CONSOLE HGB/HCT, BLOOD
HCT: 33
HEMOGLOBIN: 11.2

## 2017-05-24 LAB — CULTURE, OB URINE: URINE CULTURE, OB: NEGATIVE

## 2017-05-24 LAB — OB RESULTS CONSOLE ABO/RH: RH Type: POSITIVE

## 2017-05-24 LAB — OB RESULTS CONSOLE RPR: RPR: NONREACTIVE

## 2017-05-24 LAB — OB RESULTS CONSOLE GC/CHLAMYDIA
Chlamydia: NEGATIVE
Gonorrhea: NEGATIVE

## 2017-05-24 LAB — OB RESULTS CONSOLE ANTIBODY SCREEN: ANTIBODY SCREEN: NEGATIVE

## 2017-05-24 LAB — OB RESULTS CONSOLE PLATELET COUNT: PLATELETS: 416

## 2017-05-25 LAB — OB RESULTS CONSOLE HEPATITIS B SURFACE ANTIGEN: HEP B S AG: NEGATIVE

## 2017-05-25 LAB — OB RESULTS CONSOLE RUBELLA ANTIBODY, IGM: RUBELLA: IMMUNE

## 2017-05-29 ENCOUNTER — Encounter: Payer: Self-pay | Admitting: *Deleted

## 2017-06-04 ENCOUNTER — Encounter (HOSPITAL_COMMUNITY): Payer: Self-pay

## 2017-06-05 ENCOUNTER — Ambulatory Visit (HOSPITAL_COMMUNITY)
Admission: RE | Admit: 2017-06-05 | Discharge: 2017-06-05 | Disposition: A | Discharge status: Home / Self Care | Payer: Self-pay | Source: Ambulatory Visit | Attending: Nurse Practitioner | Admitting: Nurse Practitioner

## 2017-06-05 ENCOUNTER — Encounter (HOSPITAL_COMMUNITY): Payer: Self-pay

## 2017-06-05 ENCOUNTER — Other Ambulatory Visit (HOSPITAL_COMMUNITY): Payer: Self-pay

## 2017-06-05 HISTORY — DX: Other psychoactive substance abuse, uncomplicated: F19.10

## 2017-06-05 HISTORY — DX: Cocaine abuse, uncomplicated: F14.10

## 2017-06-13 ENCOUNTER — Encounter: Payer: Self-pay | Admitting: Obstetrics and Gynecology

## 2017-06-15 NOTE — Progress Notes (Signed)
Patient did not keep new OB appointment for 06/13/2017.  Cornelia Copa MD Attending Center for [employer] [facility])

## 2017-07-29 ENCOUNTER — Encounter (HOSPITAL_COMMUNITY): Payer: Self-pay | Admitting: *Deleted

## 2017-07-29 ENCOUNTER — Inpatient Hospital Stay (HOSPITAL_COMMUNITY)
Admission: AD | Admit: 2017-07-29 | Discharge: 2017-07-29 | Disposition: A | Discharge status: Home / Self Care | Payer: Medicaid Other | Source: Ambulatory Visit | Attending: Obstetrics and Gynecology | Admitting: Obstetrics and Gynecology

## 2017-07-29 DIAGNOSIS — O26899 Other specified pregnancy related conditions, unspecified trimester: Secondary | ICD-10-CM

## 2017-07-29 DIAGNOSIS — R1010 Upper abdominal pain, unspecified: Secondary | ICD-10-CM | POA: Diagnosis not present

## 2017-07-29 DIAGNOSIS — O0932 Supervision of pregnancy with insufficient antenatal care, second trimester: Secondary | ICD-10-CM | POA: Diagnosis not present

## 2017-07-29 DIAGNOSIS — Z5900 Homelessness unspecified: Secondary | ICD-10-CM

## 2017-07-29 DIAGNOSIS — O99320 Drug use complicating pregnancy, unspecified trimester: Secondary | ICD-10-CM

## 2017-07-29 DIAGNOSIS — Z3A21 21 weeks gestation of pregnancy: Secondary | ICD-10-CM | POA: Insufficient documentation

## 2017-07-29 DIAGNOSIS — O99322 Drug use complicating pregnancy, second trimester: Secondary | ICD-10-CM | POA: Insufficient documentation

## 2017-07-29 DIAGNOSIS — F1910 Other psychoactive substance abuse, uncomplicated: Secondary | ICD-10-CM | POA: Diagnosis present

## 2017-07-29 DIAGNOSIS — F1410 Cocaine abuse, uncomplicated: Secondary | ICD-10-CM | POA: Diagnosis not present

## 2017-07-29 DIAGNOSIS — O26892 Other specified pregnancy related conditions, second trimester: Secondary | ICD-10-CM | POA: Insufficient documentation

## 2017-07-29 DIAGNOSIS — R109 Unspecified abdominal pain: Secondary | ICD-10-CM | POA: Diagnosis present

## 2017-07-29 DIAGNOSIS — O0930 Supervision of pregnancy with insufficient antenatal care, unspecified trimester: Secondary | ICD-10-CM

## 2017-07-29 LAB — URINALYSIS, ROUTINE W REFLEX MICROSCOPIC
Bilirubin Urine: NEGATIVE
Glucose, UA: 50 mg/dL — AB
Hgb urine dipstick: NEGATIVE
Ketones, ur: NEGATIVE mg/dL
Leukocytes, UA: NEGATIVE
Nitrite: NEGATIVE
Protein, ur: NEGATIVE mg/dL
Specific Gravity, Urine: 1.015 (ref 1.005–1.030)
pH: 6 (ref 5.0–8.0)

## 2017-07-29 LAB — RAPID URINE DRUG SCREEN, HOSP PERFORMED
Amphetamines: NOT DETECTED
Benzodiazepines: NOT DETECTED
Cocaine: POSITIVE — AB
Opiates: NOT DETECTED
Tetrahydrocannabinol: NOT DETECTED

## 2017-07-29 LAB — WET PREP, GENITAL
Clue Cells Wet Prep HPF POC: NONE SEEN
Sperm: NONE SEEN
Trich, Wet Prep: NONE SEEN
Yeast Wet Prep HPF POC: NONE SEEN

## 2017-07-29 NOTE — MAU Note (Signed)
Patient discharged. Discharge instructions reviewed; patient verbalized understanding of discharge plan of care/follow up. Alert and oriented to baseline. All belongings sent home with patient upon discharge.

## 2017-07-29 NOTE — MAU Provider Note (Signed)
History     CSN: [id number]  Arrival date and time: 07/29/17 1115   First Provider Initiated Contact with Patient 07/29/17 1220      Chief Complaint  Patient presents with  . Abdominal Pain   HPI  Ms.  Cassidy Underwood is a 22 y.o. year old G50P0021 female at [redacted]w[redacted]d weeks gestation who presents to MAU reporting upper mid abdominal pain that radiates down to her lower abdomen x 1.5 wks ago. She describes the pain as sharp and intermittent; rated 6/10. She is homeless and sleeps on the concrete. She reports that she called EMS to be evaluated, but "didn't go with them". She is well dated by an [redacted]w[redacted]d gestation ultrasound on 04/29/17. She has no prenatal care at this time. She admits to illegal substance abuse; last used cocaine "last weekend into beginning of last week". Last baby born in 10/2016 was adopted and "the family wants to take this baby, too."  Past Medical History:  Diagnosis Date  . Abscess   . Cocaine abuse (HCC)   . Substance abuse [facility])     Past Surgical History:  Procedure Laterality Date  . DILATION AND CURETTAGE OF UTERUS    . DILATION AND EVACUATION N/A 11/09/2014   Procedure: DILATATION AND EVACUATION;  Surgeon: Levie Heritage, DO;  Location: [facility];  Service: Gynecology;  Laterality: N/A;    Family History  Adopted: Yes  Family history unknown: Yes    Social History   Tobacco Use  . Smoking status: Never Smoker  . Smokeless tobacco: Never Used  Substance Use Topics  . Alcohol use: No    Comment: use in past  . Drug use: Yes    Comment: + Cocaine, THC on 05/07/17    Allergies: No Known Allergies  Medications Prior to Admission  Medication Sig Dispense Refill Last Dose  . acetaminophen (TYLENOL) 325 MG tablet Take 2 tablets (650 mg total) by mouth every 4 (four) hours as needed (for pain scale < 4). 30 tablet 0   . Doxylamine-Pyridoxine 10-10 MG TBEC Take 1 tablet by mouth at bedtime as needed. 60 tablet 0   . Prenatal Vit-Fe Fumarate-FA (PRENATAL COMPLETE)  14-0.4 MG TABS Take 1 tablet by mouth daily. 60 each 0     Review of Systems  Constitutional: Negative.   HENT: Negative.   Eyes: Negative.   Respiratory: Negative.   Cardiovascular: Negative.   Gastrointestinal: Positive for abdominal pain (upper abd pain that radiates down to lower abd x 1.5 wks).  Endocrine: Negative.   Genitourinary: Negative.   Allergic/Immunologic: Negative.   Neurological: Negative.   Hematological: Negative.    Physical Exam   Blood pressure (!) 115/58, pulse 87, temperature 97.8 F (36.6 C), temperature source Oral, resp. rate 17, last menstrual period 03/20/2017, SpO2 100 %.  Physical Exam  MAU Course  Procedures  MDM [facility] UDS Wet Prep GC/CT -- pending FHTs by doppler: 137 bpm  Informal BS U/S: active fetus, (+) cardiac activity, size appears to be = dates, normal appearing amount of fluid -- pt reassured of fetal well-being on U/S screen Regular diet -- abdominal pain resolved  Results for orders placed or performed during the hospital encounter of 07/29/17 (from the past 24 hour(s))  Wet prep, genital     Status: Abnormal   Collection Time: 07/29/17 11:15 AM  Result Value Ref Range   Yeast Wet Prep HPF POC NONE SEEN NONE SEEN   Trich, Wet Prep NONE SEEN NONE SEEN   Clue  Cells Wet Prep HPF POC NONE SEEN NONE SEEN   WBC, Wet Prep HPF POC FEW (A) NONE SEEN   Sperm NONE SEEN   Urinalysis, Routine w reflex microscopic     Status: Abnormal   Collection Time: 07/29/17 11:27 AM  Result Value Ref Range   Color, Urine YELLOW YELLOW   APPearance HAZY (A) CLEAR   Specific Gravity, Urine 1.015 1.005 - 1.030   pH 6.0 5.0 - 8.0   Glucose, UA 50 (A) NEGATIVE mg/dL   Hgb urine dipstick NEGATIVE NEGATIVE   Bilirubin Urine NEGATIVE NEGATIVE   Ketones, ur NEGATIVE NEGATIVE mg/dL   Protein, ur NEGATIVE NEGATIVE mg/dL   Nitrite NEGATIVE NEGATIVE   Leukocytes, UA NEGATIVE NEGATIVE  Rapid urine drug screen (hospital performed)     Status: Abnormal    Collection Time: 07/29/17 11:27 AM  Result Value Ref Range   Opiates NONE DETECTED NONE DETECTED   Cocaine POSITIVE (A) NONE DETECTED   Benzodiazepines NONE DETECTED NONE DETECTED   Amphetamines NONE DETECTED NONE DETECTED   Tetrahydrocannabinol NONE DETECTED NONE DETECTED   Barbiturates (A) NONE DETECTED    Result not available. Reagent lot number recalled by manufacturer.    Assessment and Plan  Substance abuse affecting pregnancy, antepartum  - Notified of (+) cocaine on UDS - Patient states she is "trying to get in a rehab program for pregnant women, but her boyfriend doesn't want her to go because he can't go"    No prenatal care in current pregnancy in second trimester  - [facility] info given - Msg sent to admin pool to call patient to schedule NOB  - Discharge patient - Call to schedule NOB appt with [facility] - Patient verbalized an understanding of the plan of care and agrees.   Cassidy Moraolitta Shafer Swamy, MSN, CNM 07/29/2017, 12:42 PM

## 2017-07-29 NOTE — MAU Note (Addendum)
Cassidy Underwood is a 22 y.o. at 5758w1d here in MAU reporting: upper mid abdominal pain that radiates to lower abdomen. Sharp; intermittent  Onset of complaint: started about 1.5 week ago;  stated she had an ambulance come out then to evaluate but didn't go with them because she thought she could bare it but now she states she cant handle it anymore Patient reports that she is homeless and has been sleeping on concrete. Endorses not having started care for this pregnancy except for a  GHD visits; [facility]. Is inquiring about how far a long she is. Pain score: 6/10 Vitals:   07/29/17 1129  BP: (!) 115/58  Pulse: 87  Resp: 17  Temp: 97.8 F (36.6 C)  SpO2: 100%     FHT: 137 Lab orders placed from triage: ua

## 2017-07-30 LAB — GC/CHLAMYDIA PROBE AMP (~~LOC~~) NOT AT ARMC
Chlamydia: NEGATIVE
Neisseria Gonorrhea: NEGATIVE

## 2017-08-02 ENCOUNTER — Encounter: Payer: Self-pay | Admitting: Advanced Practice Midwife

## 2017-08-02 ENCOUNTER — Encounter: Payer: Self-pay | Admitting: General Practice

## 2017-08-02 ENCOUNTER — Ambulatory Visit (INDEPENDENT_AMBULATORY_CARE_PROVIDER_SITE_OTHER): Payer: Medicaid Other | Admitting: Advanced Practice Midwife

## 2017-08-02 DIAGNOSIS — Z3480 Encounter for supervision of other normal pregnancy, unspecified trimester: Secondary | ICD-10-CM

## 2017-08-02 DIAGNOSIS — O99322 Drug use complicating pregnancy, second trimester: Secondary | ICD-10-CM

## 2017-08-02 DIAGNOSIS — O0990 Supervision of high risk pregnancy, unspecified, unspecified trimester: Secondary | ICD-10-CM | POA: Insufficient documentation

## 2017-08-02 DIAGNOSIS — O99320 Drug use complicating pregnancy, unspecified trimester: Secondary | ICD-10-CM

## 2017-08-02 DIAGNOSIS — Z3482 Encounter for supervision of other normal pregnancy, second trimester: Secondary | ICD-10-CM

## 2017-08-02 NOTE — Progress Notes (Signed)
Subjective:   Cassidy Underwood is a 22 y.o. [phone] at [redacted]w[redacted]d by early ultrasound being seen today for her first obstetrical visit.  Her obstetrical history is significant for substance abuse, homelessness. Patient does not intend to breast feed. Pregnancy history fully reviewed. Planning for baby up for adoption with the same family that adopted her last baby.   Patient reports no complaints.  HISTORY: OB History  Gravida Para Term Preterm AB Living  4 1 0 1 2 1   SAB TAB Ectopic Multiple Live Births  1 0 0 0 1    # Outcome Date GA Lbr Len/2nd Weight Sex Delivery Anes PTL Lv  4 Current           3 Preterm 11/08/16 [redacted]w[redacted]d    Vag-Spont     2 AB 11/09/14 [redacted]w[redacted]d    SAB     1 SAB 2015 [redacted]w[redacted]d    SAB       Last pap smear was done never and was has not had one done yet due to age  Past Medical History:  Diagnosis Date  . Abscess   . Cocaine abuse (HCC)   . Substance abuse [facility])    Past Surgical History:  Procedure Laterality Date  . DILATION AND CURETTAGE OF UTERUS    . DILATION AND EVACUATION N/A 11/09/2014   Procedure: DILATATION AND EVACUATION;  Surgeon: Levie Heritage, DO;  Location: [facility];  Service: Gynecology;  Laterality: N/A;   Family History  Adopted: Yes  Family history unknown: Yes   Social History   Tobacco Use  . Smoking status: Never Smoker  . Smokeless tobacco: Never Used  Substance Use Topics  . Alcohol use: No    Comment: use in past  . Drug use: Yes    Comment: + Cocaine, THC on 05/07/17   No Known Allergies Current Outpatient Medications on File Prior to Visit  Medication Sig Dispense Refill  . Prenatal Vit-Fe Fumarate-FA (PRENATAL COMPLETE) 14-0.4 MG TABS Take 1 tablet by mouth daily. 60 each 0   No current facility-administered medications on file prior to visit.     Review of Systems Pertinent items noted in HPI and remainder of comprehensive ROS otherwise negative.  Exam   Vitals:   08/02/17 1604  BP: 103/66  Pulse: 81  Weight: 101 lb 12.8 oz  (46.2 kg)      Uterus:   21 cm  Pelvic Exam: Perineum:    Vulva:    Vagina:     Cervix:    Adnexa:    Bony Pelvis:   System: General: well-developed, well-nourished female in no acute distress   Breast:  normal appearance, no masses or tenderness   Skin: normal coloration and turgor, no rashes   Neurologic: oriented, normal, negative, normal mood   Extremities: normal strength, tone, and muscle mass, ROM of all joints is normal   HEENT PERRLA, extraocular movement intact and sclera clear, anicteric   Mouth/Teeth mucous membranes moist, pharynx normal without lesions and dental hygiene good   Neck supple and no masses   Cardiovascular: regular rate and rhythm   Respiratory:  no respiratory distress, normal breath sounds   Abdomen: soft, non-tender; bowel sounds normal; no masses,  no organomegaly    Assessment:   Pregnancy: [id number] Patient Active Problem List   Diagnosis Date Noted  . Supervision of other normal pregnancy, antepartum 08/02/2017  . Substance abuse affecting pregnancy, antepartum 07/29/2017  . No prenatal care in current pregnancy 07/29/2017  . Homeless 11/02/2016  Plan:  1. Supervision of other normal pregnancy, antepartum - US MFM OB COMP + 14 WK; Future  2. Substance abuse affecting pregnancy, antepartum   Patient declines pap/pelvic exam today. States that she is willing to have this done when she is prepared. Will be prepared for next visit.    [facility], and reviewed  Continue prenatal vitamins. Genetic Screening discussed, First trimester screen and Quad screen: too late. Ultrasound discussed; fetal anatomic survey: requested. Problem list reviewed and updated. The nature of [facility] with multiple MDs and other Advanced Practice Providers was explained to patient; also emphasized that residents, students are part of our team. Routine obstetric precautions reviewed. 50% of 45 min  visit spent in counseling and coordination of care. No follow-ups on file.

## 2017-08-08 ENCOUNTER — Ambulatory Visit (HOSPITAL_COMMUNITY): Payer: Medicaid Other | Attending: Advanced Practice Midwife

## 2017-08-14 ENCOUNTER — Encounter (HOSPITAL_COMMUNITY): Payer: Self-pay

## 2017-08-22 ENCOUNTER — Ambulatory Visit (HOSPITAL_COMMUNITY): Payer: Medicaid Other | Attending: Advanced Practice Midwife

## 2017-08-30 ENCOUNTER — Encounter: Payer: Self-pay | Admitting: General Practice

## 2017-08-30 ENCOUNTER — Telehealth: Payer: Self-pay | Admitting: General Practice

## 2017-08-30 ENCOUNTER — Encounter: Payer: Self-pay | Admitting: Obstetrics and Gynecology

## 2017-08-30 NOTE — Telephone Encounter (Signed)
Patient missed OB appointment this morning.  Called patient to get rescheduled, but no answer.  Left message on VM for patient to give our office a call to reschedule.

## 2017-09-21 ENCOUNTER — Inpatient Hospital Stay (HOSPITAL_COMMUNITY)
Admission: AD | Admit: 2017-09-21 | Discharge: 2017-09-21 | Disposition: A | Discharge status: Home / Self Care | Payer: Medicaid Other | Source: Ambulatory Visit | Attending: Obstetrics and Gynecology | Admitting: Obstetrics and Gynecology

## 2017-09-21 ENCOUNTER — Encounter (HOSPITAL_COMMUNITY): Payer: Self-pay

## 2017-09-21 ENCOUNTER — Inpatient Hospital Stay (HOSPITAL_BASED_OUTPATIENT_CLINIC_OR_DEPARTMENT_OTHER): Payer: Medicaid Other

## 2017-09-21 DIAGNOSIS — Z79899 Other long term (current) drug therapy: Secondary | ICD-10-CM | POA: Insufficient documentation

## 2017-09-21 DIAGNOSIS — O26893 Other specified pregnancy related conditions, third trimester: Secondary | ICD-10-CM

## 2017-09-21 DIAGNOSIS — Z3A30 30 weeks gestation of pregnancy: Secondary | ICD-10-CM

## 2017-09-21 DIAGNOSIS — O0933 Supervision of pregnancy with insufficient antenatal care, third trimester: Secondary | ICD-10-CM | POA: Diagnosis not present

## 2017-09-21 DIAGNOSIS — Z3A29 29 weeks gestation of pregnancy: Secondary | ICD-10-CM

## 2017-09-21 DIAGNOSIS — O99320 Drug use complicating pregnancy, unspecified trimester: Secondary | ICD-10-CM

## 2017-09-21 DIAGNOSIS — F1410 Cocaine abuse, uncomplicated: Secondary | ICD-10-CM

## 2017-09-21 DIAGNOSIS — R102 Pelvic and perineal pain: Secondary | ICD-10-CM

## 2017-09-21 DIAGNOSIS — O99323 Drug use complicating pregnancy, third trimester: Secondary | ICD-10-CM | POA: Diagnosis not present

## 2017-09-21 DIAGNOSIS — Z3480 Encounter for supervision of other normal pregnancy, unspecified trimester: Secondary | ICD-10-CM

## 2017-09-21 DIAGNOSIS — Z363 Encounter for antenatal screening for malformations: Secondary | ICD-10-CM

## 2017-09-21 DIAGNOSIS — O4703 False labor before 37 completed weeks of gestation, third trimester: Secondary | ICD-10-CM | POA: Insufficient documentation

## 2017-09-21 DIAGNOSIS — Z590 Homelessness: Secondary | ICD-10-CM | POA: Insufficient documentation

## 2017-09-21 DIAGNOSIS — O26899 Other specified pregnancy related conditions, unspecified trimester: Secondary | ICD-10-CM

## 2017-09-21 LAB — URINALYSIS, ROUTINE W REFLEX MICROSCOPIC
Bilirubin Urine: NEGATIVE
Glucose, UA: NEGATIVE mg/dL
Hgb urine dipstick: NEGATIVE
KETONES UR: NEGATIVE mg/dL
Nitrite: NEGATIVE
PH: 7 (ref 5.0–8.0)
Protein, ur: 30 mg/dL — AB
Specific Gravity, Urine: 1.02 (ref 1.005–1.030)

## 2017-09-21 LAB — FETAL FIBRONECTIN: FETAL FIBRONECTIN: NEGATIVE

## 2017-09-21 NOTE — MAU Note (Signed)
OK not to place pt back on EFM after u/s per Margarita MailW. Karim CNM

## 2017-09-21 NOTE — Progress Notes (Addendum)
Margarita MailW. Karim CNM in to discuss written and verbal d/c instructions given and understanding voiced. [facility]
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
## 2017-09-21 NOTE — Discharge Instructions (Signed)
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
## 2017-09-24 ENCOUNTER — Inpatient Hospital Stay (HOSPITAL_COMMUNITY)
Admission: AD | Admit: 2017-09-24 | Discharge: 2017-09-24 | Discharge status: Against Medical Advice | Payer: Medicaid Other | Source: Ambulatory Visit | Attending: Obstetrics and Gynecology | Admitting: Obstetrics and Gynecology

## 2017-09-24 NOTE — MAU Note (Signed)
Pt called, not in lobby 

## 2017-09-24 NOTE — MAU Note (Signed)
2nd call - pt not in lobby. 

## 2017-09-24 NOTE — MAU Note (Signed)
Pt left, did not inform staff

## 2017-10-04 ENCOUNTER — Ambulatory Visit (HOSPITAL_COMMUNITY)
Admission: RE | Admit: 2017-10-04 | Discharge: 2017-10-04 | Disposition: A | Discharge status: Home / Self Care | Payer: Medicaid Other | Source: Ambulatory Visit | Attending: Advanced Practice Midwife | Admitting: Advanced Practice Midwife

## 2017-10-04 ENCOUNTER — Other Ambulatory Visit: Payer: Self-pay | Admitting: Advanced Practice Midwife

## 2017-10-04 DIAGNOSIS — O99323 Drug use complicating pregnancy, third trimester: Secondary | ICD-10-CM | POA: Diagnosis not present

## 2017-10-04 DIAGNOSIS — Z8751 Personal history of pre-term labor: Secondary | ICD-10-CM

## 2017-10-04 DIAGNOSIS — Z3A30 30 weeks gestation of pregnancy: Secondary | ICD-10-CM | POA: Diagnosis not present

## 2017-10-04 DIAGNOSIS — O99333 Smoking (tobacco) complicating pregnancy, third trimester: Secondary | ICD-10-CM | POA: Diagnosis not present

## 2017-10-04 DIAGNOSIS — Z3689 Encounter for other specified antenatal screening: Secondary | ICD-10-CM | POA: Diagnosis not present

## 2017-10-04 DIAGNOSIS — F1910 Other psychoactive substance abuse, uncomplicated: Secondary | ICD-10-CM | POA: Diagnosis not present

## 2017-10-04 DIAGNOSIS — Z3480 Encounter for supervision of other normal pregnancy, unspecified trimester: Secondary | ICD-10-CM

## 2017-10-04 DIAGNOSIS — O0933 Supervision of pregnancy with insufficient antenatal care, third trimester: Secondary | ICD-10-CM | POA: Insufficient documentation

## 2017-10-04 DIAGNOSIS — Z363 Encounter for antenatal screening for malformations: Secondary | ICD-10-CM

## 2017-10-05 ENCOUNTER — Encounter: Payer: Self-pay | Admitting: Family

## 2017-10-05 ENCOUNTER — Other Ambulatory Visit (HOSPITAL_COMMUNITY): Payer: Self-pay | Admitting: *Deleted

## 2017-10-05 ENCOUNTER — Ambulatory Visit (INDEPENDENT_AMBULATORY_CARE_PROVIDER_SITE_OTHER): Payer: Medicaid Other | Admitting: Family

## 2017-10-05 ENCOUNTER — Other Ambulatory Visit: Payer: Self-pay

## 2017-10-05 ENCOUNTER — Encounter: Payer: Self-pay | Admitting: Advanced Practice Midwife

## 2017-10-05 ENCOUNTER — Other Ambulatory Visit (HOSPITAL_COMMUNITY)
Admission: RE | Admit: 2017-10-05 | Discharge: 2017-10-05 | Disposition: A | Discharge status: Home / Self Care | Payer: Medicaid Other | Source: Ambulatory Visit | Attending: Family | Admitting: Family

## 2017-10-05 VITALS — BP 100/59 | HR 97 | Wt 107.4 lb

## 2017-10-05 DIAGNOSIS — Z23 Encounter for immunization: Secondary | ICD-10-CM

## 2017-10-05 DIAGNOSIS — N898 Other specified noninflammatory disorders of vagina: Secondary | ICD-10-CM

## 2017-10-05 DIAGNOSIS — Z3483 Encounter for supervision of other normal pregnancy, third trimester: Secondary | ICD-10-CM

## 2017-10-05 DIAGNOSIS — Z3480 Encounter for supervision of other normal pregnancy, unspecified trimester: Secondary | ICD-10-CM

## 2017-10-05 DIAGNOSIS — O26893 Other specified pregnancy related conditions, third trimester: Secondary | ICD-10-CM

## 2017-10-05 DIAGNOSIS — Z3A31 31 weeks gestation of pregnancy: Secondary | ICD-10-CM | POA: Insufficient documentation

## 2017-10-05 DIAGNOSIS — O99323 Drug use complicating pregnancy, third trimester: Secondary | ICD-10-CM

## 2017-10-05 DIAGNOSIS — Z3493 Encounter for supervision of normal pregnancy, unspecified, third trimester: Secondary | ICD-10-CM

## 2017-10-05 DIAGNOSIS — B3731 Acute candidiasis of vulva and vagina: Secondary | ICD-10-CM

## 2017-10-05 DIAGNOSIS — O365990 Maternal care for other known or suspected poor fetal growth, unspecified trimester, not applicable or unspecified: Secondary | ICD-10-CM | POA: Insufficient documentation

## 2017-10-05 DIAGNOSIS — B373 Candidiasis of vulva and vagina: Secondary | ICD-10-CM

## 2017-10-05 DIAGNOSIS — O99320 Drug use complicating pregnancy, unspecified trimester: Secondary | ICD-10-CM

## 2017-10-05 DIAGNOSIS — Z3490 Encounter for supervision of normal pregnancy, unspecified, unspecified trimester: Secondary | ICD-10-CM | POA: Insufficient documentation

## 2017-10-05 NOTE — Progress Notes (Signed)
   PRENATAL VISIT NOTE  Subjective:  Cassidy Underwood is a 22 y.o. [id number] at 31.[redacted] weeks gestation being seen today for ongoing prenatal care.  Here with social support person Ms. Arline AspCindy, affiliation not identified. Pt reports [facility] and is still living with [name].  Attending substance abuse classes.  Last cocaine use was Sunday.  Used $20 compared to normal $100, which is an increase from normal.  She is currently monitored for the following issues for this high-risk pregnancy and has Homeless; Substance abuse affecting pregnancy, antepartum; Supervision of other normal pregnancy, antepartum; Pregnancy with adoption planned; and IUGR (intrauterine growth restriction) affecting care of mother on their problem list.  Patient reports no bleeding and vaginal irritation.  Contractions: Irregular. Vag. Bleeding: None.  Movement: Present. Denies leaking of fluid.   The following portions of the patient's history were reviewed and updated as appropriate: allergies, current medications, past family history, past medical history, past social history, past surgical history and problem list. Problem list updated.  Objective:   Vitals:   10/05/17 0924  BP: (!) 100/59  Pulse: 97  Weight: 107 lb 6.4 oz (48.7 kg)    Fetal Status: Fetal Heart Rate (bpm): 131 Fundal Height: 30 cm Movement: Present     General:  Alert, oriented and cooperative. Patient is in no acute distress.  Skin: Skin is warm and dry. No rash noted.   Cardiovascular: Normal heart rate noted  Respiratory: Normal respiratory effort, no problems with respiration noted  Abdomen: Soft, gravid, appropriate for gestational age.  Pain/Pressure: Present     Pelvic: Cervical exam deferred        Extremities: Normal range of motion.  Edema: None  Mental Status: Normal mood and affect. Normal behavior. Normal judgment and thought content.   Assessment and Plan:  Pregnancy: G5P0121 at 31.[redacted] wks Gestation  1. Supervision of other  normal pregnancy, antepartum - Plans to obtain SMA, hgb elect, CF, CBC, HIV, RPR when she returns for 2 hour lab - Reviewed anatomy ultrasound, SGA - Signed BTL consent, still considering options  2. Substance abuse affecting pregnancy, antepartum - Continue substance abuse counseling - Commended effort to decrease cocaine amount  3. Pregnancy with adoption planned, currently in third trimester - Obtained [facility] information  4. Vaginal discharge during pregnancy in third trimester - Cervicovaginal ancillary only (GC/CT, Trich, BV, yeast)  Preterm labor symptoms and general obstetric precautions including but not limited to vaginal bleeding, contractions, leaking of fluid and fetal movement were reviewed in detail with the patient. Please refer to After Visit Summary for other counseling recommendations.  Return in 2 weeks (on 10/19/2017).  Future Appointments  Date Time Provider Department Center  10/11/2017  2:15 PM [facility] US 4 [facility]  10/18/2017  2:15 PM [facility] US 4 [facility]  10/25/2017  2:00 PM [facility] US 3 [facility]US    Rochele PagesWalidah Karim, CNM

## 2017-10-05 NOTE — Patient Instructions (Addendum)
 Third Trimester of Pregnancy The third trimester is from week 28 through week 40 (months 7 through 9). The third trimester is a time when the unborn baby (fetus) is growing rapidly. At the end of the ninth month, the fetus is about 20 inches in length and weighs 6-10 pounds. Body changes during your third trimester Your body will continue to go through many changes during pregnancy. The changes vary from woman to woman. During the third trimester:  Your weight will continue to increase. You can expect to gain 25-35 pounds (11-16 kg) by the end of the pregnancy.  You may begin to get stretch marks on your hips, abdomen, and breasts.  You may urinate more often because the fetus is moving lower into your pelvis and pressing on your bladder.  You may develop or continue to have heartburn. This is caused by increased hormones that slow down muscles in the digestive tract.  You may develop or continue to have constipation because increased hormones slow digestion and cause the muscles that push waste through your intestines to relax.  You may develop hemorrhoids. These are swollen veins (varicose veins) in the rectum that can itch or be painful.  You may develop swollen, bulging veins (varicose veins) in your legs.  You may have increased body aches in the pelvis, back, or thighs. This is due to weight gain and increased hormones that are relaxing your joints.  You may have changes in your hair. These can include thickening of your hair, rapid growth, and changes in texture. Some women also have hair loss during or after pregnancy, or hair that feels dry or thin. Your hair will most likely return to normal after your baby is born.  Your breasts will continue to grow and they will continue to become tender. A yellow fluid (colostrum) may leak from your breasts. This is the first milk you are producing for your baby.  Your belly button may stick out.  You may notice more swelling in your  hands, face, or ankles.  You may have increased tingling or numbness in your hands, arms, and legs. The skin on your belly may also feel numb.  You may feel short of breath because of your expanding uterus.  You may have more problems sleeping. This can be caused by the size of your belly, increased need to urinate, and an increase in your body's metabolism.  You may notice the fetus "dropping," or moving lower in your abdomen (lightening).  You may have increased vaginal discharge.  You may notice your joints feel loose and you may have pain around your pelvic bone.  What to expect at prenatal visits You will have prenatal exams every 2 weeks until week 36. Then you will have weekly prenatal exams. During a routine prenatal visit:  You will be weighed to make sure you and the baby are growing normally.  Your blood pressure will be taken.  Your abdomen will be measured to track your baby's growth.  The fetal heartbeat will be listened to.  Any test results from the previous visit will be discussed.  You may have a cervical check near your due date to see if your cervix has softened or thinned (effaced).  You will be tested for Group B streptococcus. This happens between 35 and 37 weeks.  Your health care provider may ask you:  What your birth plan is.  How you are feeling.  If you are feeling the baby move.  If you have   had any abnormal symptoms, such as leaking fluid, bleeding, severe headaches, or abdominal cramping.  If you are using any tobacco products, including cigarettes, chewing tobacco, and electronic cigarettes.  If you have any questions.  Other tests or screenings that may be performed during your third trimester include:  Blood tests that check for low iron levels (anemia).  Fetal testing to check the health, activity level, and growth of the fetus. Testing is done if you have certain medical conditions or if there are problems during the  pregnancy.  Nonstress test (NST). This test checks the health of your baby to make sure there are no signs of problems, such as the baby not getting enough oxygen. During this test, a belt is placed around your belly. The baby is made to move, and its heart rate is monitored during movement.  What is false labor? False labor is a condition in which you feel small, irregular tightenings of the muscles in the womb (contractions) that usually go away with rest, changing position, or drinking water. These are called Braxton Hicks contractions. Contractions may last for hours, days, or even weeks before true labor sets in. If contractions come at regular intervals, become more frequent, increase in intensity, or become painful, you should see your health care provider. What are the signs of labor?  Abdominal cramps.  Regular contractions that start at 10 minutes apart and become stronger and more frequent with time.  Contractions that start on the top of the uterus and spread down to the lower abdomen and back.  Increased pelvic pressure and dull back pain.  A watery or bloody mucus discharge that comes from the vagina.  Leaking of amniotic fluid. This is also known as your "water breaking." It could be a slow trickle or a gush. Let your health care provider know if it has a color or strange odor. If you have any of these signs, call your health care provider right away, even if it is before your due date. Follow these instructions at home: Medicines  Follow your health care provider's instructions regarding medicine use. Specific medicines may be either safe or unsafe to take during pregnancy.  Take a prenatal vitamin that contains at least 600 micrograms (mcg) of folic acid.  If you develop constipation, try taking a stool softener if your health care provider approves. Eating and drinking  Eat a balanced diet that includes fresh fruits and vegetables, whole grains, good sources of protein  such as meat, eggs, or tofu, and low-fat dairy. Your health care provider will help you determine the amount of weight gain that is right for you.  Avoid raw meat and uncooked cheese. These carry germs that can cause birth defects in the baby.  If you have low calcium intake from food, talk to your health care provider about whether you should take a daily calcium supplement.  Eat four or five small meals rather than three large meals a day.  Limit foods that are high in fat and processed sugars, such as fried and sweet foods.  To prevent constipation: ? Drink enough fluid to keep your urine clear or pale yellow. ? Eat foods that are high in fiber, such as fresh fruits and vegetables, whole grains, and beans. Activity  Exercise only as directed by your health care provider. Most women can continue their usual exercise routine during pregnancy. Try to exercise for 30 minutes at least 5 days a week. Stop exercising if you experience uterine contractions.  Avoid   heavy lifting.  Do not exercise in extreme heat or humidity, or at high altitudes.  Wear low-heel, comfortable shoes.  Practice good posture.  You may continue to have sex unless your health care provider tells you otherwise. Relieving pain and discomfort  Take frequent breaks and rest with your legs elevated if you have leg cramps or low back pain.  Take warm sitz baths to soothe any pain or discomfort caused by hemorrhoids. Use hemorrhoid cream if your health care provider approves.  Wear a good support bra to prevent discomfort from breast tenderness.  If you develop varicose veins: ? Wear support pantyhose or compression stockings as told by your healthcare provider. ? Elevate your feet for 15 minutes, 3-4 times a day. Prenatal care  Write down your questions. Take them to your prenatal visits.  Keep all your prenatal visits as told by your health care provider. This is important. Safety  Wear your seat belt at  all times when driving.  Make a list of emergency phone numbers, including numbers for family, friends, the hospital, and police and fire departments. General instructions  Avoid cat litter boxes and soil used by cats. These carry germs that can cause birth defects in the baby. If you have a cat, ask someone to clean the litter box for you.  Do not travel far distances unless it is absolutely necessary and only with the approval of your health care provider.  Do not use hot tubs, steam rooms, or saunas.  Do not drink alcohol.  Do not use any products that contain nicotine or tobacco, such as cigarettes and e-cigarettes. If you need help quitting, ask your health care provider.  Do not use any medicinal herbs or unprescribed drugs. These chemicals affect the formation and growth of the baby.  Do not douche or use tampons or scented sanitary pads.  Do not cross your legs for long periods of time.  To prepare for the arrival of your baby: ? Take prenatal classes to understand, practice, and ask questions about labor and delivery. ? Make a trial run to the hospital. ? Visit the hospital and tour the maternity area. ? Arrange for maternity or paternity leave through employers. ? Arrange for family and friends to take care of pets while you are in the hospital. ? Purchase a rear-facing car seat and make sure you know how to install it in your car. ? Pack your hospital bag. ? Prepare the baby's nursery. Make sure to remove all pillows and stuffed animals from the baby's crib to prevent suffocation.  Visit your dentist if you have not gone during your pregnancy. Use a soft toothbrush to brush your teeth and be gentle when you floss. Contact a health care provider if:  You are unsure if you are in labor or if your water has broken.  You become dizzy.  You have mild pelvic cramps, pelvic pressure, or nagging pain in your abdominal area.  You have lower back pain.  You have persistent  nausea, vomiting, or diarrhea.  You have an unusual or bad smelling vaginal discharge.  You have pain when you urinate. Get help right away if:  Your water breaks before 37 weeks.  You have regular contractions less than 5 minutes apart before 37 weeks.  You have a fever.  You are leaking fluid from your vagina.  You have spotting or bleeding from your vagina.  You have severe abdominal pain or cramping.  You have rapid weight loss or weight   gain.  You have shortness of breath with chest pain.  You notice sudden or extreme swelling of your face, hands, ankles, feet, or legs.  Your baby makes fewer than 10 movements in 2 hours.  You have severe headaches that do not go away when you take medicine.  You have vision changes. Summary  The third trimester is from week 28 through week 40, months 7 through 9. The third trimester is a time when the unborn baby (fetus) is growing rapidly.  During the third trimester, your discomfort may increase as you and your baby continue to gain weight. You may have abdominal, leg, and back pain, sleeping problems, and an increased need to urinate.  During the third trimester your breasts will keep growing and they will continue to become tender. A yellow fluid (colostrum) may leak from your breasts. This is the first milk you are producing for your baby.  False labor is a condition in which you feel small, irregular tightenings of the muscles in the womb (contractions) that eventually go away. These are called Braxton Hicks contractions. Contractions may last for hours, days, or even weeks before true labor sets in.  Signs of labor can include: abdominal cramps; regular contractions that start at 10 minutes apart and become stronger and more frequent with time; watery or bloody mucus discharge that comes from the vagina; increased pelvic pressure and dull back pain; and leaking of amniotic fluid. This information is not intended to replace advice  given to you by your health care provider. Make sure you discuss any questions you have with your health care provider. Document Released: 01/24/2001 Document Revised: 07/08/2015 Document Reviewed: 04/02/2012 Elsevier Interactive Patient Education  2017 [employer].   Contraception Choices Contraception, also called birth control, refers to methods or devices that prevent pregnancy. Hormonal methods Contraceptive implant A contraceptive implant is a thin, plastic tube that contains a hormone. It is inserted into the upper part of the arm. It can remain in place for up to 3 years. Progestin-only injections Progestin-only injections are injections of progestin, a synthetic form of the hormone progesterone. They are given every 3 months by a health care provider. Birth control pills Birth control pills are pills that contain hormones that prevent pregnancy. They must be taken once a day, preferably at the same time each day. Birth control patch The birth control patch contains hormones that prevent pregnancy. It is placed on the skin and must be changed once a week for three weeks and removed on the fourth week. A prescription is needed to use this method of contraception. Vaginal ring A vaginal ring contains hormones that prevent pregnancy. It is placed in the vagina for three weeks and removed on the fourth week. After that, the process is repeated with a new ring. A prescription is needed to use this method of contraception. Emergency contraceptive Emergency contraceptives prevent pregnancy after unprotected sex. They come in pill form and can be taken up to 5 days after sex. They work best the sooner they are taken after having sex. Most emergency contraceptives are available without a prescription. This method should not be used as your only form of birth control. Barrier methods Female condom A female condom is a thin sheath that is worn over the penis during sex. Condoms keep sperm from going  inside a woman's body. They can be used with a spermicide to increase their effectiveness. They should be disposed after a single use. Female condom A female condom is a  soft, loose-fitting sheath that is put into the vagina before sex. The condom keeps sperm from going inside a woman's body. They should be disposed after a single use. Diaphragm A diaphragm is a soft, dome-shaped barrier. It is inserted into the vagina before sex, along with a spermicide. The diaphragm blocks sperm from entering the uterus, and the spermicide kills sperm. A diaphragm should be left in the vagina for 6-8 hours after sex and removed within 24 hours. A diaphragm is prescribed and fitted by a health care provider. A diaphragm should be replaced every 1-2 years, after giving birth, after gaining more than 15 lb (6.8 kg), and after pelvic surgery. Cervical cap A cervical cap is a round, soft latex or plastic cup that fits over the cervix. It is inserted into the vagina before sex, along with spermicide. It blocks sperm from entering the uterus. The cap should be left in place for 6-8 hours after sex and removed within 48 hours. A cervical cap must be prescribed and fitted by a health care provider. It should be replaced every 2 years. Sponge A sponge is a soft, circular piece of polyurethane foam with spermicide on it. The sponge helps block sperm from entering the uterus, and the spermicide kills sperm. To use it, you make it wet and then insert it into the vagina. It should be inserted before sex, left in for at least 6 hours after sex, and removed and thrown away within 30 hours. Spermicides Spermicides are chemicals that kill or block sperm from entering the cervix and uterus. They can come as a cream, jelly, suppository, foam, or tablet. A spermicide should be inserted into the vagina with an applicator at least 27-06 minutes before sex to allow time for it to work. The process must be repeated every time you have sex.  Spermicides do not require a prescription. Intrauterine contraception Intrauterine device (IUD) An IUD is a T-shaped device that is put in a woman's uterus. There are two types:  Hormone IUD.This type contains progestin, a synthetic form of the hormone progesterone. This type can stay in place for 3-5 years.  Copper IUD.This type is wrapped in copper wire. It can stay in place for 10 years.  Permanent methods of contraception Female tubal ligation In this method, a woman's fallopian tubes are sealed, tied, or blocked during surgery to prevent eggs from traveling to the uterus. Hysteroscopic sterilization In this method, a small, flexible insert is placed into each fallopian tube. The inserts cause scar tissue to form in the fallopian tubes and block them, so sperm cannot reach an egg. The procedure takes about 3 months to be effective. Another form of birth control must be used during those 3 months. Female sterilization This is a procedure to tie off the tubes that carry sperm (vasectomy). After the procedure, the man can still ejaculate fluid (semen). Natural planning methods Natural family planning In this method, a couple does not have sex on days when the woman could become pregnant. Calendar method This means keeping track of the length of each menstrual cycle, identifying the days when pregnancy can happen, and not having sex on those days. Ovulation method In this method, a couple avoids sex during ovulation. Symptothermal method This method involves not having sex during ovulation. The woman typically checks for ovulation by watching changes in her temperature and in the consistency of cervical mucus. Post-ovulation method In this method, a couple waits to have sex until after ovulation. Summary  Contraception, also  called birth control, means methods or devices that prevent pregnancy.  Hormonal methods of contraception include implants, injections, pills, patches, vaginal  rings, and emergency contraceptives.  Barrier methods of contraception can include female condoms, female condoms, diaphragms, cervical caps, sponges, and spermicides.  There are two types of IUDs (intrauterine devices). An IUD can be put in a woman's uterus to prevent pregnancy for 3-5 years.  Permanent sterilization can be done through a procedure for males, females, or both.  Natural family planning methods involve not having sex on days when the woman could become pregnant. This information is not intended to replace advice given to you by your health care provider. Make sure you discuss any questions you have with your health care provider. Document Released: 01/30/2005 Document Revised: 03/04/2016 Document Reviewed: 03/04/2016 Elsevier Interactive Patient Education  2018 [employer]. Influenza (Flu) Vaccine (Inactivated or Recombinant): What You Need to Know 1. Why get vaccinated? Influenza ("flu") is a contagious disease that spreads around the [place] every year, usually between October and May. Flu is caused by influenza viruses, and is spread mainly by coughing, sneezing, and close contact. Anyone can get flu. Flu strikes suddenly and can last several days. Symptoms vary by age, but can include:  fever/chills  sore throat  muscle aches  fatigue  cough  headache  runny or stuffy nose  Flu can also lead to pneumonia and blood infections, and cause diarrhea and seizures in children. If you have a medical condition, such as heart or lung disease, flu can make it worse. Flu is more dangerous for some people. Infants and young children, people 92 years of age and older, pregnant women, and people with certain health conditions or a weakened immune system are at greatest risk. Each year thousands of people in the [place] die from flu, and many more are hospitalized. Flu vaccine can:  keep you from getting flu,  make flu less severe if you do get it, and  keep  you from spreading flu to your family and other people. 2. Inactivated and recombinant flu vaccines A dose of flu vaccine is recommended every flu season. Children 6 months through 29 years of age may need two doses during the same flu season. Everyone else needs only one dose each flu season. Some inactivated flu vaccines contain a very small amount of a mercury-based preservative called thimerosal. Studies have not shown thimerosal in vaccines to be harmful, but flu vaccines that do not contain thimerosal are available. There is no live flu virus in flu shots. They cannot cause the flu. There are many flu viruses, and they are always changing. Each year a new flu vaccine is made to protect against three or four viruses that are likely to cause disease in the upcoming flu season. But even when the vaccine doesn't exactly match these viruses, it may still provide some protection. Flu vaccine cannot prevent:  flu that is caused by a virus not covered by the vaccine, or  illnesses that look like flu but are not.  It takes about 2 weeks for protection to develop after vaccination, and protection lasts through the flu season. 3. Some people should not get this vaccine Tell the person who is giving you the vaccine:  If you have any severe, life-threatening allergies. If you ever had a life-threatening allergic reaction after a dose of flu vaccine, or have a severe allergy to any part of this vaccine, you may be advised not to get vaccinated. Most, but  not all, types of flu vaccine contain a small amount of egg protein.  If you ever had Guillain-Barr Syndrome (also called GBS). Some people with a history of GBS should not get this vaccine. This should be discussed with your doctor.  If you are not feeling well. It is usually okay to get flu vaccine when you have a mild illness, but you might be asked to come back when you feel better.  4. Risks of a vaccine reaction With any medicine, including  vaccines, there is a chance of reactions. These are usually mild and go away on their own, but serious reactions are also possible. Most people who get a flu shot do not have any problems with it. Minor problems following a flu shot include:  soreness, redness, or swelling where the shot was given  hoarseness  sore, red or itchy eyes  cough  fever  aches  headache  itching  fatigue  If these problems occur, they usually begin soon after the shot and last 1 or 2 days. More serious problems following a flu shot can include the following:  There may be a small increased risk of Guillain-Barre Syndrome (GBS) after inactivated flu vaccine. This risk has been estimated at 1 or 2 additional cases per million people vaccinated. This is much lower than the risk of severe complications from flu, which can be prevented by flu vaccine.  Young children who get the flu shot along with pneumococcal vaccine (PCV13) and/or DTaP vaccine at the same time might be slightly more likely to have a seizure caused by fever. Ask your doctor for more information. Tell your doctor if a child who is getting flu vaccine has ever had a seizure.  Problems that could happen after any injected vaccine:  People sometimes faint after a medical procedure, including vaccination. Sitting or lying down for about 15 minutes can help prevent fainting, and injuries caused by a fall. Tell your doctor if you feel dizzy, or have vision changes or ringing in the ears.  Some people get severe pain in the shoulder and have difficulty moving the arm where a shot was given. This happens very rarely.  Any medication can cause a severe allergic reaction. Such reactions from a vaccine are very rare, estimated at about 1 in a million doses, and would happen within a few minutes to a few hours after the vaccination. As with any medicine, there is a very remote chance of a vaccine causing a serious injury or death. The safety of  vaccines is always being monitored. For more information, visit: http://floyd.org/ 5. What if there is a serious reaction? What should I look for? Look for anything that concerns you, such as signs of a severe allergic reaction, very high fever, or unusual behavior. Signs of a severe allergic reaction can include hives, swelling of the face and throat, difficulty breathing, a fast heartbeat, dizziness, and weakness. These would start a few minutes to a few hours after the vaccination. What should I do?  If you think it is a severe allergic reaction or other emergency that can't wait, call 9-1-1 and get the person to the nearest hospital. Otherwise, call your doctor.  Reactions should be reported to the Vaccine Adverse Event Reporting System (VAERS). Your doctor should file this report, or you can do it yourself through the VAERS web site at www.vaers.LAgents.no, or by calling 1-7607565967. ? VAERS does not give medical advice. 6. The National Vaccine Injury Compensation Program The [employer] Vaccine Injury  Compensation Program Water quality scientist(VICP) is a Stage managerfederal program that was created to compensate people who may have been injured by certain vaccines. Persons who believe they may have been injured by a vaccine can learn about the program and about filing a claim by calling 1-(239)068-2640 or visiting the VICP website at SpiritualWord.atwww.hrsa.gov/vaccinecompensation. There is a time limit to file a claim for compensation. 7. How can I learn more?  Ask your healthcare provider. He or she can give you the vaccine package insert or suggest other sources of information.  Call your local or state health department.  Contact the Centers for Disease Control and Prevention (CDC): ? Call 402 821 24361-(478) 219-9193 (1-800-CDC-INFO) or ? Visit CDC's website at BiotechRoom.com.cywww.cdc.gov/flu Vaccine Information Statement, Inactivated Influenza Vaccine (09/19/2013) This information is not intended to replace advice given to you by your health care  provider. Make sure you discuss any questions you have with your health care provider. Document Released: 11/24/2005 Document Revised: 10/21/2015 Document Reviewed: 10/21/2015 Elsevier Interactive Patient Education  2017 [employer]

## 2017-10-08 ENCOUNTER — Encounter: Payer: Self-pay | Admitting: General Practice

## 2017-10-08 LAB — CERVICOVAGINAL ANCILLARY ONLY
BACTERIAL VAGINITIS: NEGATIVE
CANDIDA VAGINITIS: POSITIVE — AB
Chlamydia: NEGATIVE
NEISSERIA GONORRHEA: NEGATIVE
TRICH (WINDOWPATH): NEGATIVE

## 2017-10-09 ENCOUNTER — Telehealth: Payer: Self-pay | Admitting: *Deleted

## 2017-10-09 MED ORDER — TERCONAZOLE 0.4 % VA CREA: 1.0000 | TOPICAL_CREAM | Freq: Every day | VAGINAL | 0 refills | Status: DC

## 2017-10-09 NOTE — Telephone Encounter (Signed)
-----   Message from Marlis EdelsonWalidah N Karim, CNM sent at 10/09/2017  2:25 PM EDT ----- Laury DeepX Terazol sent to pharmacy.  Pt will be notified by RN via phone regarding prescription.

## 2017-10-09 NOTE — Addendum Note (Signed)
Addended by: Marlis EdelsonKARIM, Aymara Sassi N on: 10/09/2017 02:25 PM   Modules accepted: Orders

## 2017-10-09 NOTE — Telephone Encounter (Signed)
Pt is aware of + yeast. Terazol 0.4% vaginal cream 1 app at bedtime x 7 days sent to her pharmacy.  Pt stated understanding.  Clovis PuMartin, Aryav Wimberly L, RN

## 2017-10-11 ENCOUNTER — Ambulatory Visit (HOSPITAL_COMMUNITY)
Admission: RE | Admit: 2017-10-11 | Discharge: 2017-10-11 | Disposition: A | Discharge status: Home / Self Care | Payer: Medicaid Other | Source: Ambulatory Visit | Attending: Advanced Practice Midwife | Admitting: Advanced Practice Midwife

## 2017-10-11 ENCOUNTER — Encounter (HOSPITAL_COMMUNITY): Payer: Self-pay

## 2017-10-17 ENCOUNTER — Encounter (HOSPITAL_COMMUNITY): Payer: Self-pay

## 2017-10-18 ENCOUNTER — Encounter (HOSPITAL_COMMUNITY): Payer: Self-pay

## 2017-10-18 ENCOUNTER — Other Ambulatory Visit: Payer: Self-pay | Admitting: *Deleted

## 2017-10-18 ENCOUNTER — Ambulatory Visit (HOSPITAL_COMMUNITY)
Admission: RE | Admit: 2017-10-18 | Discharge: 2017-10-18 | Disposition: A | Discharge status: Home / Self Care | Payer: Medicaid Other | Source: Ambulatory Visit | Attending: Advanced Practice Midwife | Admitting: Advanced Practice Midwife

## 2017-10-19 ENCOUNTER — Encounter: Payer: Self-pay | Admitting: Obstetrics and Gynecology

## 2017-10-22 ENCOUNTER — Telehealth: Payer: Self-pay | Admitting: General Practice

## 2017-10-22 NOTE — Telephone Encounter (Signed)
Left message for patient to give our office a call to schedule follow up OB appt.

## 2017-10-23 ENCOUNTER — Encounter: Payer: Self-pay | Admitting: Family

## 2017-10-25 ENCOUNTER — Ambulatory Visit (HOSPITAL_COMMUNITY): Admission: RE | Admit: 2017-10-25 | Payer: Medicaid Other | Source: Ambulatory Visit

## 2017-10-26 ENCOUNTER — Ambulatory Visit (HOSPITAL_COMMUNITY): Admission: RE | Admit: 2017-10-26 | Payer: Medicaid Other | Source: Ambulatory Visit

## 2017-11-08 ENCOUNTER — Ambulatory Visit (HOSPITAL_COMMUNITY): Admission: RE | Admit: 2017-11-08 | Payer: Medicaid Other | Source: Ambulatory Visit

## 2017-11-13 ENCOUNTER — Encounter (HOSPITAL_COMMUNITY): Payer: Self-pay | Admitting: *Deleted

## 2017-11-13 ENCOUNTER — Inpatient Hospital Stay (HOSPITAL_COMMUNITY): Payer: Medicaid Other | Admitting: Anesthesiology

## 2017-11-13 ENCOUNTER — Inpatient Hospital Stay (HOSPITAL_COMMUNITY)
Admission: AD | Admit: 2017-11-13 | Discharge: 2017-11-15 | DRG: 806 | Disposition: A | Discharge status: Home / Self Care | Payer: Medicaid Other | Attending: Obstetrics and Gynecology | Admitting: Obstetrics and Gynecology

## 2017-11-13 DIAGNOSIS — O365930 Maternal care for other known or suspected poor fetal growth, third trimester, not applicable or unspecified: Principal | ICD-10-CM | POA: Diagnosis present

## 2017-11-13 DIAGNOSIS — Z3483 Encounter for supervision of other normal pregnancy, third trimester: Secondary | ICD-10-CM | POA: Diagnosis present

## 2017-11-13 DIAGNOSIS — O99324 Drug use complicating childbirth: Secondary | ICD-10-CM | POA: Diagnosis present

## 2017-11-13 DIAGNOSIS — Z3A37 37 weeks gestation of pregnancy: Secondary | ICD-10-CM

## 2017-11-13 DIAGNOSIS — O99320 Drug use complicating pregnancy, unspecified trimester: Secondary | ICD-10-CM

## 2017-11-13 DIAGNOSIS — F1290 Cannabis use, unspecified, uncomplicated: Secondary | ICD-10-CM | POA: Diagnosis present

## 2017-11-13 DIAGNOSIS — Z3493 Encounter for supervision of normal pregnancy, unspecified, third trimester: Secondary | ICD-10-CM

## 2017-11-13 DIAGNOSIS — O99314 Alcohol use complicating childbirth: Secondary | ICD-10-CM | POA: Diagnosis present

## 2017-11-13 DIAGNOSIS — F1490 Cocaine use, unspecified, uncomplicated: Secondary | ICD-10-CM | POA: Diagnosis present

## 2017-11-13 DIAGNOSIS — Z5900 Homelessness unspecified: Secondary | ICD-10-CM

## 2017-11-13 DIAGNOSIS — O0990 Supervision of high risk pregnancy, unspecified, unspecified trimester: Secondary | ICD-10-CM

## 2017-11-13 HISTORY — DX: Other specified health status: Z78.9

## 2017-11-13 LAB — CBC
HEMATOCRIT: 31.2 % — AB (ref 36.0–46.0)
HEMOGLOBIN: 10.8 g/dL — AB (ref 12.0–15.0)
MCH: 29.3 pg (ref 26.0–34.0)
MCHC: 34.6 g/dL (ref 30.0–36.0)
MCV: 84.6 fL (ref 78.0–100.0)
Platelets: 290 10*3/uL (ref 150–400)
RBC: 3.69 MIL/uL — ABNORMAL LOW (ref 3.87–5.11)
RDW: 13 % (ref 11.5–15.5)
WBC: 10.4 10*3/uL (ref 4.0–10.5)

## 2017-11-13 LAB — RAPID URINE DRUG SCREEN, HOSP PERFORMED
Amphetamines: NOT DETECTED
Barbiturates: NOT DETECTED
Benzodiazepines: NOT DETECTED
COCAINE: POSITIVE — AB
OPIATES: NOT DETECTED
Tetrahydrocannabinol: POSITIVE — AB

## 2017-11-13 LAB — GROUP B STREP BY PCR: GROUP B STREP BY PCR: NEGATIVE

## 2017-11-13 LAB — RPR: RPR: NONREACTIVE

## 2017-11-13 LAB — OB RESULTS CONSOLE GBS: GBS: NEGATIVE

## 2017-11-13 LAB — TYPE AND SCREEN
ABO/RH(D): A POS
Antibody Screen: NEGATIVE

## 2017-11-13 MED ORDER — FENTANYL CITRATE (PF) 100 MCG/2ML IJ SOLN: 100.0000 ug | INTRAMUSCULAR | Status: DC | PRN

## 2017-11-13 MED ORDER — FLEET ENEMA 7-19 GM/118ML RE ENEM: 1.0000 | ENEMA | RECTAL | Status: DC | PRN

## 2017-11-13 MED ORDER — DIBUCAINE 1 % RE OINT: 1.0000 "application " | TOPICAL_OINTMENT | RECTAL | Status: DC | PRN

## 2017-11-13 MED ORDER — CEFAZOLIN SODIUM-DEXTROSE 2-4 GM/100ML-% IV SOLN: 2.0000 g | Freq: Three times a day (TID) | INTRAVENOUS | Status: DC

## 2017-11-13 MED ORDER — FENTANYL 2.5 MCG/ML BUPIVACAINE 1/10 % EPIDURAL INFUSION (WH - ANES)
INTRAMUSCULAR | Status: DC | PRN
  Administered 2017-11-13: 14 mL/h via EPIDURAL

## 2017-11-13 MED ORDER — SOD CITRATE-CITRIC ACID 500-334 MG/5ML PO SOLN: 30.0000 mL | ORAL | Status: DC | PRN

## 2017-11-13 MED ORDER — ONDANSETRON HCL 4 MG/2ML IJ SOLN: 4.0000 mg | INTRAMUSCULAR | Status: DC | PRN

## 2017-11-13 MED ORDER — CEFAZOLIN SODIUM-DEXTROSE 2-4 GM/100ML-% IV SOLN
2.0000 g | Freq: Once | INTRAVENOUS | Status: AC
  Administered 2017-11-13: 2 g via INTRAVENOUS
  Filled 2017-11-13: qty 100

## 2017-11-13 MED ORDER — LIDOCAINE HCL (PF) 1 % IJ SOLN
30.0000 mL | INTRAMUSCULAR | Status: DC | PRN
  Filled 2017-11-13: qty 30

## 2017-11-13 MED ORDER — WITCH HAZEL-GLYCERIN EX PADS: 1.0000 "application " | MEDICATED_PAD | CUTANEOUS | Status: DC | PRN

## 2017-11-13 MED ORDER — IBUPROFEN 600 MG PO TABS
600.0000 mg | ORAL_TABLET | Freq: Four times a day (QID) | ORAL | Status: DC
  Filled 2017-11-13: qty 1

## 2017-11-13 MED ORDER — LACTATED RINGERS IV SOLN
INTRAVENOUS | Status: DC
  Administered 2017-11-13: 02:00:00 via INTRAVENOUS

## 2017-11-13 MED ORDER — OXYTOCIN 40 UNITS IN LACTATED RINGERS INFUSION - SIMPLE MED
2.5000 [IU]/h | INTRAVENOUS | Status: DC
  Filled 2017-11-13: qty 1000

## 2017-11-13 MED ORDER — BENZOCAINE-MENTHOL 20-0.5 % EX AERO
1.0000 "application " | INHALATION_SPRAY | CUTANEOUS | Status: DC | PRN
  Filled 2017-11-13: qty 56

## 2017-11-13 MED ORDER — FENTANYL CITRATE (PF) 100 MCG/2ML IJ SOLN
100.0000 ug | INTRAMUSCULAR | Status: DC | PRN
  Administered 2017-11-13: 100 ug via INTRAVENOUS
  Filled 2017-11-13: qty 2

## 2017-11-13 MED ORDER — ZOLPIDEM TARTRATE 5 MG PO TABS: 5.0000 mg | ORAL_TABLET | Freq: Every evening | ORAL | Status: DC | PRN

## 2017-11-13 MED ORDER — IBUPROFEN 100 MG/5ML PO SUSP
600.0000 mg | Freq: Four times a day (QID) | ORAL | Status: DC
  Administered 2017-11-13 – 2017-11-15 (×8): 600 mg via ORAL
  Filled 2017-11-13 (×13): qty 30

## 2017-11-13 MED ORDER — LACTATED RINGERS IV SOLN
500.0000 mL | INTRAVENOUS | Status: DC | PRN
  Administered 2017-11-13: 1000 mL via INTRAVENOUS

## 2017-11-13 MED ORDER — SIMETHICONE 80 MG PO CHEW: 80.0000 mg | CHEWABLE_TABLET | ORAL | Status: DC | PRN

## 2017-11-13 MED ORDER — PHENYLEPHRINE 40 MCG/ML (10ML) SYRINGE FOR IV PUSH (FOR BLOOD PRESSURE SUPPORT)
PREFILLED_SYRINGE | INTRAVENOUS | Status: AC
  Filled 2017-11-13: qty 10

## 2017-11-13 MED ORDER — DIPHENHYDRAMINE HCL 25 MG PO CAPS: 25.0000 mg | ORAL_CAPSULE | Freq: Four times a day (QID) | ORAL | Status: DC | PRN

## 2017-11-13 MED ORDER — PNEUMOCOCCAL VAC POLYVALENT 25 MCG/0.5ML IJ INJ
0.5000 mL | INJECTION | INTRAMUSCULAR | Status: DC
  Filled 2017-11-13: qty 0.5

## 2017-11-13 MED ORDER — ONDANSETRON HCL 4 MG/2ML IJ SOLN: 4.0000 mg | Freq: Four times a day (QID) | INTRAMUSCULAR | Status: DC | PRN

## 2017-11-13 MED ORDER — ONDANSETRON HCL 4 MG PO TABS: 4.0000 mg | ORAL_TABLET | ORAL | Status: DC | PRN

## 2017-11-13 MED ORDER — COMPLETENATE 29-1 MG PO CHEW
1.0000 | CHEWABLE_TABLET | Freq: Every day | ORAL | Status: DC
  Filled 2017-11-13 (×4): qty 1

## 2017-11-13 MED ORDER — OXYTOCIN BOLUS FROM INFUSION
500.0000 mL | Freq: Once | INTRAVENOUS | Status: AC
  Administered 2017-11-13: 500 mL via INTRAVENOUS

## 2017-11-13 MED ORDER — LIDOCAINE HCL (PF) 1 % IJ SOLN
INTRAMUSCULAR | Status: DC | PRN
  Administered 2017-11-13: 6 mL via EPIDURAL
  Administered 2017-11-13: 5 mL via EPIDURAL

## 2017-11-13 MED ORDER — PRENATAL MULTIVITAMIN CH
1.0000 | ORAL_TABLET | Freq: Every day | ORAL | Status: DC
  Filled 2017-11-13: qty 1

## 2017-11-13 MED ORDER — ACETAMINOPHEN 325 MG PO TABS
650.0000 mg | ORAL_TABLET | ORAL | Status: DC | PRN
  Filled 2017-11-13: qty 2

## 2017-11-13 MED ORDER — FENTANYL 2.5 MCG/ML BUPIVACAINE 1/10 % EPIDURAL INFUSION (WH - ANES)
INTRAMUSCULAR | Status: AC
  Filled 2017-11-13: qty 100

## 2017-11-13 MED ORDER — COCONUT OIL OIL: 1.0000 "application " | TOPICAL_OIL | Status: DC | PRN

## 2017-11-13 MED ORDER — SENNOSIDES-DOCUSATE SODIUM 8.6-50 MG PO TABS
2.0000 | ORAL_TABLET | ORAL | Status: DC
  Filled 2017-11-13: qty 2

## 2017-11-13 NOTE — Anesthesia Postprocedure Evaluation (Signed)
Anesthesia Post Note  Patient: Cassidy Underwood  Procedure(s) Performed: AN AD HOC LABOR EPIDURAL     Patient location during evaluation: Mother Baby Anesthesia Type: Epidural Level of consciousness: awake and alert Pain management: pain level controlled Vital Signs Assessment: post-procedure vital signs reviewed and stable Respiratory status: spontaneous breathing, nonlabored ventilation and respiratory function stable Cardiovascular status: stable Postop Assessment: no headache, no backache and epidural receding Anesthetic complications: no    Last Vitals:  Vitals:   11/13/17 1050 11/13/17 1458  BP: 108/69 (!) 99/56  Pulse: 73 82  Resp: 18 16  Temp: 37 C 36.7 C  SpO2: 100% 100%    Last Pain:  Vitals:   11/13/17 1458  TempSrc: Oral  PainSc:    Pain Goal:                 Trellis Paganini

## 2017-11-13 NOTE — Progress Notes (Signed)
Pt states the adoptive parents are coming from [place].  She and the [name] left them a message, but unable to talk to them on the phone.  Pt stated they would have to fly from [place] to pick up the baby.

## 2017-11-13 NOTE — Anesthesia Preprocedure Evaluation (Signed)
Anesthesia Evaluation  Patient identified by MRN, date of birth, ID band Patient awake    Reviewed: Allergy & Precautions, H&P , Patient's Chart, lab work & pertinent test results  Airway Mallampati: I  TM Distance: >3 FB Neck ROM: full    Dental no notable dental hx. (+) Teeth Intact   Pulmonary neg pulmonary ROS,    Pulmonary exam normal breath sounds clear to auscultation       Cardiovascular negative cardio ROS Normal cardiovascular exam Rhythm:regular Rate:Normal     Neuro/Psych negative neurological ROS  negative psych ROS   GI/Hepatic negative GI ROS, Neg liver ROS, (+)     substance abuse  alcohol use, cocaine use and marijuana use,   Endo/Other  negative endocrine ROS  Renal/GU negative Renal ROS     Musculoskeletal   Abdominal Normal abdominal exam  (+)   Peds  Hematology negative hematology ROS (+)   Anesthesia Other Findings   Reproductive/Obstetrics (+) Pregnancy                             Anesthesia Physical Anesthesia Plan  ASA: II  Anesthesia Plan: Epidural   Post-op Pain Management:    Induction:   PONV Risk Score and Plan:   Airway Management Planned:   Additional Equipment:   Intra-op Plan:   Post-operative Plan:   Informed Consent: I have reviewed the patients History and Physical, chart, labs and discussed the procedure including the risks, benefits and alternatives for the proposed anesthesia with the patient or authorized representative who has indicated his/her understanding and acceptance.     Plan Discussed with:   Anesthesia Plan Comments:         Anesthesia Quick Evaluation

## 2017-11-13 NOTE — Progress Notes (Signed)
Patient and boyfriend state that adoptive parents are suppose to arrive today.

## 2017-11-13 NOTE — H&P (Addendum)
LABOR AND DELIVERY ADMISSION HISTORY AND PHYSICAL NOTE  Cassidy Underwood is a 22 y.o. female [phone] with IUP at [redacted]w[redacted]d by LMP presenting for regular contractions  That started about 1 hour ago.  She reports positive fetal movement. She denies leakage of fluid or vaginal bleeding.  Prenatal History/Complications: (Limited) PNC at [facility] -- ONLY ONE VISIT, rest MAU visit Pregnancy complications:  - (+) cocaine and THC use - Homeless - IUGR - [facility] (plans to be adopted by family who adopted previous child)  Past Medical History: Past Medical History:  Diagnosis Date  . Abscess   . Cocaine abuse (HCC)   . Medical history non-contributory   . Substance abuse [facility])     Past Surgical History: Past Surgical History:  Procedure Laterality Date  . DILATION AND CURETTAGE OF UTERUS    . DILATION AND EVACUATION N/A 11/09/2014   Procedure: DILATATION AND EVACUATION;  Surgeon: Levie Heritage, DO;  Location: [facility];  Service: Gynecology;  Laterality: N/A;    Obstetrical History: OB History    Gravida  4   Para  1   Term      Preterm  1   AB  2   Living  1     SAB  1   TAB      Ectopic  0   Multiple      Live Births  1           Social History: Social History   Socioeconomic History  . Marital status: Single    Spouse name: Not on file  . Number of children: Not on file  . Years of education: Not on file  . Highest education level: Not on file  Occupational History  . Not on file  Social Needs  . Financial resource strain: Very hard  . Food insecurity:    Worry: Often true    Inability: Often true  . Transportation needs:    Medical: Yes    Non-medical: Yes  Tobacco Use  . Smoking status: Never Smoker  . Smokeless tobacco: Never Used  Substance and Sexual Activity  . Alcohol use: Yes    Alcohol/week: 2.0 standard drinks    Types: 2 Cans of beer per week    Comment: 12 Nov 2017  . Drug use: Yes    Types: Marijuana, Cocaine    Comment: last  cocaine and weed use 12 Nov 2017  . Sexual activity: Yes    Birth control/protection: None  Lifestyle  . Physical activity:    Days per week: Patient refused    Minutes per session: Patient refused  . Stress: Very much  Relationships  . Social connections:    Talks on phone: Patient refused    Gets together: Patient refused    Attends religious service: Patient refused    Active member of club or organization: Patient refused    Attends meetings of clubs or organizations: Patient refused    Relationship status: Patient refused  Other Topics Concern  . Not on file  Social History Narrative   Patient reports homlessness, and current drug use; patient states she wants to get help    Family History: Family History  Adopted: Yes  Family history unknown: Yes    Allergies: No Known Allergies  Medications Prior to Admission  Medication Sig Dispense Refill Last Dose  . Prenatal Vit-Fe Fumarate-FA (PRENATAL COMPLETE) 14-0.4 MG TABS Take 1 tablet by mouth daily. (Patient not taking: Reported on 10/05/2017) 60 each 0 Unknown  at Unknown time  . terconazole (TERAZOL 7) 0.4 % vaginal cream Place 1 applicator vaginally at bedtime. 45 g 0 Unknown at Unknown time     Review of Systems  All systems reviewed and negative except as stated in HPI  Physical Exam Blood pressure 108/65, pulse 80, temperature (!) 97.4 F (36.3 C), last menstrual period 03/20/2017,not currently breastfeeding. General appearance: alert, cooperative and mild distress Lungs: clear to auscultation bilaterally Heart: regular rate and rhythm Abdomen: soft, non-tender; bowel sounds normal Extremities: No calf swelling or tenderness Presentation: cephalic; verified by bedside U/S Fetal monitoring: 115 bpm Uterine activity: regular every 4 mins Dilation: 5 Effacement (%): 90 Station: -1 Exam by:: Gearldine Bienenstock, RN  Pt informed that the ultrasound is considered a limited OB ultrasound and is not intended to be a  complete ultrasound exam.  Patient also informed that the ultrasound is not being completed with the intent of assessing for fetal or placental anomalies or any pelvic abnormalities.  Explained that the purpose of today's ultrasound is to assess for  presentation -- cephalic presentation.  Patient acknowledges the purpose of the exam and the limitations of the study.    Prenatal labs: ABO, Rh: --/--/A POS (10/01 0110) Antibody: NEG (10/01 0110) Rubella:   RPR: Nonreactive (04/11 0000)  HBsAg:    HIV: Non Reactive (03/26 0008)  GC/Chlamydia: Neg/Neg GBS:  Pending 1 hr Glucola: unknown Genetic screening:  Not done Anatomy US: Normal - IUGR  Prenatal Transfer Tool  Maternal Diabetes: No Genetic Screening: Declined - not done Maternal Ultrasounds/Referrals: Normal Fetal Ultrasounds or other Referrals:  None Maternal Substance Abuse:  Yes:  Type: Smoker, Marijuana, Cocaine, Other: EtOH Significant Maternal Medications:  None Significant Maternal Lab Results: Lab values include: Other: GBS unknown  Results for orders placed or performed during the hospital encounter of 11/13/17 (from the past 24 hour(s))  Group B strep by PCR   Collection Time: 11/13/17  1:10 AM  Result Value Ref Range   Group B strep by PCR NEGATIVE NEGATIVE  CBC   Collection Time: 11/13/17  1:10 AM  Result Value Ref Range   WBC 10.4 4.0 - 10.5 K/uL   RBC 3.69 (L) 3.87 - 5.11 MIL/uL   Hemoglobin 10.8 (L) 12.0 - 15.0 g/dL   HCT 16.1 (L) 09.6 - 04.5 %   MCV 84.6 78.0 - 100.0 fL   MCH 29.3 26.0 - 34.0 pg   MCHC 34.6 30.0 - 36.0 g/dL   RDW 40.9 81.1 - 91.4 %   Platelets 290 150 - 400 K/uL  Type and screen [facility]   Collection Time: 11/13/17  1:10 AM  Result Value Ref Range   ABO/RH(D) A POS    Antibody Screen NEG    Sample Expiration      11/16/2017 Performed at [facility], [address], [place], [place] [address]   Rapid urine drug screen (hospital performed)   Collection  Time: 11/13/17  1:32 AM  Result Value Ref Range   Opiates NONE DETECTED NONE DETECTED   Cocaine POSITIVE (A) NONE DETECTED   Benzodiazepines NONE DETECTED NONE DETECTED   Amphetamines NONE DETECTED NONE DETECTED   Tetrahydrocannabinol POSITIVE (A) NONE DETECTED   Barbiturates NONE DETECTED NONE DETECTED    Patient Active Problem List   Diagnosis Date Noted  . Indication for care in labor or delivery 11/13/2017  . Pregnancy with adoption planned 10/05/2017  . IUGR (intrauterine growth restriction) affecting care of mother 10/05/2017  . Supervision of  high risk pregnancy, antepartum 08/02/2017  . Substance abuse affecting pregnancy, antepartum 07/29/2017  . Homeless 11/02/2016    Assessment: Marchelle Rinella is a 22 y.o. [phone] at [redacted]w[redacted]d here for contractions  #Labor: Active #Pain: Fentanyl 100 mcg IV #FWB: Category 1 #ID:  GBS unknown #MOF: bottle #MOC: unsure #Circ:  unsure  Raelyn Mora, CNM 11/13/2017, 3:41 AM

## 2017-11-13 NOTE — Anesthesia Procedure Notes (Signed)
Epidural Patient location during procedure: OB Start time: 11/13/2017 2:20 AM End time: 11/13/2017 2:24 AM  Staffing Anesthesiologist: Leilani Able, MD Performed: anesthesiologist   Preanesthetic Checklist Completed: patient identified, site marked, surgical consent, pre-op evaluation, timeout performed, IV checked, risks and benefits discussed and monitors and equipment checked  Epidural Patient position: sitting Prep: site prepped and draped and DuraPrep Patient monitoring: continuous pulse ox and blood pressure Approach: midline Location: L3-L4 Injection technique: LOR air  Needle:  Needle type: Tuohy  Needle gauge: 17 G Needle length: 9 cm and 9 Needle insertion depth: 5 cm cm Catheter type: closed end flexible Catheter size: 19 Gauge Catheter at skin depth: 10 cm Test dose: negative and Other  Assessment Sensory level: T9 Events: blood not aspirated, injection not painful, no injection resistance, negative IV test and no paresthesia  Additional Notes Reason for block:procedure for pain
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
## 2017-11-13 NOTE — Progress Notes (Signed)
Mother asks for a separate private room for the adoptive parents when they arrive, charge RN states that we have no available rooms at this time and we have no legal paperwork for adoption at this time.

## 2017-11-13 NOTE — MAU Note (Signed)
Pt arrived via EMS with update of limited prenatal care, questionable ROM, ctx Q 5 min.

## 2017-11-13 NOTE — Progress Notes (Signed)
CSW received a telephone call from [facility], which is an attorney's office in [place] who is representing the adoptive parents.  Candise Bowens communicated that she worked with [name] and [name] last year with their last adoption.  CSW did not have a consent to speak with Candise Bowens and made [facility] aware.  Candise Bowens communicated that she was faxing over consents and all other adoption documents required.  Per Candise Bowens, [name]'s and [name]'s attorney will be Vanessa Kick, and the adoption social worker will be Norton Pastel.  Candise Bowens also communicated that the adopting parents are the parents that adopted [name]'s baby last year and they will be arriving to [facility] from [place] tomorrow.   CSW thanked [place] for her information.   CSW will complete a clinical assessment with [name] today.   Blaine Hamper, MSW, LCSW Clinical Social Work [phone]

## 2017-11-13 NOTE — Progress Notes (Signed)
Subjective: Cassidy Underwood is a 22 y.o. [phone] at [redacted]w[redacted]d by LMP admitted for active labor. She is comfortable now with an epidural. "Feeling much better now."  Objective: BP 108/88   Pulse 80   Temp 97.7 F (36.5 C)   Resp 16   Ht 4\' 9"  (1.448 m)   Wt 52 kg   LMP 03/20/2017 (Approximate)   SpO2 99%   BMI 24.81 kg/m    FHT:  FHR: 115 bpm, variability: moderate,  accelerations:  Present,  decelerations:  Present variable UC:   regular, every 2-4 minutes SVE:   Dilation: 8 Effacement (%): 90 Station: -1 Exam by:: Raelyn Mora, CNM Failed attempt at AROM; small blood clots removed from vagina after AROM attempt  Labs: Lab Results  Component Value Date   WBC 10.4 11/13/2017   HGB 10.8 (L) 11/13/2017   HCT 31.2 (L) 11/13/2017   MCV 84.6 11/13/2017   PLT 290 11/13/2017   UDS: (+) THC & (+) Cocaine  Assessment / Plan: Spontaneous labor, progressing normally  Labor: Progressing normally Preeclampsia:  n/a Fetal Wellbeing:  Category I Pain Control:  Epidural I/D:  n/a Anticipated MOD:  NSVD  Raelyn Mora, MSN, CNM 11/13/2017, 3:50 AM

## 2017-11-14 NOTE — Progress Notes (Signed)
Post Partum Day 1 Subjective: Cassidy Underwood is a 22 y.o [phone] female who delivered at [redacted]w[redacted]d. She delivered via SVD and placenta required manual removal. She states she is "good but I still have cramping". She states she has been out of bed since delivery. She reports eating without nausea. Denies RUQ pain, vision change, and HA. She reports that her vaginal bleeding is "better". Baby is up for adoption. When asked if she was ready to go home she stated "I have nowhere to go".  Objective: Blood pressure 105/60, pulse 71, temperature 98.2 F (36.8 C), temperature source Oral, resp. rate 18, height 4\' 9"  (1.448 m), weight 52 kg, last menstrual period 03/20/2017, SpO2 100 %, unknown if currently breastfeeding.  Physical Exam:  General: alert and cooperative  Heart: Regular rate and rhythm with no murmurs noted.  Lungs: Clear to auscultation bilaterally.  Lochia: appropriate Uterine Fundus: firm DVT Evaluation: Negative Homan's sign. No tenderness to palpation of th calf and ankle bilaterally. No significant calf/ankle edema.  Recent Labs    11/13/17 0110  HGB 10.8*  HCT 31.2*    Assessment/Plan: Assessment: Cassidy Underwood is a 9 y,o female who is PPD#1. She a is tolerating PO intake and ambulation well. Patient has a history of polysubstance use and homelessness. Plan: Social work consult today due to social concerns. Continue to monitor for increased bleeding or pain.     LOS: 1 day   Charyl Dancer 11/14/2017, 7:48 AM

## 2017-11-15 MED ORDER — IBUPROFEN 100 MG/5ML PO SUSP: 600.0000 mg | Freq: Four times a day (QID) | ORAL | 0 refills | Status: DC

## 2017-11-15 MED ORDER — MEDROXYPROGESTERONE ACETATE 150 MG/ML IM SUSP: 150.0000 mg | Freq: Once | INTRAMUSCULAR | Status: DC
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
## 2017-11-15 NOTE — Discharge Summary (Addendum)
Postpartum Discharge Summary     Patient Name: Cassidy Underwood DOB: 01/30/1996 MRN: [id number]  Date of admission: 11/13/2017 Delivering Provider: Raelyn Mora   Date of discharge: 11/15/2017  Admitting diagnosis: 36WKS CTX, POSSIBLE RUPTURE Intrauterine pregnancy: [redacted]w[redacted]d     Secondary diagnosis:  Active Problems:   Indication for care in labor or delivery  Additional problems: +cocaine and THC use, homelessness, IUGR, BUFA     Discharge diagnosis: Preterm Pregnancy Delivered                                                                                                Post partum procedures:none  Augmentation: None  Complications: manual extraction of placenta  Hospital course:  Onset of Labor With Vaginal Delivery     22 y.o. yo [id number] at [redacted]w[redacted]d was admitted in Active Labor on 11/13/2017. Patient had an uncomplicated labor course as follows:  Membrane Rupture Time/Date: 6:50 AM ,11/13/2017   Intrapartum Procedures: Episiotomy: None [1]                                         Lacerations:  None [1]  Patient had a delivery of a Viable infant. 11/13/2017  Information for the patient's newborn:  Cassidy, Underwood [[id number]]  Delivery Method: Vaginal, Spontaneous(Filed from Delivery Summary)    Pateint had an uncomplicated postpartum course.  She is ambulating, tolerating a regular diet, passing flatus, and urinating well. Patient is discharged home in stable condition on 11/15/17.   Magnesium Sulfate recieved: No BMZ received: No  Physical exam  Vitals:   11/13/17 2220 11/14/17 0628 11/14/17 2334 11/15/17 0522  BP: (!) 109/54 105/60 106/66 116/72  Pulse: 67 71 75 75  Resp: 16 18 18 18   Temp: 98.2 F (36.8 C) 98.2 F (36.8 C) 98.1 F (36.7 C) 98.2 F (36.8 C)  TempSrc: Oral Oral Oral Oral  SpO2: 100% 100%    Weight:      Height:        Labs: Lab Results  Component Value Date   WBC 10.4 11/13/2017   HGB 10.8 (L) 11/13/2017   HCT 31.2 (L) 11/13/2017   MCV 84.6  11/13/2017   PLT 290 11/13/2017   CMP Latest Ref Rng & Units 04/29/2017  Glucose 65 - 99 mg/dL 696(E)  BUN 6 - 20 mg/dL 6  Creatinine 9.52 - 8.41 mg/dL 3.24  Sodium 401 - 027 mmol/L 135  Potassium 3.5 - 5.1 mmol/L 3.3(L)  Chloride 101 - 111 mmol/L 105  CO2 22 - 32 mmol/L 22  Calcium 8.9 - 10.3 mg/dL 8.9  Total Protein 6.5 - 8.1 g/dL -  Total Bilirubin 0.3 - 1.2 mg/dL -  Alkaline Phos 38 - 253 U/L -  AST 15 - 41 U/L -  ALT 14 - 54 U/L -    Discharge instruction: per After Visit Summary and "Baby and Me Booklet".  After visit meds:  Allergies as of 11/15/2017   No Known Allergies     Medication List  STOP taking these medications   terconazole 0.4 % vaginal cream Commonly known as:  TERAZOL 7     TAKE these medications   ibuprofen 100 MG/5ML suspension Commonly known as:  ADVIL,MOTRIN Take 30 mLs (600 mg total) by mouth every 6 (six) hours.       Diet: routine diet  Activity: Advance as tolerated. Pelvic rest for 6 weeks.   Outpatient follow up:2 weeks Follow up Appt: Future Appointments  Date Time Provider Department Center  12/04/2017  1:30 PM [facility]  12/20/2017 10:10 AM Raelyn Mora, CNM [facility] None   Follow up Visit:No follow-ups on file.   Please schedule this patient for Postpartum visit in: 2 week [facility] assessment with the following provider: [facility] Clinician For C/S patients schedule nurse incision check in weeks 2 weeks: no Low risk pregnancy complicated by: +cocaine and THC use, homelessness, IUGR, BUFA Delivery mode:  SVD (precipitous) Anticipated Birth Control:  PP Procedures needed: none  Schedule Integrated BH visit: yes      Newborn Data: Live born female  Birth Weight: 4 lb 15.7 oz (2259 g) APGAR: 9, 9  Newborn Delivery   Birth date/time:  11/13/2017 06:50:00 Delivery type:  Vaginal, Spontaneous     Baby Feeding:   Disposition:adoption    11/15/2017 Cassidy Manis, DO  CNM attestation I  have seen and examined this patient and agree with above documentation in the resident's note.   Cassidy Underwood is a 22 y.o. [id number] s/p SVD.   Pain is well controlled.  Plan for birth control is Depo-Provera- prior to d/c.  Method of Feeding: n/a (adoption)  PE:  BP 116/72 (BP Location: Left Arm)   Pulse 75   Temp 98.2 F (36.8 C) (Oral)   Resp 18   Ht 4\' 9"  (1.448 m)   Wt 52 kg   LMP 03/20/2017 (Approximate)   SpO2 100%   Breastfeeding? Unknown   BMI 24.81 kg/m  Fundus firm  No results for input(s): HGB, HCT in the last 72 hours.   Plan: discharge today - postpartum care discussed - f/u clinic in 2 weeks for postpartum visit/BH visit   Cassidy Underwood, CNM 12:04 AM

## 2017-11-15 NOTE — Discharge Instructions (Signed)
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
## 2017-11-16 NOTE — Progress Notes (Signed)
CSW informed parents that Geologist, engineering K. Jimmye Norman wanting to met with parents prior to discharge.  [name] and [name] communicated they were not meeting with CPS and immediately left [name]'s room for discharge.   CPS worker came and met with adopting parents.  There are no barriers to infant discharging to adoptive parents.   Laurey Arrow, MSW, LCSW Clinical Social Work [phone]

## 2017-11-22 NOTE — Progress Notes (Signed)
CSW faxed positve CDS to [facility] CPS worker K. Williams at [phone].   Stacy Gardner, LCSW Clinical Social Worker  [facility] [employer] [place]  [phone]

## 2017-12-04 ENCOUNTER — Institutional Professional Consult (permissible substitution): Payer: Self-pay

## 2017-12-20 ENCOUNTER — Ambulatory Visit: Payer: Self-pay | Admitting: Obstetrics and Gynecology

## 2018-02-13 NOTE — L&D Delivery Note (Signed)
Delivery Note At 6:34 AM a viable female was delivered via Vaginal, Spontaneous (Presentation: ROA).  APGAR: 8, 9; weight pending.   Placenta status: spontaneous, intact.  Cord: 3 vessels  Anesthesia:  epidural Episiotomy: None Lacerations: None Suture Repair: n/a Est. Blood Loss (mL):  200  Mom to postpartum.  Baby to Couplet care / Skin to Skin.  Wende Mott CNM 12/16/2018, 6:44 AM

## 2018-02-22 ENCOUNTER — Emergency Department
Admission: EM | Admit: 2018-02-22 | Discharge: 2018-02-22 | Disposition: A | Discharge status: Other Acute Inpatient Hospital | Payer: Medicaid Other | Attending: Student in an Organized Health Care Education/Training Program | Admitting: Student in an Organized Health Care Education/Training Program

## 2018-02-22 ENCOUNTER — Inpatient Hospital Stay
Admission: AD | Admit: 2018-02-22 | Discharge: 2018-02-26 | DRG: 885 | Disposition: A | Discharge status: Home / Self Care | Payer: No Typology Code available for payment source | Source: Intra-hospital | Attending: Psychiatry | Admitting: Psychiatry

## 2018-02-22 ENCOUNTER — Encounter: Payer: Self-pay | Admitting: Psychiatry

## 2018-02-22 ENCOUNTER — Other Ambulatory Visit: Payer: Self-pay

## 2018-02-22 DIAGNOSIS — Z590 Homelessness: Secondary | ICD-10-CM | POA: Diagnosis not present

## 2018-02-22 DIAGNOSIS — R45851 Suicidal ideations: Secondary | ICD-10-CM | POA: Diagnosis present

## 2018-02-22 DIAGNOSIS — F1994 Other psychoactive substance use, unspecified with psychoactive substance-induced mood disorder: Secondary | ICD-10-CM | POA: Diagnosis not present

## 2018-02-22 DIAGNOSIS — Z791 Long term (current) use of non-steroidal anti-inflammatories (NSAID): Secondary | ICD-10-CM

## 2018-02-22 DIAGNOSIS — F322 Major depressive disorder, single episode, severe without psychotic features: Secondary | ICD-10-CM | POA: Diagnosis present

## 2018-02-22 DIAGNOSIS — F1910 Other psychoactive substance abuse, uncomplicated: Secondary | ICD-10-CM

## 2018-02-22 DIAGNOSIS — F419 Anxiety disorder, unspecified: Secondary | ICD-10-CM | POA: Diagnosis present

## 2018-02-22 DIAGNOSIS — F1420 Cocaine dependence, uncomplicated: Secondary | ICD-10-CM | POA: Diagnosis present

## 2018-02-22 DIAGNOSIS — Z79899 Other long term (current) drug therapy: Secondary | ICD-10-CM | POA: Insufficient documentation

## 2018-02-22 DIAGNOSIS — F329 Major depressive disorder, single episode, unspecified: Secondary | ICD-10-CM | POA: Insufficient documentation

## 2018-02-22 LAB — COMPREHENSIVE METABOLIC PANEL
ALT: 17 U/L (ref 0–44)
AST: 27 U/L (ref 15–41)
Albumin: 4.1 g/dL (ref 3.5–5.0)
Alkaline Phosphatase: 74 U/L (ref 38–126)
Anion gap: 7 (ref 5–15)
BUN: 14 mg/dL (ref 6–20)
CO2: 26 mmol/L (ref 22–32)
Calcium: 9.1 mg/dL (ref 8.9–10.3)
Chloride: 103 mmol/L (ref 98–111)
Creatinine, Ser: 0.68 mg/dL (ref 0.44–1.00)
GFR calc non Af Amer: >60 mL/min (ref >60)
Glucose, Bld: 96 mg/dL (ref 70–99)
Potassium: 3.1 mmol/L — ABNORMAL LOW (ref 3.5–5.1)
SODIUM: 136 mmol/L (ref 135–145)
Total Bilirubin: 1.3 mg/dL — ABNORMAL HIGH (ref 0.3–1.2)
Total Protein: 7.5 g/dL (ref 6.5–8.1)

## 2018-02-22 LAB — URINE DRUG SCREEN, QUALITATIVE (ARMC ONLY)
Amphetamines, Ur Screen: NOT DETECTED
Barbiturates, Ur Screen: NOT DETECTED
Benzodiazepine, Ur Scrn: NOT DETECTED
Cannabinoid 50 Ng, Ur ~~LOC~~: NOT DETECTED
Cocaine Metabolite,Ur ~~LOC~~: POSITIVE — AB
MDMA (Ecstasy)Ur Screen: NOT DETECTED
Methadone Scn, Ur: NOT DETECTED
Opiate, Ur Screen: NOT DETECTED
Phencyclidine (PCP) Ur S: NOT DETECTED
TRICYCLIC, UR SCREEN: NOT DETECTED

## 2018-02-22 LAB — ETHANOL: Alcohol, Ethyl (B): <10 mg/dL (ref <10)

## 2018-02-22 LAB — CBC
HCT: 39.3 % (ref 36.0–46.0)
Hemoglobin: 12.8 g/dL (ref 12.0–15.0)
MCH: 27.7 pg (ref 26.0–34.0)
MCHC: 32.6 g/dL (ref 30.0–36.0)
MCV: 85.1 fL (ref 80.0–100.0)
Platelets: 367 10*3/uL (ref 150–400)
RBC: 4.62 MIL/uL (ref 3.87–5.11)
RDW: 14.2 % (ref 11.5–15.5)
WBC: 6.8 10*3/uL (ref 4.0–10.5)
nRBC: 0 % (ref 0.0–0.2)

## 2018-02-22 LAB — POCT PREGNANCY, URINE: Preg Test, Ur: NEGATIVE

## 2018-02-22 LAB — ACETAMINOPHEN LEVEL: Acetaminophen (Tylenol), Serum: <10 ug/mL — ABNORMAL LOW (ref 10–30)

## 2018-02-22 LAB — SALICYLATE LEVEL

## 2018-02-22 MED ORDER — LORAZEPAM 1 MG PO TABS: 1.0000 mg | ORAL_TABLET | ORAL | Status: DC | PRN

## 2018-02-22 MED ORDER — TRAZODONE HCL 100 MG PO TABS
100.0000 mg | ORAL_TABLET | Freq: Every evening | ORAL | Status: DC | PRN
  Filled 2018-02-22: qty 1

## 2018-02-22 MED ORDER — MIRTAZAPINE 15 MG PO TABS
15.0000 mg | ORAL_TABLET | Freq: Every day | ORAL | Status: DC
  Filled 2018-02-22 (×2): qty 1

## 2018-02-22 MED ORDER — MAGNESIUM HYDROXIDE 400 MG/5ML PO SUSP: 30.0000 mL | Freq: Every day | ORAL | Status: DC | PRN

## 2018-02-22 MED ORDER — ACETAMINOPHEN 325 MG PO TABS: 650.0000 mg | ORAL_TABLET | Freq: Four times a day (QID) | ORAL | Status: DC | PRN

## 2018-02-22 MED ORDER — HYDROXYZINE HCL 50 MG PO TABS: 50.0000 mg | ORAL_TABLET | Freq: Three times a day (TID) | ORAL | Status: DC | PRN

## 2018-02-22 MED ORDER — ALUM & MAG HYDROXIDE-SIMETH 200-200-20 MG/5ML PO SUSP: 30.0000 mL | ORAL | Status: DC | PRN

## 2018-02-22 NOTE — ED Notes (Signed)
Report called to Trinna Post, RN in BMU.  Preparing patient for transfer.

## 2018-02-22 NOTE — Tx Team (Signed)
Initial Treatment Plan 02/22/2018 9:06 PM Cassidy OreElena Knape ZOX:[id number]    PATIENT STRESSORS: Financial difficulties Medication change or noncompliance Substance abuse   PATIENT STRENGTHS: Average or above average intelligence Capable of independent living General fund of knowledge Physical Health   PATIENT IDENTIFIED PROBLEMS: Cocaine use/ marijuana adict    Low self esteem     homelessness    Unemployed          DISCHARGE CRITERIA:  Adequate post-discharge living arrangements Improved stabilization in mood, thinking, and/or behavior Medical problems require only outpatient monitoring Motivation to continue treatment in a less acute level of care  PRELIMINARY DISCHARGE PLAN: Attend PHP/IOP Attend 12-step recovery group Outpatient therapy Participate in family therapy Placement in alternative living arrangements  PATIENT/FAMILY INVOLVEMENT: This treatment plan has been presented to and reviewed with the patient, Cassidy Underwood,   The patient  have been given the opportunity to ask questions and make suggestions.  Lelan PonsAlexander  Delcie Ruppert, RN 02/22/2018, 9:06 PM

## 2018-02-22 NOTE — ED Notes (Signed)
Pt's mother came to visit. Pt calm during visit, tearful when her mom left. No behavioral issues.   Maintained on 15 minute checks and observation by security camera for safety.

## 2018-02-22 NOTE — Consult Note (Signed)
  Patient meets criteria for psychiatric admission. Orders entered. case discussed with TTS.

## 2018-02-22 NOTE — ED Notes (Signed)

## 2018-02-22 NOTE — ED Notes (Signed)
IVC/ Consult completed/ Pending Admit to BMU
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
## 2018-02-22 NOTE — ED Notes (Signed)
Patient resting quietly in room. No noted distress or abnormal behaviors noted. Will continue 15 minute checks and observation by security camera for safety. 
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
## 2018-02-22 NOTE — ED Triage Notes (Addendum)
Pt is here with a support friend pt states that she needs help with her drug addiction, states that she has been very suicidal and states that last night she placed a string around her neck, pt states that she has never tried to get help before, states that this is her only hope, that she knows that if it doesn't help she isn't going to live   [place] long sleeved shirt and gray pants removed, a pair of white high top sneakers, one pair of gray socks and one hair tie. No jewelry, no purse, no cell phone with pt  Pt reports giving her ID to her friend

## 2018-02-22 NOTE — BH Assessment (Signed)
Patient is to be admitted to [facility] by Dr. Jennet Maduro.  Attending Physician will be Dr. Toni Amend.   Patient has been assigned to room 323, by [facility] Charge Nurse T'Yawn.   ER staff is aware of the admission:  Glenda, ER Secretary    Dr. Roxan Hockey, ER MD   Amy B., Patient's Nurse   Judi Saa, Patient Access.

## 2018-02-22 NOTE — ED Notes (Signed)
Pt walking quickly put into the hallway stating  "I am ready to go - you said I would have three hours before I am committed so I want to leave before you commit me"  Explained to her that the MD has already IVC'd her and that the only person that can reverse the paperwork is the psychiatrist if they deem she is safe to leave  She then proceeds to states  "You are a liar - Wow I have never had a nurse lie to me before."  Reassured her that I have only told her the truth  Pt requesting another nurse to care for her  Pt to transfer to [facility]
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
## 2018-02-23 DIAGNOSIS — F1994 Other psychoactive substance use, unspecified with psychoactive substance-induced mood disorder: Secondary | ICD-10-CM

## 2018-02-23 DIAGNOSIS — F1420 Cocaine dependence, uncomplicated: Secondary | ICD-10-CM

## 2018-02-23 MED ORDER — POTASSIUM CHLORIDE 20 MEQ PO PACK
20.0000 meq | PACK | Freq: Two times a day (BID) | ORAL | Status: DC
  Administered 2018-02-23 – 2018-02-26 (×6): 20 meq via ORAL
  Filled 2018-02-23 (×8): qty 1

## 2018-02-23 NOTE — [facility] Group Notes (Signed)
LCSW Group Therapy Note  02/23/2018 1:15pm  Type of Therapy and Topic:  Group Therapy:  Fears and Unhealthy Coping Skills  Participation Level:  Active   Description of Group:  The focus of this group was to discuss some of the prevalent fears that patients experience, and to identify the commonalities among group members.  An exercise was used to initiate the discussion, followed by writing on the white board a group-generated list of unhealthy coping and healthy coping techniques to deal with each fear.    Therapeutic Goals: 1. Patient will identify and describe 3 fears they experience 2. Patient will identify one positive coping strategy for each fear they experience 3. Patient will respond empathically to peers statements regarding fears they experience  Summary of Patient Progress:  The patient reported she feels "tired." Pt. discussed some of her prevalent fears that she has experienced and was able to identify the commonalities among group members.  Pt shared shared with each other unhealthy copings and healthy coping techniques to deal with their fears. Pt. was actively and appropriately engaged in the group. Patient was able to provide support and validation to other group members.    Therapeutic Modalities Cognitive Behavioral Therapy Motivational Interviewing  Johnnye Sima, LCSW 02/23/2018 12:24 PM

## 2018-02-23 NOTE — Plan of Care (Signed)
Pt. Interactions appropriate with staff and peers. Pt. Reports improved coping, but wants to be discharged, so she can go to an, "actual rehab facility". Pt. Verbalizes feelings appropriate.    Problem: Coping: Goal: Coping ability will improve Outcome: Progressing   Problem: Coping: Goal: Ability to interact with others will improve Outcome: Progressing   Problem: Self-Concept: Goal: Will verbalize positive feelings about self Outcome: Progressing

## 2018-02-23 NOTE — Progress Notes (Signed)
D: Pt denies SI/HI/AVH, contracts for safety verbally. Pt is pleasant and cooperative, engages appropriately. Pt. Reports some discomfort in her wrist that is causing her same pain, but denies need for PRN medication for pain. Patient Interactions are appropriate. Pt. Expresses that she does not want to be here, but really wants to be at a drug rehab facility. Pt. Expresses some regret regarding her reason for being admitted. Pt. Expresses motivation to get clean of drugs and do better. No behavioral concerns to report.   A: Q x 15 minute observation checks were completed for safety. Patient was provided with education, but needs reinforcement. Patient was given/offered medications per orders. Patient  was encourage to attend groups, participate in unit activities and continue with plan of care. Pt. Chart and plans of care reviewed. Pt. Given support and encouragement.   R: Patient is complaint with unit procedures. Pt. Attends all meals, eating good.             Precautionary checks every 15 minutes for safety maintained, room free of safety hazards, patient sustains no injury or falls during this shift. Will endorse care to next shift.
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
## 2018-02-23 NOTE — Progress Notes (Signed)
This is the second night the patient is refusing to take her Remeron 15 mg p.o stating that the doctors told her she don't have other pills to take noted.
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
## 2018-02-24 NOTE — [facility] Suicide Risk Assessment (Signed)
[facility] INPATIENT:  Family/Significant Other Suicide Prevention Education  Suicide Prevention Education:  Patient Refusal for Family/Significant Other Suicide Prevention Education: The patient Cassidy Underwood has refused to provide written consent for family/significant other to be provided Family/Significant Other Suicide Prevention Education during admission and/or prior to discharge.  Physician notified.  Chanya Chrisley  CUEBAS-COLON 02/24/2018, 3:30 PM
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
## 2018-02-24 NOTE — Plan of Care (Signed)
Working on Engineer, maintenance . Able to identify that a need for Rehab is needed . Compliant  with  medication. Interacting  with peers . Attending unit programing . Good eye contact .  Understanding factors  that lead to anxiety . Denies suicidal ideations    Problem: Coping: Goal: Coping ability will improve Outcome: Progressing   Problem: Health Behavior/Discharge Planning: Goal: Identification of resources available to assist in meeting health care needs will improve Outcome: Progressing   Problem: Medication: Goal: Compliance with prescribed medication regimen will improve Outcome: Progressing   Problem: Coping: Goal: Ability to identify and develop effective coping behavior will improve Outcome: Progressing Goal: Ability to interact with others will improve Outcome: Progressing Goal: Demonstration of participation in decision-making regarding own care will improve Outcome: Progressing Goal: Ability to use eye contact when communicating with others will improve Outcome: Progressing   Problem: Self-Concept: Goal: Will verbalize positive feelings about self Outcome: Progressing   Problem: Activity: Goal: Interest or engagement in leisure activities will improve Outcome: Progressing Goal: Imbalance in normal sleep/wake cycle will improve Outcome: Progressing   Problem: Health Behavior/Discharge Planning: Goal: Ability to make decisions will improve Outcome: Progressing Goal: Compliance with therapeutic regimen will improve Outcome: Progressing   Problem: Education: Goal: Ability to state activities that reduce stress will improve Outcome: Progressing   Problem: Self-Concept: Goal: Ability to identify factors that promote anxiety will improve Outcome: Progressing Goal: Level of anxiety will decrease Outcome: Progressing Goal: Ability to modify response to factors that promote anxiety will improve Outcome: Progressing   Problem:  Self-Concept: Goal: Ability to disclose and discuss suicidal ideas will improve Outcome: Progressing

## 2018-02-24 NOTE — Progress Notes (Signed)
Patient is adjusting well in the unit, socializing well with peers , patient spoke well only thought that this is a drug rehab center, redirect patient to understand that this is a behavioral health drug and alcohol detox  center . Showing generalized anxiety disorder and feeling of increased muscular tension across her neck and shoulders, patient contract for safety  And denies SI/HI/AVH, sleep is continuous and monitored every 15 minutes for safety  No distress.
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
## 2018-02-24 NOTE — Plan of Care (Signed)
  Problem: Coping: Goal: Coping ability will improve Outcome: Progressing   Problem: Health Behavior/Discharge Planning: Goal: Identification of resources available to assist in meeting health care needs will improve Outcome: Progressing   Problem: Medication: Goal: Compliance with prescribed medication regimen will improve Outcome: Progressing   Problem: Coping: Goal: Ability to identify and develop effective coping behavior will improve Outcome: Progressing Goal: Ability to interact with others will improve Outcome: Progressing Goal: Demonstration of participation in decision-making regarding own care will improve Outcome: Progressing Goal: Ability to use eye contact when communicating with others will improve Outcome: Progressing   Problem: Self-Concept: Goal: Will verbalize positive feelings about self Outcome: Progressing   Problem: Activity: Goal: Interest or engagement in leisure activities will improve Outcome: Progressing Goal: Imbalance in normal sleep/wake cycle will improve Outcome: Progressing   Problem: Health Behavior/Discharge Planning: Goal: Ability to make decisions will improve Outcome: Progressing Goal: Compliance with therapeutic regimen will improve Outcome: Progressing   Problem: Education: Goal: Ability to state activities that reduce stress will improve Outcome: Progressing   Problem: Self-Concept: Goal: Ability to identify factors that promote anxiety will improve Outcome: Progressing Goal: Level of anxiety will decrease Outcome: Progressing Goal: Ability to modify response to factors that promote anxiety will improve Outcome: Progressing   Problem: Self-Concept: Goal: Ability to disclose and discuss suicidal ideas will improve Outcome: Progressing

## 2018-02-24 NOTE — BHH Group Notes (Signed)
LCSW Group Therapy Note 02/24/2018 1:15pm  Type of Therapy and Topic: Group Therapy: Feelings Around Returning Home & Establishing a Supportive Framework and Supporting Oneself When Supports Not Available  Participation Level: Active  Description of Group:  Patients first processed thoughts and feelings about upcoming discharge. These included fears of upcoming changes, lack of change, new living environments, judgements and expectations from others and overall stigma of mental health issues. The group then discussed the definition of a supportive framework, what that looks and feels like, and how do to discern it from an unhealthy non-supportive network. The group identified different types of supports as well as what to do when your family/friends are less than helpful or unavailable  Therapeutic Goals  1. Patient will identify one healthy supportive network that they can use at discharge. 2. Patient will identify one factor of a supportive framework and how to tell it from an unhealthy network. 3. Patient able to identify one coping skill to use when they do not have positive supports from others. 4. Patient will demonstrate ability to communicate their needs through discussion and/or role plays.  Summary of Patient Progress:  The patient reports she feels "tired." Pt engaged during group session. As patients processed their anxiety about discharge and described healthy supports patient shared she is ready to be discharge. She stated "I am not being challenged here."  Patients identified at least one self-care tool they were willing to use after discharge.   Therapeutic Modalities Cognitive Behavioral Therapy Motivational Interviewing   Eleftherios Dudenhoeffer  CUEBAS-COLON, LCSW 02/24/2018 9:59 AM

## 2018-02-25 LAB — BASIC METABOLIC PANEL
Anion gap: 6 (ref 5–15)
BUN: 12 mg/dL (ref 6–20)
CO2: 24 mmol/L (ref 22–32)
Calcium: 8.7 mg/dL — ABNORMAL LOW (ref 8.9–10.3)
Chloride: 107 mmol/L (ref 98–111)
Creatinine, Ser: 0.58 mg/dL (ref 0.44–1.00)
GFR calc Af Amer: >60 mL/min (ref >60)
GFR calc non Af Amer: >60 mL/min (ref >60)
Glucose, Bld: 101 mg/dL — ABNORMAL HIGH (ref 70–99)
Potassium: 4.1 mmol/L (ref 3.5–5.1)
Sodium: 137 mmol/L (ref 135–145)

## 2018-02-25 NOTE — Plan of Care (Signed)
Able to identify that  Rehab is need . Compliant  with  medication. Interacting  with peers . Attending unit programing . Good eye contact .  Understanding factors  that lead to anxiety . Denies suicidal ideations Working on Materials engineer and decision making skills .   Problem: Coping: Goal: Coping ability will improve Outcome: Progressing   Problem: Health Behavior/Discharge Planning: Goal: Identification of resources available to assist in meeting health care needs will improve Outcome: Progressing   Problem: Medication: Goal: Compliance with prescribed medication regimen will improve Outcome: Progressing   Problem: Coping: Goal: Ability to identify and develop effective coping behavior will improve Outcome: Progressing Goal: Ability to interact with others will improve Outcome: Progressing Goal: Demonstration of participation in decision-making regarding own care will improve Outcome: Progressing Goal: Ability to use eye contact when communicating with others will improve Outcome: Progressing   Problem: Self-Concept: Goal: Will verbalize positive feelings about self Outcome: Progressing   Problem: Activity: Goal: Interest or engagement in leisure activities will improve Outcome: Progressing Goal: Imbalance in normal sleep/wake cycle will improve Outcome: Progressing   Problem: Health Behavior/Discharge Planning: Goal: Ability to make decisions will improve Outcome: Progressing Goal: Compliance with therapeutic regimen will improve Outcome: Progressing   Problem: Education: Goal: Ability to state activities that reduce stress will improve Outcome: Progressing   Problem: Self-Concept: Goal: Ability to identify factors that promote anxiety will improve Outcome: Progressing Goal: Level of anxiety will decrease Outcome: Progressing Goal: Ability to modify response to factors that promote anxiety will improve Outcome: Progressing   Problem: Self-Concept: Goal:  Ability to disclose and discuss suicidal ideas will improve Outcome: Progressing

## 2018-02-25 NOTE — Tx Team (Signed)
Interdisciplinary Treatment and Diagnostic Plan Update  02/25/2018 Time of Session: 230pm [facility] [id number]  Principal Diagnosis: <principal problem not specified>  Secondary Diagnoses: Active Problems:   Cocaine use disorder, severe, dependence (HCC)   Major depressive disorder, single episode, severe without psychosis (HCC)   Substance induced mood disorder (HCC)   Current Medications:  Current Facility-Administered Medications  Medication Dose Route Frequency Provider Last Rate Last Dose  . acetaminophen (TYLENOL) tablet 650 mg  650 mg Oral Q6H PRN Pucilowska, Jolanta B, MD      . alum & mag hydroxide-simeth (MAALOX/MYLANTA) 200-200-20 MG/5ML suspension 30 mL  30 mL Oral Q4H PRN Pucilowska, Jolanta B, MD      . hydrOXYzine (ATARAX/VISTARIL) tablet 50 mg  50 mg Oral TID PRN Pucilowska, Jolanta B, MD      . magnesium hydroxide (MILK OF MAGNESIA) suspension 30 mL  30 mL Oral Daily PRN Pucilowska, Jolanta B, MD      . potassium chloride (KLOR-CON) packet 20 mEq  20 mEq Oral BID Beverly SessionsSubedi, Jagannath, MD   20 mEq at 02/25/18 0758  . traZODone (DESYREL) tablet 100 mg  100 mg Oral QHS PRN Pucilowska, Jolanta B, MD       PTA Medications: Medications Prior to Admission  Medication Sig Dispense Refill Last Dose  . ibuprofen (ADVIL,MOTRIN) 100 MG/5ML suspension Take 30 mLs (600 mg total) by mouth every 6 (six) hours. 30 mL 0     Patient Stressors: Financial difficulties Medication change or noncompliance Substance abuse  Patient Strengths: Average or above average intelligence Capable of independent living General fund of knowledge Physical Health  Treatment Modalities: Medication Management, Group therapy, Case management,  1 to 1 session with clinician, Psychoeducation, Recreational therapy.   Physician Treatment Plan for Primary Diagnosis: <principal problem not specified> Long Term Goal(s): Improvement in symptoms so as ready for discharge Improvement in symptoms  so as ready for discharge   Short Term Goals: Ability to identify changes in lifestyle to reduce recurrence of condition will improve Ability to verbalize feelings will improve Ability to disclose and discuss suicidal ideas Ability to demonstrate self-control will improve Ability to identify and develop effective coping behaviors will improve Ability to maintain clinical measurements within normal limits will improve Compliance with prescribed medications will improve Ability to identify triggers associated with substance abuse/mental health issues will improve Ability to identify changes in lifestyle to reduce recurrence of condition will improve Ability to verbalize feelings will improve Ability to disclose and discuss suicidal ideas Ability to demonstrate self-control will improve Ability to identify and develop effective coping behaviors will improve Ability to maintain clinical measurements within normal limits will improve Compliance with prescribed medications will improve Ability to identify triggers associated with substance abuse/mental health issues will improve  Medication Management: Evaluate patient's response, side effects, and tolerance of medication regimen.  Therapeutic Interventions: 1 to 1 sessions, Unit Group sessions and Medication administration.  Evaluation of Outcomes: Progressing  Physician Treatment Plan for Secondary Diagnosis: Active Problems:   Cocaine use disorder, severe, dependence (HCC)   Major depressive disorder, single episode, severe without psychosis (HCC)   Substance induced mood disorder (HCC)  Long Term Goal(s): Improvement in symptoms so as ready for discharge Improvement in symptoms so as ready for discharge   Short Term Goals: Ability to identify changes in lifestyle to reduce recurrence of condition will improve Ability to verbalize feelings will improve Ability to disclose and discuss suicidal ideas Ability to demonstrate self-control  will improve Ability to  identify and develop effective coping behaviors will improve Ability to maintain clinical measurements within normal limits will improve Compliance with prescribed medications will improve Ability to identify triggers associated with substance abuse/mental health issues will improve Ability to identify changes in lifestyle to reduce recurrence of condition will improve Ability to verbalize feelings will improve Ability to disclose and discuss suicidal ideas Ability to demonstrate self-control will improve Ability to identify and develop effective coping behaviors will improve Ability to maintain clinical measurements within normal limits will improve Compliance with prescribed medications will improve Ability to identify triggers associated with substance abuse/mental health issues will improve     Medication Management: Evaluate patient's response, side effects, and tolerance of medication regimen.  Therapeutic Interventions: 1 to 1 sessions, Unit Group sessions and Medication administration.  Evaluation of Outcomes: Progressing   RN Treatment Plan for Primary Diagnosis: <principal problem not specified> Long Term Goal(s): Knowledge of disease and therapeutic regimen to maintain health will improve  Short Term Goals: Ability to participate in decision making will improve, Ability to disclose and discuss suicidal ideas, Ability to identify and develop effective coping behaviors will improve and Compliance with prescribed medications will improve  Medication Management: RN will administer medications as ordered by provider, will assess and evaluate patient's response and provide education to patient for prescribed medication. RN will report any adverse and/or side effects to prescribing provider.  Therapeutic Interventions: 1 on 1 counseling sessions, Psychoeducation, Medication administration, Evaluate responses to treatment, Monitor vital signs and CBGs as  ordered, Perform/monitor CIWA, COWS, AIMS and Fall Risk screenings as ordered, Perform wound care treatments as ordered.  Evaluation of Outcomes: Progressing   LCSW Treatment Plan for Primary Diagnosis: <principal problem not specified> Long Term Goal(s): Safe transition to appropriate next level of care at discharge, Engage patient in therapeutic group addressing interpersonal concerns.  Short Term Goals: Engage patient in aftercare planning with referrals and resources  Therapeutic Interventions: Assess for all discharge needs, 1 to 1 time with Social worker, Explore available resources and support systems, Assess for adequacy in community support network, Educate family and significant other(s) on suicide prevention, Complete Psychosocial Assessment, Interpersonal group therapy.  Evaluation of Outcomes: Progressing   Progress in Treatment: Attending groups: Yes. Participating in groups: Yes. Taking medication as prescribed: Yes. Toleration medication: Yes. Family/Significant other contact made: No, will contact:  pt declined Patient understands diagnosis: Yes. Discussing patient identified problems/goals with staff: Yes. Medical problems stabilized or resolved: Yes. Denies suicidal/homicidal ideation: Yes. Issues/concerns per patient self-inventory: No. Other: NA  New problem(s) identified: No, Describe:  None reported  New Short Term/Long Term Goal(s): "I have a bad crack cocaine addiction and I want to get off drugs"  Patient Goals:  "I have a bad crack cocaine addiction and I want to get off drugs"  Discharge Plan or Barriers: At this time, pt says she does not want to be referred to a residential tx program or outpatient tx. Also, pt says she does not want to be referred to a shelter at this time. Pt states she has a church friend who has completed referral for a rehab program but she is unable to recall the name.  CSW will provide pt with list of residential and SAIOP  resources that she can contact when she is ready.  Reason for Continuation of Hospitalization: Medication stabilization  Estimated Length of Stay: Potential d/c on 02/26/18  Attendees: Patient:[facility] 02/25/2018 3:53 PM  Physician: Mordecai Rasmussen MD 02/25/2018 3:53 PM  Nursing:  02/25/2018 3:53 PM  RN Care Manager: 02/25/2018 3:53 PM  Social Worker: Lowella Dandyarren Kataleyah Carducci LCSW Penni HomansMichaela Stanfield LCSW 02/25/2018 3:53 PM  Recreational Therapist: Garret ReddishShay Outlaw LRT 02/25/2018 3:53 PM  Other:  02/25/2018 3:53 PM  Other:  02/25/2018 3:53 PM  Other: 02/25/2018 3:53 PM    Scribe for Treatment Team: Suzan SlickARREN T Bert Ptacek, LCSW 02/25/2018 3:53 PM

## 2018-02-25 NOTE — Progress Notes (Signed)
D: Pt denies SI/HI/AVH, can contract for safety. Pt is pleasant and cooperative, engages appropriately. Pt. Affect appropriate, but appears a little anxious at times. Pt. Continues to express she is looking for long term drug rehab to help get her life back together. Pt. Reports a mostly normal mood with some anxiety. No behavioral concerns to report.   A: Q x 15 minute observation checks were completed for safety. Patient was provided with education, needs reinforcement. Patient was given/offered medications per orders. Patient  was encourage to attend groups, participate in unit activities and continue with plan of care. Pt. Chart and plans of care reviewed. Pt. Given support and encouragement.   R: Patient is complaint with medication and unit procedures. Pt. Attends snack time and group. Pt. Eating good.             Precautionary checks every 15 minutes for safety maintained, room free of safety hazards, patient sustains no injury or falls during this shift. Will endorse care to next shift.

## 2018-02-25 NOTE — [facility] Group Notes (Signed)
[facility] Group Notes:  (Nursing/MHT/Case Management/Adjunct)  Date:  02/25/2018  Time:  10:08 PM  Type of Therapy:  Group Therapy  Participation Level:  Active  Participation Quality:  Appropriate  Affect:  Appropriate  Cognitive:  Alert  Insight:  Good  Engagement in Group:  Engaged  Modes of Intervention:  Support  Summary of Progress/Problems:  Cassidy Underwood 02/25/2018, 10:08 PM
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
## 2018-02-25 NOTE — Plan of Care (Signed)
Pt. Reports improved coping. Pt. Complaint with medications and unit procedures. Pt. Interactions are appropriate. Pt. Attends snack time and group. Pt. Denies si/hi/avh, can contract for safety.    Problem: Coping: Goal: Coping ability will improve Outcome: Progressing   Problem: Medication: Goal: Compliance with prescribed medication regimen will improve Outcome: Progressing   Problem: Coping: Goal: Ability to interact with others will improve Outcome: Progressing   Problem: Activity: Goal: Interest or engagement in leisure activities will improve Outcome: Progressing   Problem: Self-Concept: Goal: Ability to disclose and discuss suicidal ideas will improve Outcome: Progressing

## 2018-02-25 NOTE — Progress Notes (Signed)
Recreation Therapy Notes  Date: 02/25/2018  Time: 9:30 am  Location: Craft Room  Behavioral response: Appropriate  Intervention Topic: Emotions  Discussion/Intervention:  Group content on today was focused on emotions. The group identified what emotions are and why it is important to have emotions. Patients expressed some positive and negative emotions. Individuals gave some past experiences on how they normally dealt with emotions in the past. The group described some positive ways to deal with emotions in the future. Patients participated in the intervention "Name the [name]" where individuals were given a chance to experience different emotions.  Clinical Observations/Feedback:  Patient came to group late due to unknown reasons. Individual was social with peers and staff while participating in the intervention. [name] LRT/CTRS         Cassidy Underwood 02/25/2018 10:55 AM

## 2018-02-25 NOTE — [facility] Counselor (Signed)
CSW met with pt to discuss after care plan when she discharges from hospital.. Pt admits to using cocaine but says she does not want to go to residential treatment at this time. Pt says she lives in a tent and does not want to be referred to a shelter at this time. Pt says she is open to receiving information on SAIOP but does not want to be referred to a provider at this time.

## 2018-02-25 NOTE — [facility] Group Notes (Signed)
LCSW Group Therapy Note   02/25/2018 1:00 PM  Type of Therapy and Topic:  Group Therapy:  Overcoming Obstacles   Participation Level:  Active   Description of Group:    In this group patients will be encouraged to explore what they see as obstacles to their own wellness and recovery. They will be guided to discuss their thoughts, feelings, and behaviors related to these obstacles. The group will process together ways to cope with barriers, with attention given to specific choices patients can make. Each patient will be challenged to identify changes they are motivated to make in order to overcome their obstacles. This group will be process-oriented, with patients participating in exploration of their own experiences as well as giving and receiving support and challenge from other group members.   Therapeutic Goals: 1. Patient will identify personal and current obstacles as they relate to admission. 2. Patient will identify barriers that currently interfere with their wellness or overcoming obstacles.  3. Patient will identify feelings, thought process and behaviors related to these barriers. 4. Patient will identify two changes they are willing to make to overcome these obstacles:      Summary of Patient Progress Patient was active participant in group.  Patient identified that her biggest obstacles presently are substance use and choosing negative peers.  Patient reports that she struggles with peer pressure and being able to tell others "no" in addition to trust issues.  Patient reports that she feels this is a bigger concern than her substance use at this time. Patient also identified her anger as a barrier.  Patient was able to identify coping methods to address anger but continued to struggle with identifying coping methods for negative peer influence and substance use.     Therapeutic Modalities:   Cognitive Behavioral Therapy Solution Focused Therapy Motivational Interviewing Relapse  Prevention Therapy  Penni Homans, MSW, LCSW 02/25/2018 4:00 PM

## 2018-02-26 NOTE — [facility] Suicide Risk Assessment (Signed)
[facility]   Principal Problem: <principal problem not specified> Discharge Diagnoses: Active Problems:   Cocaine use disorder, severe, dependence (HCC)   Major depressive disorder, single episode, severe without psychosis (HCC)   Substance induced mood disorder (HCC)   Total Time spent with patient: 45 minutes  Musculoskeletal: Strength & Muscle Tone: within normal limits Gait & Station: normal Patient leans: N/A  Psychiatric Specialty Exam: Review of Systems  Constitutional: Negative.   HENT: Negative.   Eyes: Negative.   Respiratory: Negative.   Cardiovascular: Negative.   Gastrointestinal: Negative.   Musculoskeletal: Negative.   Skin: Negative.   Neurological: Negative.   Psychiatric/Behavioral: Negative.     Blood pressure 104/65, pulse 71, temperature 97.9 F (36.6 C), temperature source Oral, resp. rate 17, height 4\' 10"  (1.473 m), weight 44.5 kg, SpO2 100 %, unknown if currently breastfeeding.Body mass index is 20.48 kg/m.  General Appearance: Casual  Eye Contact::  Good  Speech:  Clear and Coherent409  Volume:  Normal  Mood:  Euthymic  Affect:  Congruent  Thought Process:  Coherent  Orientation:  Full (Time, Place, and Person)  Thought Content:  Logical  Suicidal Thoughts:  No  Homicidal Thoughts:  No  Memory:  Immediate;   Fair Recent;   Fair Remote;   Fair  Judgement:  Fair  Insight:  Fair  Psychomotor Activity:  Decreased  Concentration:  Good  Recall:  Good  Fund of Knowledge:Good  Language: Good  Akathisia:  No  Handed:  Right  AIMS (if indicated):     Assets:  Desire for Improvement Housing Physical Health  Sleep:  Number of Hours: 6  Cognition: WNL  ADL's:  Intact   Mental Status Per Nursing Assessment::   On Admission:  Suicidal ideation indicated by patient  Demographic Factors:  Low socioeconomic status  Loss Factors: Financial problems/change in socioeconomic status  Historical  Factors: Impulsivity  Risk Reduction Factors:   Positive coping skills or problem solving skills  Continued Clinical Symptoms:  Alcohol/Substance Abuse/Dependencies  Cognitive Features That Contribute To Risk:  Loss of executive function    Suicide Risk:  Minimal: No identifiable suicidal ideation.  Patients presenting with no risk factors but with morbid ruminations; may be classified as minimal risk based on the severity of the depressive symptoms    Plan Of Care/Follow-up recommendations:  Activity:  Activity as tolerated Diet:  Regular diet Other:  Follow-up with local mental health treatment as directed  Mordecai RasmussenJohn Karlisa Gaubert, MD 02/26/2018, 9:50 AM

## 2018-02-26 NOTE — Progress Notes (Signed)
D: Pt denies SI/HI/AVH, can contract for safety. Pt is pleasant and cooperative. Pt. has no Complaints, just wants to discharge. Patient Interactions appropriate. Mood normal. No behavioral concerns to report.   A: Q x 15 minute observation checks were completed for safety. Patient was provided with education. Patient was given/offered medications per orders. Patient  was encourage to attend groups, participate in unit activities and continue with plan of care. Pt. Chart and plans of care reviewed. Pt. Given support and encouragement.   R: Patient is complaint with medication and unit procedures. Pt. Attends group and snack time, eating good.             Precautionary checks every 15 minutes for safety maintained, room free of safety hazards, patient sustains no injury or falls during this shift. Will endorse care to next shift.

## 2018-02-26 NOTE — Progress Notes (Signed)
Recreation Therapy Notes   Date: 02/26/2018  Time: 9:30 am  Location: Craft Room  Behavioral response: Appropriate  Intervention Topic: Goals  Discussion/Intervention:  Group content on today was focused on goals. Patients described what goals are and how they define goals. Individuals expressed how they go about setting goals and reaching them. The group identified how important goals are and if they make short term goals to reach long term goals. Patients described how many goals they work on at a time and what affects them not reaching their goal. Individuals described how much time they put into planning and obtaining their goals. The group participated in the intervention "My Goal Board" and made personal goal boards to help them achieve their goal. Clinical Observations/Feedback:  Patient came to group and stated goals are relatable, attainable and realistic. She expressed that she makes goals to get to the big picture. Participant explained that she make steps to accomplish a goal. Individual was social with peers and staff while participating in the intervention. Cassidy Underwood LRT/CTRS         Lorris Carducci 02/26/2018 11:45 AM

## 2018-02-26 NOTE — Progress Notes (Signed)
  [facility] Adult Case Management Discharge Plan :  Will you be returning to the same living situation after discharge:  Yes,  pt is homeless, does not want to be referred to shelter at this time. At discharge, do you have transportation home?: Yes,  Arline Asp, church friend will pick up at discharge. Do you have the ability to pay for your medications: Pt says the only medication she takes is potassium, no psychotropic medications taken during admission.  Release of information consent forms completed and in the chart;  Patient's signature needed at discharge.  Patient to Follow up at: Pt provided contact information to the following residential treatment centers: [facility], [facility], [facility], [facility], [facility]. Pt provided with information on the following shelters: [employer], [facility], [facility], [employer], [employer]. Pt does not want residential treatment, outpatient treatment or shelter at this time.   Next level of care provider has access to [facility] Link:no  Safety Planning and Suicide Prevention discussed: Yes,  with pt, pt declined family contact.  Have you used any form of tobacco in the last 30 days? (Cigarettes, Smokeless Tobacco, Cigars, and/or Pipes): Yes  Has patient been referred to the Quitline?: Physician counseled pt on smoking cessation  Patient has been referred for addiction treatment: N/A  Suzan Slick, LCSW 02/26/2018, 9:27 AM

## 2018-02-26 NOTE — BHH Counselor (Signed)
CSW met with pt who continues to state she does not want follow up care at this time. CSW provided pt with a list of shelters and residential treatment centers. CSW spoke with physician(Clapacs) and informed him pt continues to state she does not want follow up care at discharge.

## 2018-02-26 NOTE — Progress Notes (Signed)
Patient is alert and oriented X4 denies SI, HI and AVH. Patient received shoes from locker along with discharge instructions. Patient rates pain 0/10 upon discharge. Patient escorted to waiting area where friend awaited to take patient home.
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
## 2018-02-26 NOTE — Plan of Care (Signed)
Pt. Reports improved coping. Pt. Reports normal mood. Pt. Interactions are appropriate. Pt. Denies si/hi/avh, can contract for safety.    Problem: Coping: Goal: Coping ability will improve Outcome: Progressing   Problem: Coping: Goal: Ability to interact with others will improve Outcome: Progressing   Problem: Self-Concept: Goal: Ability to disclose and discuss suicidal ideas will improve Outcome: Progressing

## 2018-10-24 ENCOUNTER — Emergency Department (HOSPITAL_BASED_OUTPATIENT_CLINIC_OR_DEPARTMENT_OTHER)
Admission: EM | Admit: 2018-10-24 | Discharge: 2018-10-24 | Disposition: A | Discharge status: Home / Self Care | Payer: Medicaid Other | Attending: Emergency Medicine | Admitting: Emergency Medicine

## 2018-10-24 ENCOUNTER — Other Ambulatory Visit: Payer: Self-pay

## 2018-10-24 DIAGNOSIS — O9A212 Injury, poisoning and certain other consequences of external causes complicating pregnancy, second trimester: Secondary | ICD-10-CM | POA: Diagnosis present

## 2018-10-24 DIAGNOSIS — W01198A Fall on same level from slipping, tripping and stumbling with subsequent striking against other object, initial encounter: Secondary | ICD-10-CM | POA: Diagnosis not present

## 2018-10-24 DIAGNOSIS — Y929 Unspecified place or not applicable: Secondary | ICD-10-CM | POA: Insufficient documentation

## 2018-10-24 DIAGNOSIS — O9989 Other specified diseases and conditions complicating pregnancy, childbirth and the puerperium: Secondary | ICD-10-CM | POA: Diagnosis not present

## 2018-10-24 DIAGNOSIS — Y939 Activity, unspecified: Secondary | ICD-10-CM | POA: Insufficient documentation

## 2018-10-24 DIAGNOSIS — Z3A24 24 weeks gestation of pregnancy: Secondary | ICD-10-CM | POA: Insufficient documentation

## 2018-10-24 DIAGNOSIS — Y999 Unspecified external cause status: Secondary | ICD-10-CM | POA: Insufficient documentation

## 2018-10-24 DIAGNOSIS — W19XXXA Unspecified fall, initial encounter: Secondary | ICD-10-CM

## 2018-10-24 DIAGNOSIS — L03211 Cellulitis of face: Secondary | ICD-10-CM | POA: Diagnosis not present

## 2018-10-24 MED ORDER — CEPHALEXIN 500 MG PO CAPS: 500.0000 mg | ORAL_CAPSULE | Freq: Three times a day (TID) | ORAL | 0 refills | Status: AC

## 2018-10-24 MED FILL — CEPHALEXIN 500 MG CAPSULE: 500 | 7 days supply | Qty: 21 | Fill #0

## 2018-10-24 NOTE — Discharge Instructions (Addendum)
You were evaluated in the Emergency Department and after careful evaluation, we did not find any emergent condition requiring admission or further testing in the hospital.  Your exam/testing today was overall reassuring.  Please take the antibiotic as directed and follow-up with your OB doctor.  Please return to the Emergency Department if you experience any worsening of your condition.  We encourage you to follow up with a primary care provider.  Thank you for allowing [place] to be a part of your care.

## 2018-10-24 NOTE — ED Triage Notes (Signed)
Pt arrives with multiple complaints. Pt states pregnant, due November 7. Pt states she is homeless, sleeps on the ground, has noticed swelling on face possibly due to coming in contact with something.  Also states "have feeling something isn't right with the baby"  Feeling abdominal pressure. States feels baby movement as usual.
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
## 2018-12-15 ENCOUNTER — Encounter (HOSPITAL_COMMUNITY): Payer: Self-pay

## 2018-12-15 ENCOUNTER — Other Ambulatory Visit: Payer: Self-pay

## 2018-12-15 ENCOUNTER — Inpatient Hospital Stay (HOSPITAL_COMMUNITY)
Admission: AD | Admit: 2018-12-15 | Discharge: 2018-12-17 | DRG: 806 | Disposition: A | Discharge status: Home / Self Care | Payer: Medicaid Other | Attending: Obstetrics & Gynecology | Admitting: Obstetrics & Gynecology

## 2018-12-15 DIAGNOSIS — F322 Major depressive disorder, single episode, severe without psychotic features: Secondary | ICD-10-CM | POA: Diagnosis present

## 2018-12-15 DIAGNOSIS — F1910 Other psychoactive substance abuse, uncomplicated: Secondary | ICD-10-CM | POA: Diagnosis present

## 2018-12-15 DIAGNOSIS — O99320 Drug use complicating pregnancy, unspecified trimester: Secondary | ICD-10-CM | POA: Diagnosis present

## 2018-12-15 DIAGNOSIS — F1420 Cocaine dependence, uncomplicated: Secondary | ICD-10-CM | POA: Diagnosis present

## 2018-12-15 DIAGNOSIS — F1290 Cannabis use, unspecified, uncomplicated: Secondary | ICD-10-CM | POA: Diagnosis present

## 2018-12-15 DIAGNOSIS — O99324 Drug use complicating childbirth: Principal | ICD-10-CM | POA: Diagnosis present

## 2018-12-15 DIAGNOSIS — F1914 Other psychoactive substance abuse with psychoactive substance-induced mood disorder: Secondary | ICD-10-CM | POA: Diagnosis present

## 2018-12-15 DIAGNOSIS — Z3490 Encounter for supervision of normal pregnancy, unspecified, unspecified trimester: Secondary | ICD-10-CM

## 2018-12-15 DIAGNOSIS — Z5900 Homelessness unspecified: Secondary | ICD-10-CM

## 2018-12-15 DIAGNOSIS — Z3A39 39 weeks gestation of pregnancy: Secondary | ICD-10-CM

## 2018-12-15 DIAGNOSIS — O99344 Other mental disorders complicating childbirth: Secondary | ICD-10-CM | POA: Diagnosis present

## 2018-12-15 DIAGNOSIS — F1994 Other psychoactive substance use, unspecified with psychoactive substance-induced mood disorder: Secondary | ICD-10-CM | POA: Diagnosis present

## 2018-12-15 DIAGNOSIS — Z20828 Contact with and (suspected) exposure to other viral communicable diseases: Secondary | ICD-10-CM | POA: Diagnosis present

## 2018-12-15 NOTE — MAU Note (Signed)
Patient presents to MAU via EMS c/o ctx. Patient has limited to no prenatal care.  Patient reports ETOH use today and cocaine use 3 hours ago.  Patient reports that she is homeless and her baby is up for adoption. Patient states she wants to use the same agency as her last adoption but is unsure of the name of agency.

## 2018-12-15 NOTE — MAU Note (Signed)
Pt reports to MAU for contractions, pt states they started about 2 hours ago, denies fluid leaking or bleeding, confirms feeling movement of baby.

## 2018-12-16 ENCOUNTER — Inpatient Hospital Stay (HOSPITAL_COMMUNITY): Payer: Medicaid Other | Admitting: Anesthesiology

## 2018-12-16 ENCOUNTER — Encounter (HOSPITAL_COMMUNITY): Payer: Self-pay

## 2018-12-16 ENCOUNTER — Other Ambulatory Visit: Payer: Self-pay

## 2018-12-16 DIAGNOSIS — F1914 Other psychoactive substance abuse with psychoactive substance-induced mood disorder: Secondary | ICD-10-CM | POA: Diagnosis present

## 2018-12-16 DIAGNOSIS — Z20828 Contact with and (suspected) exposure to other viral communicable diseases: Secondary | ICD-10-CM | POA: Diagnosis present

## 2018-12-16 DIAGNOSIS — Z3A39 39 weeks gestation of pregnancy: Secondary | ICD-10-CM | POA: Diagnosis not present

## 2018-12-16 DIAGNOSIS — F1290 Cannabis use, unspecified, uncomplicated: Secondary | ICD-10-CM | POA: Diagnosis present

## 2018-12-16 DIAGNOSIS — F322 Major depressive disorder, single episode, severe without psychotic features: Secondary | ICD-10-CM | POA: Diagnosis present

## 2018-12-16 DIAGNOSIS — O26893 Other specified pregnancy related conditions, third trimester: Secondary | ICD-10-CM | POA: Diagnosis present

## 2018-12-16 DIAGNOSIS — O99324 Drug use complicating childbirth: Secondary | ICD-10-CM | POA: Diagnosis present

## 2018-12-16 DIAGNOSIS — O99344 Other mental disorders complicating childbirth: Secondary | ICD-10-CM | POA: Diagnosis present

## 2018-12-16 DIAGNOSIS — Z590 Homelessness: Secondary | ICD-10-CM | POA: Diagnosis not present

## 2018-12-16 DIAGNOSIS — F1420 Cocaine dependence, uncomplicated: Secondary | ICD-10-CM | POA: Diagnosis present

## 2018-12-16 LAB — DIFFERENTIAL
Abs Immature Granulocytes: 0.06 10*3/uL (ref 0.00–0.07)
Basophils Absolute: 0 10*3/uL (ref 0.0–0.1)
Basophils Relative: 0 %
Eosinophils Absolute: 0.2 10*3/uL (ref 0.0–0.5)
Eosinophils Relative: 2 %
Immature Granulocytes: 1 %
Lymphocytes Relative: 23 %
Lymphs Abs: 2.4 10*3/uL (ref 0.7–4.0)
Monocytes Absolute: 0.6 10*3/uL (ref 0.1–1.0)
Monocytes Relative: 5 %
Neutro Abs: 7.4 10*3/uL (ref 1.7–7.7)
Neutrophils Relative %: 69 %

## 2018-12-16 LAB — TYPE AND SCREEN
ABO/RH(D): A POS
Antibody Screen: NEGATIVE

## 2018-12-16 LAB — RAPID URINE DRUG SCREEN, HOSP PERFORMED
Amphetamines: NOT DETECTED
Barbiturates: NOT DETECTED
Benzodiazepines: NOT DETECTED
Cocaine: POSITIVE — AB
Opiates: NOT DETECTED
Tetrahydrocannabinol: POSITIVE — AB

## 2018-12-16 LAB — CBC
HCT: 33.3 % — ABNORMAL LOW (ref 36.0–46.0)
Hemoglobin: 10.7 g/dL — ABNORMAL LOW (ref 12.0–15.0)
MCH: 26 pg (ref 26.0–34.0)
MCHC: 32.1 g/dL (ref 30.0–36.0)
MCV: 81 fL (ref 80.0–100.0)
Platelets: 259 10*3/uL (ref 150–400)
RBC: 4.11 MIL/uL (ref 3.87–5.11)
RDW: 14.1 % (ref 11.5–15.5)
WBC: 10.7 10*3/uL — ABNORMAL HIGH (ref 4.0–10.5)
nRBC: 0 % (ref 0.0–0.2)

## 2018-12-16 LAB — SARS CORONAVIRUS 2 BY RT PCR (HOSPITAL ORDER, PERFORMED IN ~~LOC~~ HOSPITAL LAB): SARS Coronavirus 2: NEGATIVE

## 2018-12-16 LAB — HEPATITIS B SURFACE ANTIGEN: Hepatitis B Surface Ag: NONREACTIVE

## 2018-12-16 LAB — RAPID HIV SCREEN (HIV 1/2 AB+AG)
HIV 1/2 Antibodies: NONREACTIVE
HIV-1 P24 Antigen - HIV24: NONREACTIVE

## 2018-12-16 LAB — GROUP B STREP BY PCR: Group B strep by PCR: NEGATIVE

## 2018-12-16 LAB — RPR: RPR Ser Ql: NONREACTIVE

## 2018-12-16 LAB — ABO/RH: ABO/RH(D): A POS

## 2018-12-16 MED ORDER — SIMETHICONE 80 MG PO CHEW: 80.0000 mg | CHEWABLE_TABLET | ORAL | Status: DC | PRN

## 2018-12-16 MED ORDER — EPHEDRINE 5 MG/ML INJ: 10.0000 mg | INTRAVENOUS | Status: DC | PRN

## 2018-12-16 MED ORDER — DIPHENHYDRAMINE HCL 25 MG PO CAPS: 25.0000 mg | ORAL_CAPSULE | Freq: Four times a day (QID) | ORAL | Status: DC | PRN

## 2018-12-16 MED ORDER — OXYCODONE-ACETAMINOPHEN 5-325 MG PO TABS: 2.0000 | ORAL_TABLET | ORAL | Status: DC | PRN

## 2018-12-16 MED ORDER — TETANUS-DIPHTH-ACELL PERTUSSIS 5-2.5-18.5 LF-MCG/0.5 IM SUSP: 0.5000 mL | Freq: Once | INTRAMUSCULAR | Status: DC

## 2018-12-16 MED ORDER — IBUPROFEN 600 MG PO TABS: 600.0000 mg | ORAL_TABLET | Freq: Four times a day (QID) | ORAL | Status: DC

## 2018-12-16 MED ORDER — ONDANSETRON HCL 4 MG/2ML IJ SOLN: 4.0000 mg | Freq: Four times a day (QID) | INTRAMUSCULAR | Status: DC | PRN

## 2018-12-16 MED ORDER — OXYTOCIN 40 UNITS IN NORMAL SALINE INFUSION - SIMPLE MED
2.5000 [IU]/h | INTRAVENOUS | Status: DC
  Filled 2018-12-16: qty 1000

## 2018-12-16 MED ORDER — ACETAMINOPHEN 325 MG PO TABS: 650.0000 mg | ORAL_TABLET | ORAL | Status: DC | PRN

## 2018-12-16 MED ORDER — FENTANYL-BUPIVACAINE-NACL 0.5-0.125-0.9 MG/250ML-% EP SOLN
EPIDURAL | Status: AC
  Filled 2018-12-16: qty 250

## 2018-12-16 MED ORDER — BENZOCAINE-MENTHOL 20-0.5 % EX AERO: 1.0000 "application " | INHALATION_SPRAY | CUTANEOUS | Status: DC | PRN

## 2018-12-16 MED ORDER — ETONOGESTREL 68 MG ~~LOC~~ IMPL
68.0000 mg | DRUG_IMPLANT | Freq: Once | SUBCUTANEOUS | Status: DC
  Filled 2018-12-16: qty 1

## 2018-12-16 MED ORDER — IBUPROFEN 100 MG/5ML PO SUSP
600.0000 mg | Freq: Four times a day (QID) | ORAL | Status: DC
  Administered 2018-12-16 – 2018-12-17 (×4): 600 mg via ORAL
  Filled 2018-12-16 (×5): qty 30

## 2018-12-16 MED ORDER — PHENYLEPHRINE 40 MCG/ML (10ML) SYRINGE FOR IV PUSH (FOR BLOOD PRESSURE SUPPORT): 80.0000 ug | PREFILLED_SYRINGE | INTRAVENOUS | Status: DC | PRN

## 2018-12-16 MED ORDER — DIBUCAINE (PERIANAL) 1 % EX OINT: 1.0000 "application " | TOPICAL_OINTMENT | CUTANEOUS | Status: DC | PRN

## 2018-12-16 MED ORDER — LIDOCAINE HCL (PF) 1 % IJ SOLN: 30.0000 mL | INTRAMUSCULAR | Status: DC | PRN

## 2018-12-16 MED ORDER — OXYTOCIN BOLUS FROM INFUSION
500.0000 mL | Freq: Once | INTRAVENOUS | Status: AC
  Administered 2018-12-16: 500 mL via INTRAVENOUS

## 2018-12-16 MED ORDER — SODIUM CHLORIDE (PF) 0.9 % IJ SOLN
INTRAMUSCULAR | Status: DC | PRN
  Administered 2018-12-16: 10 mL/h via EPIDURAL

## 2018-12-16 MED ORDER — SENNOSIDES-DOCUSATE SODIUM 8.6-50 MG PO TABS: 2.0000 | ORAL_TABLET | ORAL | Status: DC

## 2018-12-16 MED ORDER — FENTANYL-BUPIVACAINE-NACL 0.5-0.125-0.9 MG/250ML-% EP SOLN: 12.0000 mL/h | EPIDURAL | Status: DC | PRN

## 2018-12-16 MED ORDER — DIPHENHYDRAMINE HCL 50 MG/ML IJ SOLN: 12.5000 mg | INTRAMUSCULAR | Status: DC | PRN

## 2018-12-16 MED ORDER — LACTATED RINGERS IV SOLN
500.0000 mL | Freq: Once | INTRAVENOUS | Status: AC
  Administered 2018-12-16: 500 mL via INTRAVENOUS

## 2018-12-16 MED ORDER — FENTANYL CITRATE (PF) 100 MCG/2ML IJ SOLN
100.0000 ug | INTRAMUSCULAR | Status: DC | PRN
  Administered 2018-12-16: 100 ug via INTRAVENOUS
  Filled 2018-12-16: qty 2

## 2018-12-16 MED ORDER — WITCH HAZEL-GLYCERIN EX PADS: 1.0000 "application " | MEDICATED_PAD | CUTANEOUS | Status: DC | PRN

## 2018-12-16 MED ORDER — SOD CITRATE-CITRIC ACID 500-334 MG/5ML PO SOLN: 30.0000 mL | ORAL | Status: DC | PRN

## 2018-12-16 MED ORDER — LIDOCAINE-EPINEPHRINE (PF) 2 %-1:200000 IJ SOLN
INTRAMUSCULAR | Status: DC | PRN
  Administered 2018-12-16: 1 mL via EPIDURAL
  Administered 2018-12-16: 2 mL via EPIDURAL

## 2018-12-16 MED ORDER — LACTATED RINGERS IV SOLN: 500.0000 mL | INTRAVENOUS | Status: DC | PRN

## 2018-12-16 MED ORDER — OXYCODONE-ACETAMINOPHEN 5-325 MG PO TABS: 1.0000 | ORAL_TABLET | ORAL | Status: DC | PRN

## 2018-12-16 MED ORDER — ACETAMINOPHEN 160 MG/5ML PO SOLN
650.0000 mg | ORAL | Status: DC | PRN
  Administered 2018-12-16: 650 mg via ORAL
  Filled 2018-12-16 (×3): qty 20.3

## 2018-12-16 MED ORDER — ONDANSETRON HCL 4 MG/2ML IJ SOLN: 4.0000 mg | INTRAMUSCULAR | Status: DC | PRN

## 2018-12-16 MED ORDER — LACTATED RINGERS IV SOLN
INTRAVENOUS | Status: DC
  Administered 2018-12-16: 03:00:00 via INTRAVENOUS

## 2018-12-16 MED ORDER — PRENATAL MULTIVITAMIN CH: 1.0000 | ORAL_TABLET | Freq: Every day | ORAL | Status: DC

## 2018-12-16 MED ORDER — ZOLPIDEM TARTRATE 5 MG PO TABS
5.0000 mg | ORAL_TABLET | Freq: Every evening | ORAL | Status: DC | PRN
  Administered 2018-12-16: 5 mg via ORAL
  Filled 2018-12-16: qty 1

## 2018-12-16 MED ORDER — COMPLETENATE 29-1 MG PO CHEW
1.0000 | CHEWABLE_TABLET | Freq: Every day | ORAL | Status: DC
  Filled 2018-12-16 (×3): qty 1

## 2018-12-16 MED ORDER — COCONUT OIL OIL: 1.0000 "application " | TOPICAL_OIL | Status: DC | PRN

## 2018-12-16 MED ORDER — ONDANSETRON HCL 4 MG PO TABS: 4.0000 mg | ORAL_TABLET | ORAL | Status: DC | PRN

## 2018-12-16 MED ORDER — LIDOCAINE HCL 1 % IJ SOLN
0.0000 mL | Freq: Once | INTRAMUSCULAR | Status: DC | PRN
  Filled 2018-12-16: qty 20

## 2018-12-16 NOTE — Progress Notes (Signed)
Labor Progress Note Cassidy Underwood is a 23 y.o. [phone] at [redacted]w[redacted]d presented for SOL  S:  Patient sleeping  O:  BP 107/62   Pulse 76   Temp 98.5 F (36.9 C) (Oral)   Resp 20   Ht 4\' 10"  (1.473 m)   Wt 52.2 kg Comment: last doctors appointment  SpO2 100%   BMI 24.04 kg/m   Fetal Tracing:  Baseline: 135 Variability: mdoerate Accels: 15x15 Decels: none  Toco: 5-10   CVE: Dilation: 5 Effacement (%): 100 Station: 0 Presentation: Vertex Exam by:: Len Blalock CNM   A&P: 23 y.o. [id number] [redacted]w[redacted]d SOL #Labor: Cervix unchanged. Discussed with patient risks and benefits of AROM for augmentation of labor. Patient agreeable to plan of care. AROM with moderate amount of clear fluid. Patient and FHR tolerated procedure well.  #Pain: epidural #FWB: Cat 1 #GBS negative  Wende Mott, CNM 4:16 AM

## 2018-12-16 NOTE — Progress Notes (Signed)
Consent signed. Anesthesia team at bedside to draw blood for blood patch.

## 2018-12-16 NOTE — Anesthesia Procedure Notes (Signed)
Epidural Patient location during procedure: OB Start time: 12/16/2018 2:00 AM End time: 12/16/2018 2:15 AM  Staffing Anesthesiologist: Freddrick March, MD Performed: anesthesiologist   Preanesthetic Checklist Completed: patient identified, pre-op evaluation, timeout performed, IV checked, risks and benefits discussed and monitors and equipment checked  Epidural Patient position: sitting Prep: site prepped and draped and DuraPrep Patient monitoring: continuous pulse ox, blood pressure, heart rate and cardiac monitor Approach: midline Location: L3-L4 Injection technique: LOR air  Needle:  Needle type: Tuohy  Needle gauge: 17 G Needle length: 9 cm Needle insertion depth: 3 cm Catheter type: closed end flexible Catheter size: 19 Gauge Catheter at skin depth: 9 cm Test dose: negative  Assessment Sensory level: T8 Events: blood not aspirated, injection not painful, no injection resistance, negative IV test and no paresthesia  Additional Notes Patient identified. Risks/Benefits/Options discussed with patient including but not limited to bleeding, infection, nerve damage, paralysis, failed block, incomplete pain control, headache, blood pressure changes, nausea, vomiting, reactions to medication both or allergic, itching and postpartum back pain. Confirmed with bedside nurse the patient's most recent platelet count. Confirmed with patient that they are not currently taking any anticoagulation, have any bleeding history or any family history of bleeding disorders. Patient expressed understanding and wished to proceed. All questions were answered. Sterile technique was used throughout the entire procedure. Please see nursing notes for vital signs. Test dose was given through epidural catheter and negative prior to continuing to dose epidural or start infusion. Warning signs of high block given to the patient including shortness of breath, tingling/numbness in hands, complete motor block, or  any concerning symptoms with instructions to call for help. Patient was given instructions on fall risk and not to get out of bed. All questions and concerns addressed with instructions to call with any issues or inadequate analgesia.  Reason for block:procedure for pain

## 2018-12-16 NOTE — Anesthesia Preprocedure Evaluation (Signed)
Anesthesia Evaluation  Patient identified by MRN, date of birth, ID band Patient awake    Reviewed: Allergy & Precautions, NPO status , Patient's Chart, lab work & pertinent test results  Airway Mallampati: II  TM Distance: >3 FB Neck ROM: Full    Dental no notable dental hx.    Pulmonary neg pulmonary ROS,    Pulmonary exam normal breath sounds clear to auscultation       Cardiovascular negative cardio ROS Normal cardiovascular exam Rhythm:Regular Rate:Normal     Neuro/Psych PSYCHIATRIC DISORDERS Depression negative neurological ROS     GI/Hepatic negative GI ROS, (+)     substance abuse  alcohol use, cocaine use and marijuana use, UDS positive for cocaine. Has received epidurals in the past while positive for cocaine. Pt aware of risks associated with general anesthesia and cocaine use.    Endo/Other  negative endocrine ROS  Renal/GU negative Renal ROS  negative genitourinary   Musculoskeletal negative musculoskeletal ROS (+)   Abdominal   Peds  Hematology negative hematology ROS (+)   Anesthesia Other Findings   Reproductive/Obstetrics (+) Pregnancy                             Anesthesia Physical Anesthesia Plan  ASA: III  Anesthesia Plan: Epidural   Post-op Pain Management:    Induction:   PONV Risk Score and Plan: Treatment may vary due to age or medical condition  Airway Management Planned: Natural Airway  Additional Equipment:   Intra-op Plan:   Post-operative Plan:   Informed Consent: I have reviewed the patients History and Physical, chart, labs and discussed the procedure including the risks, benefits and alternatives for the proposed anesthesia with the patient or authorized representative who has indicated his/her understanding and acceptance.       Plan Discussed with: Anesthesiologist  Anesthesia Plan Comments: (Patient identified. Risks, benefits,  options discussed with patient including but not limited to bleeding, infection, nerve damage, paralysis, failed block, incomplete pain control, headache, blood pressure changes, nausea, vomiting, reactions to medication, itching, and post partum back pain. Confirmed with bedside nurse the patient's most recent platelet count. Confirmed with the patient that they are not taking any anticoagulation, have any bleeding history or any family history of bleeding disorders. Patient expressed understanding and wishes to proceed. All questions were answered. )        Anesthesia Quick Evaluation

## 2018-12-16 NOTE — Progress Notes (Signed)
CSW met with [name] in MAU (room 25) after receiving a telephone call from bedside regarding [name] being away from [name].  CSW utilized the opportunity to assess for DV.  [name] denied currently DV and reported feeling safe with [name].  CSW processed with [name] what DV looks like and resources that are available to [name].  CSW encouraged [name] ask and seek help if needed.  CSW also informed [name] to ask for pineapple juice if [name] starts to feel unsafe while inpatient; [name] agreed.   [name] became tearful as [name] explained her pain level to CSW.  CSW validated [name]'s feelings and emotions.  [name] shared the need for [name] to be on birth control.  [name] stated, "You just don't know how I feel having my babies and not being able to take care of them. I got to get myself together so I can do better." CSW offered [name] inpatient and outpatient resources and [name] declined.  [name] reported "My friend Cindy has a lot of resources for me and she is going to help me." [name] expressed interest in exploring birth control options while [name] was not present; CSW updated [name]'s bedside nurse.   Haide Klinker Boyd-Gilyard, MSW, LCSW Clinical Social Work [phone] 

## 2018-12-16 NOTE — Anesthesia Procedure Notes (Signed)
Epidural Patient location during procedure: ED Start time: 12/16/2018 1:38 PM End time: 12/16/2018 2:08 PM  Staffing Anesthesiologist: Lynda Rainwater, MD Performed: anesthesiologist   Preanesthetic Checklist Completed: patient identified, site marked, surgical consent, pre-op evaluation, timeout performed, IV checked, risks and benefits discussed and monitors and equipment checked  Additional Notes Epidural blood patch attempted through existing epidural catheter.  Only able to get 55ml of blood through catheter before catheter clotted off and unable to inject further.  Patient did not want to proceed with another epidural access today.  Catheter was removed and patient advised that if symptoms persisted another attempt at blood patch could be performed before discharge.Reason for block:Epidural Blood Patch

## 2018-12-16 NOTE — Progress Notes (Signed)
When pt sat up on side of bed to transfer to wheelchair she complained of a headache in the front of her head.  She stated it hurt more with movement.  Had not mentioned headache prior to movement and was sitting up in bed.  Epidural catheter left in because prior RN had communicated this was the plan as discussed with Dr. Lanetta Inch.

## 2018-12-16 NOTE — Clinical Social Work Maternal (Signed)
CLINICAL SOCIAL WORK MATERNAL/CHILD NOTE  Patient Details  Name: Cassidy Underwood MRN: [id number] Date of Birth: 11/26/1995  Date:  12/16/2018  Clinical Social Worker Initiating Note:   Boyd-Gilyard Date/Time: Initiated:  12/16/18/1019     Child's Name:  Name unknown   Biological Parents:  Mother, Father   Need for Interpreter:  None   Reason for Referral:  Behavioral Health Concerns, Current Substance Use/Substance Use During Pregnancy , Homelessness, Adoption   Address:  [place] [address]    Phone number:  [phone] (home)     Additional phone number:   Household Members/Support Persons (HM/SP):   Household Member/Support Person 1([name] reported that she and [name] are currently homeless.)   HM/SP Name Relationship DOB or Age  HM/SP -1 Murvin Herbin [name] 04/10/1970  HM/SP -2        HM/SP -3        HM/SP -4        HM/SP -5        HM/SP -6        HM/SP -7        HM/SP -8          Natural Supports (not living in the home):  Friends([name] reported that Cindy Tenant to be a support for [name].)   Professional Supports: Case Manager/Social Worker([name]'s adoption socical worker is Erin Clark)   Employment: Unemployed   Type of Work:     Education:      Homebound arranged:    Financial Resources:  Medicaid   Other Resources:      Cultural/Religious Considerations Which May Impact Care:  Per [name]'s Face Sheet, [name] is Christian. Strengths:      Psychotropic Medications:         Pediatrician:       Pediatrician List:   [place]    [place]    [place]    [place]    [place]    [place]      Pediatrician Fax Number:    Risk Factors/Current Problems:      Cognitive State:  Alert , Able to Concentrate , Poor Insight , Linear Thinking    Mood/Affect:  Calm , Comfortable , Interested , Relaxed    CSW Assessment: CSW met with [name] and [name] in room 514 to complete a clinical assessment for SA hx, MH hx,  homelessness, and couple wanting to complete an adoption plan. When CSW arrived, [name] was resting in bed and [name] was holding infant. The couple immediately expressed remembering CSW from [name]'s last two pregnancy.  Although the couple remembered CSW, CSW explained CSW's role and the need for CSW to complete an assessment.  [name] gave CSW permission to complete the assessment while [name] was present. [name] was polite and was easy to engage.  [name] appeared irritated and numerous times during the assessment asked [name] not to answer CSW's questions.   CSW was unable to assess for DV due to [name] being present and presenting defensively.  CSW asked about [name]'s and [name]'s currently living arrangement and [name] shared, "We are still homeless; that's why we are doing another adoption." CSW  attempted to provide housing resources to the couple and [name] declined and rudely kept interrupting CSW.    CSW asked about [name]'s substance use during [name]'s pregnancy and [name] stated, "None of that matters now because we are giving the baby up for adoption." CSW made the couple aware that [name] had a positive screen for THC and    Cocaine on admission; [name] did not confirm or deny her use. CSW made the couple aware that CSW will need to make a CPS report due to the couple homelessness, and substance use hx.  [name] irritation appeared to increase and he communicated, "We are no talking to anyone from no damn CPS.  We have been advise that we do not have to talk with them."    CSW asked about [name]'s MH hx and recent inpatient admission and [name] communicated, "I don't feel well, I don't want to talk about that."  CSW made couple aware that CSW will keep them updated regarding CPS and date and time for the couple to sign adoption documents.  [name] gave CSW verbal consent to speak with Adoption social worker Erin Clarke and attorney Briton Wright.    If the couple do not complete an adoption plan there are barriers to infant discharging to [name] and  [name].  CSW Plan/Description:  Psychosocial Support and Ongoing Assessment of Needs, Other Patient/Family Education, Hospital Drug Screen Policy Information, Other Information/Referral to Community Resources, Child Protective Service Report , CSW Awaiting CPS Disposition Plan, CSW Will Continue to Monitor Umbilical Cord Tissue Drug Screen Results and Make Report if Warranted    Boyd-Gilyard, MSW, LCSW Clinical Social Work [phone]    D BOYD-GILYARD, LCSW 12/16/2018, 12:24 PM  

## 2018-12-16 NOTE — Anesthesia Postprocedure Evaluation (Signed)
Anesthesia Post Note  Patient: Cassidy Underwood [facility]  Procedure(s) Performed: AN AD [place]ki     Patient location during evaluation: Mother Baby Anesthesia Type: Epidural Level of consciousness: awake Pain management: satisfactory to patient Vital Signs Assessment: post-procedure vital signs reviewed and stable Respiratory status: spontaneous breathing Cardiovascular status: stable Anesthetic complications: no    Last Vitals:  Vitals:   12/16/18 1318 12/16/18 1400  BP: 125/71 126/72  Pulse: 85 68  Resp: 18   Temp: 37.2 C   SpO2: 100% 98%    Last Pain:  Vitals:   12/16/18 1319  TempSrc:   PainSc: 9    Pain Goal: Patients Stated Pain Goal: 3 (12/16/18 1035)   Pt has headache and is planning an epidural blood patch.  Pt sitting up in bed.                  Cassidy Underwood

## 2018-12-16 NOTE — Discharge Summary (Signed)
Postpartum Discharge Summary     Patient Name: Cassidy Underwood [facility] DOB: 01-24-1996 MRN: [id number]  Date of admission: 12/15/2018 Delivering Provider: Wende Mott   Date of discharge: 12/17/2018  Admitting diagnosis: CTX Intrauterine pregnancy: [redacted]w[redacted]d    Secondary diagnosis:  Active Problems:   Homeless   Substance abuse affecting pregnancy, antepartum   Pregnancy with adoption planned   Major depressive disorder, single episode, severe without psychotic features ([place]   Substance induced mood disorder ([place]   Normal labor   [redacted] weeks gestation of pregnancy  Additional problems: substance abuse in pregnancy, No prenatal care     Discharge diagnosis: Term Pregnancy Delivered                                                                                                Post partum procedures:None  Augmentation: AROM  Complications: None  Hospital course:  Onset of Labor With Vaginal Delivery     23y.o. yo [id number] 324w2das admitted in Active Labor on 12/15/2018. Patient had an uncomplicated labor course as follows:  Membrane Rupture Time/Date: 4:13 AM ,12/16/2018   Intrapartum Procedures: Episiotomy: None [1]                                         Lacerations:  None [1]  Patient had a delivery of a Viable infant. 12/16/2018  Information for the patient's newborn:  WeSharnee, Douglass0[[id number]]Delivery Method: Vaginal, Spontaneous(Filed from Delivery Summary)     Pateint had an uncomplicated postpartum course. Anesthesia placed blood patch. Patient declined any problems on day of discharge thereafter. Social work involved and patient able to sign adoption paperwork. No barriers to discharge. She declined birth control at this time despite asking multiple times when partner not present. Iron tablets prescribed on discharge. She is ambulating, tolerating a regular diet, passing flatus, and urinating well. Patient is discharged home in stable condition on  12/17/18.  Delivery time: 6:34 AM    Magnesium Sulfate received: No BMZ received: No Rhophylac:No MMR:No Transfusion:No  Physical exam  Vitals:   12/16/18 1453 12/16/18 1732 12/16/18 2055 12/17/18 0622  BP: 129/89 126/77 117/74 137/78  Pulse: 75 79 86 60  Resp: '17 18 20 ' (!) 22  Temp: 98.2 F (36.8 C) 98.5 F (36.9 C) 98.1 F (36.7 C) 97.7 F (36.5 C)  TempSrc: Oral Oral Oral Oral  SpO2: 100% 100% 100%   Weight:      Height:       General: alert, cooperative and no distress Lochia: appropriate Uterine Fundus: firm DVT Evaluation: No evidence of DVT seen on physical exam. Labs: Lab Results  Component Value Date   WBC 8.9 12/17/2018   HGB 9.5 (L) 12/17/2018   HCT 30.2 (L) 12/17/2018   MCV 82.7 12/17/2018   PLT 257 12/17/2018   CMP Latest Ref Rng & Units 02/25/2018  Glucose 70 - 99 mg/dL 101(H)  BUN 6 - 20 mg/dL 12  Creatinine 0.44 - 1.00 mg/dL  0.58  Sodium 135 - 145 mmol/L 137  Potassium 3.5 - 5.1 mmol/L 4.1  Chloride 98 - 111 mmol/L 107  CO2 22 - 32 mmol/L 24  Calcium 8.9 - 10.3 mg/dL 8.7(L)  Total Protein 6.5 - 8.1 g/dL -  Total Bilirubin 0.3 - 1.2 mg/dL -  Alkaline Phos 38 - 126 U/L -  AST 15 - 41 U/L -  ALT 0 - 44 U/L -    Discharge instruction: per After Visit Summary and "Baby and Me Booklet".  After visit meds:  Allergies as of 12/17/2018   No Known Allergies     Medication List    TAKE these medications   acetaminophen 160 MG/5ML solution Commonly known as: TYLENOL Take 20.3 mLs (650 mg total) by mouth every 6 (six) hours as needed (pain scale < 4).   diphenhydrAMINE 25 mg capsule Commonly known as: BENADRYL Take 1 capsule (25 mg total) by mouth every 6 (six) hours as needed for itching.   ferrous sulfate 325 (65 FE) MG tablet Take 1 tablet (325 mg total) by mouth daily with breakfast.   ibuprofen 100 MG/5ML suspension Commonly known as: ADVIL Take 30 mLs (600 mg total) by mouth every 8 (eight) hours as needed.   PrePLUS 27-1 MG  Tabs Take 1 tablet by mouth daily.   senna-docusate 8.6-50 MG tablet Commonly known as: Senokot-S Take 2 tablets by mouth daily.   simethicone 80 MG chewable tablet Commonly known as: MYLICON Chew 1 tablet (80 mg total) by mouth as needed for flatulence.   witch hazel-glycerin pad Commonly known as: TUCKS Apply 1 application topically as needed for hemorrhoids.       Diet: routine diet  Activity: Advance as tolerated. Pelvic rest for 6 weeks.   Outpatient follow up:4 weeks Follow up Appt: Future Appointments  Date Time Provider [place]  12/31/2018  1:15 PM [place]  01/16/2019  3:10 PM Laury Deep, CNM [facility] None   Follow up Visit:  Please schedule this patient for PP visit in: 4 weeks High risk pregnancy complicated by: substance abuse Delivery mode:  SVD Anticipated Birth Control:  Nexplanon PP Procedures needed: n/a  Schedule Integrated [place] visit: yes Provider: Any provider   Newborn Data: Live born female  Birth Weight:   APGAR: ,   Newborn Delivery   Birth date/time: 12/16/2018 06:34:00 Delivery type: Vaginal, Spontaneous      Baby Feeding: NA Disposition:adoption

## 2018-12-16 NOTE — Progress Notes (Signed)
Dr Sabra Heck called & report given re: pt tearful & demanding to return to mother baby unit d/t wanting to see "church friend, miss baby & eat".  MD states it is ok to for to return to 514 without restrictions.

## 2018-12-16 NOTE — Anesthesia Preprocedure Evaluation (Signed)
Anesthesia Evaluation  Patient identified by MRN, date of birth, ID band Patient awake    Reviewed: Allergy & Precautions, NPO status , Patient's Chart, lab work & pertinent test results  Airway Mallampati: II  TM Distance: >3 FB Neck ROM: Full    Dental no notable dental hx.    Pulmonary neg pulmonary ROS,    Pulmonary exam normal breath sounds clear to auscultation       Cardiovascular negative cardio ROS Normal cardiovascular exam Rhythm:Regular Rate:Normal     Neuro/Psych Depression negative neurological ROS  negative psych ROS   GI/Hepatic negative GI ROS, Neg liver ROS, (+)       alcohol use, cocaine use and marijuana use, UDS positive for cocaine. Has received epidurals in the past while positive for cocaine. Pt aware of risks associated with general anesthesia and cocaine use.    Endo/Other  negative endocrine ROS  Renal/GU negative Renal ROS  negative genitourinary   Musculoskeletal negative musculoskeletal ROS (+)   Abdominal   Peds negative pediatric ROS (+)  Hematology negative hematology ROS (+)   Anesthesia Other Findings   Reproductive/Obstetrics negative OB ROS                             Anesthesia Physical  Anesthesia Plan  ASA: III  Anesthesia Plan: Epidural   Post-op Pain Management:    Induction:   PONV Risk Score and Plan: 2 and Treatment may vary due to age or medical condition  Airway Management Planned: Natural Airway  Additional Equipment:   Intra-op Plan:   Post-operative Plan:   Informed Consent: I have reviewed the patients History and Physical, chart, labs and discussed the procedure including the risks, benefits and alternatives for the proposed anesthesia with the patient or authorized representative who has indicated his/her understanding and acceptance.       Plan Discussed with: Anesthesiologist  Anesthesia Plan Comments:          Anesthesia Quick Evaluation
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
## 2018-12-17 DIAGNOSIS — Z3A39 39 weeks gestation of pregnancy: Secondary | ICD-10-CM

## 2018-12-17 LAB — CBC
HCT: 30.2 % — ABNORMAL LOW (ref 36.0–46.0)
Hemoglobin: 9.5 g/dL — ABNORMAL LOW (ref 12.0–15.0)
MCH: 26 pg (ref 26.0–34.0)
MCHC: 31.5 g/dL (ref 30.0–36.0)
MCV: 82.7 fL (ref 80.0–100.0)
Platelets: 257 10*3/uL (ref 150–400)
RBC: 3.65 MIL/uL — ABNORMAL LOW (ref 3.87–5.11)
RDW: 14.4 % (ref 11.5–15.5)
WBC: 8.9 10*3/uL (ref 4.0–10.5)
nRBC: 0 % (ref 0.0–0.2)

## 2018-12-17 LAB — RUBELLA SCREEN: Rubella: 2.66 index (ref >0.99)

## 2018-12-17 MED ORDER — FERROUS SULFATE 325 (65 FE) MG PO TABS
325.0000 mg | ORAL_TABLET | Freq: Every day | ORAL | Status: DC
  Administered 2018-12-17: 325 mg via ORAL
  Filled 2018-12-17: qty 1

## 2018-12-17 MED ORDER — DIPHENHYDRAMINE HCL 25 MG PO CAPS: 25.0000 mg | ORAL_CAPSULE | Freq: Four times a day (QID) | ORAL | 0 refills | Status: DC | PRN

## 2018-12-17 MED ORDER — ACETAMINOPHEN 160 MG/5ML PO SOLN: 650.0000 mg | Freq: Four times a day (QID) | ORAL | 0 refills | Status: DC | PRN

## 2018-12-17 MED ORDER — SIMETHICONE 80 MG PO CHEW: 80.0000 mg | CHEWABLE_TABLET | ORAL | 0 refills | Status: DC | PRN

## 2018-12-17 MED ORDER — SENNOSIDES-DOCUSATE SODIUM 8.6-50 MG PO TABS: 2.0000 | ORAL_TABLET | ORAL | 0 refills | Status: DC

## 2018-12-17 MED ORDER — PREPLUS 27-1 MG PO TABS: 1.0000 | ORAL_TABLET | Freq: Every day | ORAL | 13 refills | Status: DC

## 2018-12-17 MED ORDER — WITCH HAZEL-GLYCERIN EX PADS: 1.0000 "application " | MEDICATED_PAD | CUTANEOUS | 12 refills | Status: DC | PRN

## 2018-12-17 MED ORDER — FERROUS SULFATE 325 (65 FE) MG PO TABS: 325.0000 mg | ORAL_TABLET | Freq: Every day | ORAL | 0 refills | Status: DC

## 2018-12-17 MED ORDER — IBUPROFEN 100 MG/5ML PO SUSP: 600.0000 mg | Freq: Three times a day (TID) | ORAL | 2 refills | Status: DC | PRN

## 2018-12-17 MED FILL — FERROUS SULFATE 325 MG TAB: 325 (65 FE) | 100 days supply | Qty: 100 | Fill #0

## 2018-12-17 MED FILL — BANOPHEN 25 MG CAPSULE: 25 | 25 days supply | Qty: 100 | Fill #0

## 2018-12-17 MED FILL — A.E.R. WITCH HAZEL PADS: 10 days supply | Qty: 40 | Fill #0

## 2018-12-17 MED FILL — PrePLUS 27-1 MG TABS: 27-1 | 30 days supply | Qty: 30 | Fill #0

## 2018-12-17 MED FILL — MI-ACID GAS 80 MG TAB CHEW: 80 | 30 days supply | Qty: 100 | Fill #0

## 2018-12-17 MED FILL — STOOL SOFTENER-LAXATIVE TAB: 50-8.6 | 50 days supply | Qty: 100 | Fill #0

## 2018-12-17 MED FILL — AUROPHEN CHILDRENS 160 MG/5: 160 | 2 days supply | Qty: 118 | Fill #0

## 2018-12-17 MED FILL — IBUPROFEN 100 MG/5 ML SUSP: 100 | 2 days supply | Qty: 150 | Fill #0

## 2018-12-17 NOTE — Progress Notes (Signed)
CSW met with [name] and [name] at [name]'s bedside. CSW confirmed that the couple wants to move forward with adoption process (both parents confirmed). CSW will arrive at 1pm to take parents to conference room to sign papers with adoption social worker (Erin Clark) and attorney (Briton Wright).  Rani Sisney Boyd-Gilyard, MSW, LCSW Clinical Social Work [phone]  

## 2018-12-17 NOTE — Progress Notes (Signed)
CSW provided an escort for [name] and [name] to the 4th floor conference room.  [name] and [name] were greeted by Adoption Social Worker and Adoption attorney.  CSW left the meeting and adoption social worker agreed to call CSW if CSW is needed or when all necessary documents were signed.  CSW received a call from adoption social worker and was informed that all documents were signed and [name] and [name] left the meeting room.   CSW escorted attorney and social worker to 5th floor nursery to see infant.  They both were given a medical update and expected date of discharge for infant. CSW obtained all necessary documents and placed them in infant's chart.  [name] was discharged and left the hospital with [name].   CSW will keep adoption social worker updated.   Infant will be discharged to adoption social worker Cassidy Underwood.   [name], MSW, LCSW Clinical Social Work [phone] 

## 2018-12-17 NOTE — Progress Notes (Signed)
Eventful overnight. Baby increasingly agitated. [name] providing care and speaking for [name]. [name] showing agitation towards baby with loud tone. During rounds this morning, mom was visibly upset. She states to [name] "are you going to leave so I can tell her what I need to say?" He replied with "I'm not going anywhere so you can tell your lies." [name] states "You're not going to keep doing this year after year. You're not going to toss my baby around just because you're mad". Upon assessment baby is lying in crib, flailing arms, crying. He appears to be hungry, rooting. His diaper is wet with urine and partially covering his bottom. [name] continues to argue back and forth with [name]. [name] states "he needs to be here so he can sign those papers so that my baby can go to a good, good home". I asked if I could take baby to the nursery and she replied with "yes please". P/C to charge RN as well as security. With all parties present, [name] stated she has nothing to say and that the [name] can stay. She was very quiet and appeared to be "shutdown". Security offered help and encouraged her to call if she needs any help.  During hand off, [name] asked for her MD as she has "questions". I believe this may be about birth control.

## 2018-12-31 ENCOUNTER — Other Ambulatory Visit: Payer: Self-pay

## 2018-12-31 ENCOUNTER — Encounter: Payer: Self-pay | Admitting: Obstetrics & Gynecology

## 2018-12-31 ENCOUNTER — Ambulatory Visit: Payer: Medicaid Other | Admitting: Clinical

## 2018-12-31 DIAGNOSIS — Z91199 Patient's noncompliance with other medical treatment and regimen due to unspecified reason: Secondary | ICD-10-CM

## 2018-12-31 DIAGNOSIS — Z5329 Procedure and treatment not carried out because of patient's decision for other reasons: Secondary | ICD-10-CM

## 2018-12-31 NOTE — BH Specialist Note (Signed)
Pt did not arrive to video visit and did not answer the phone ; Left HIPPA-compliant message to call back Roselyn Reef from [facility] at [phone].  ; left MyChart message for patient.    [place] via Telemedicine Video Visit  12/31/2018 [facility] [phone]  Garlan Fair

## 2019-01-13 ENCOUNTER — Encounter (HOSPITAL_COMMUNITY): Payer: Self-pay | Admitting: *Deleted

## 2019-01-16 ENCOUNTER — Ambulatory Visit: Payer: Medicaid Other | Admitting: Obstetrics and Gynecology

## 2019-01-31 MED FILL — ZOLPIDEM TARTRATE 10 MG TAB: 10 | 15 days supply | Qty: 15 | Fill #0

## 2019-06-12 IMAGING — US US OB DETAIL+14 WK
1 series · 13 of 28 positions shown · non-contrast
Comparison: none

[Series 1: us ob detail+14 wk · 13 of 75 slices shown]
[im 3/75]
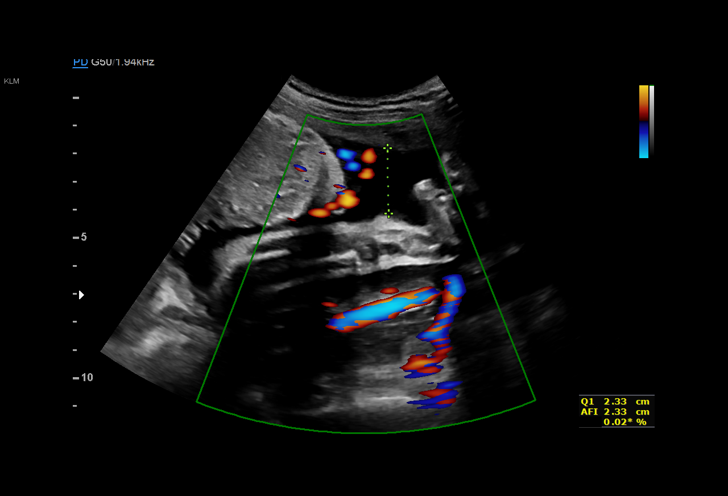
[im 9/75]
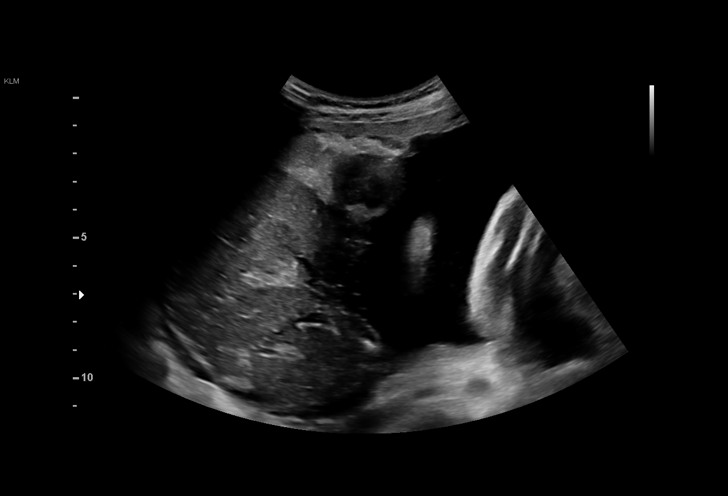
[im 14/75]
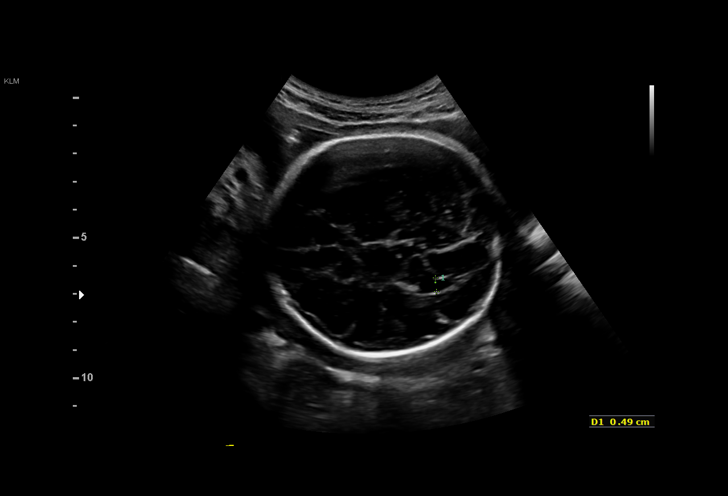
[im 20/75]
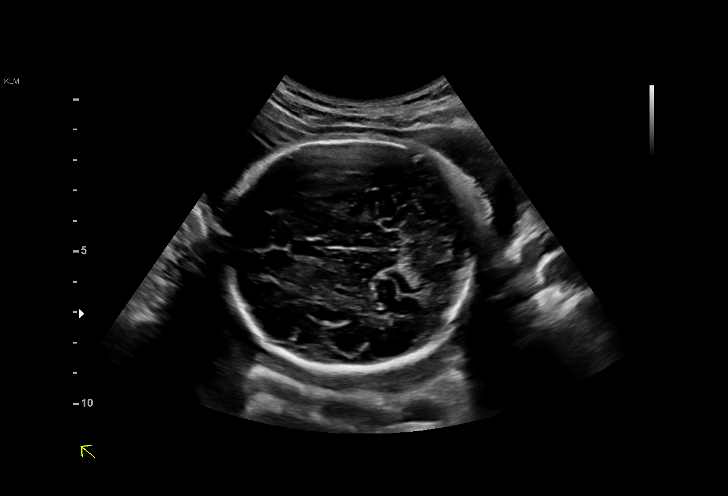
[im 25/75]
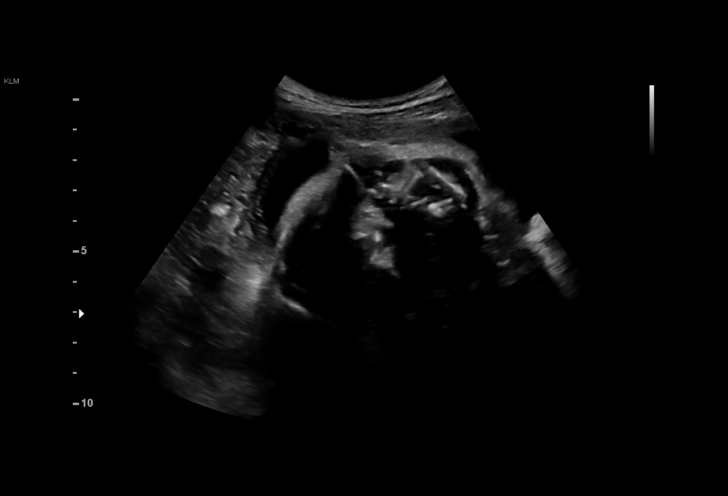
[im 31/75]
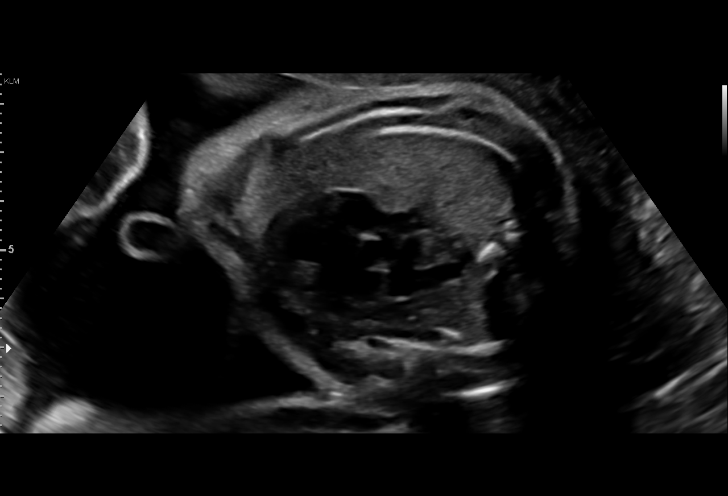
[im 39/75]
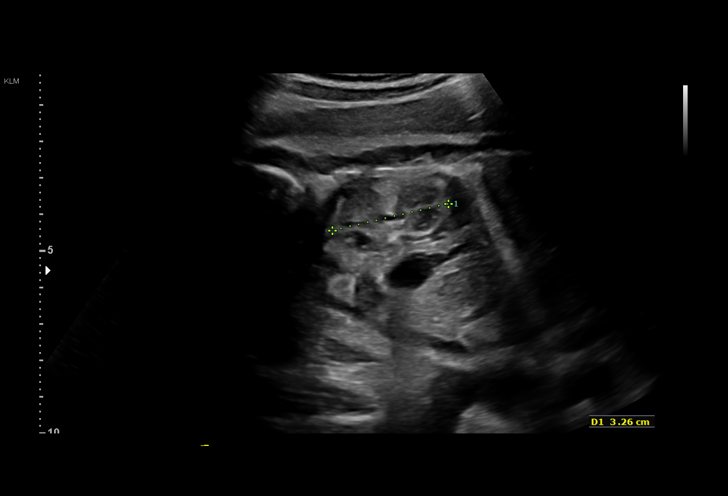
[im 44/75]
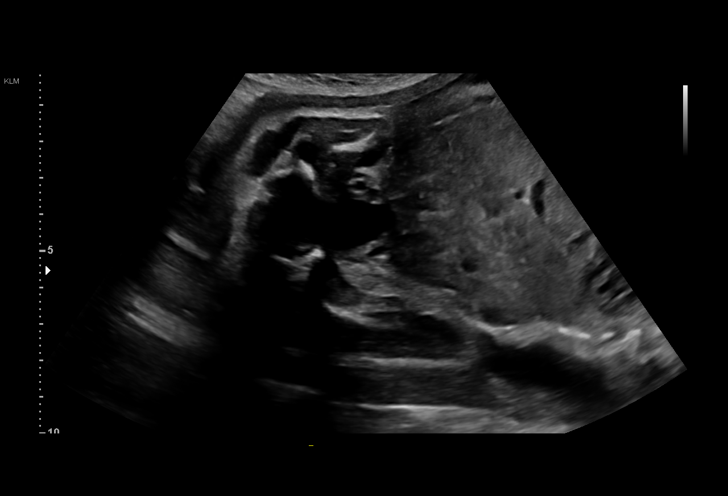
[im 50/75]
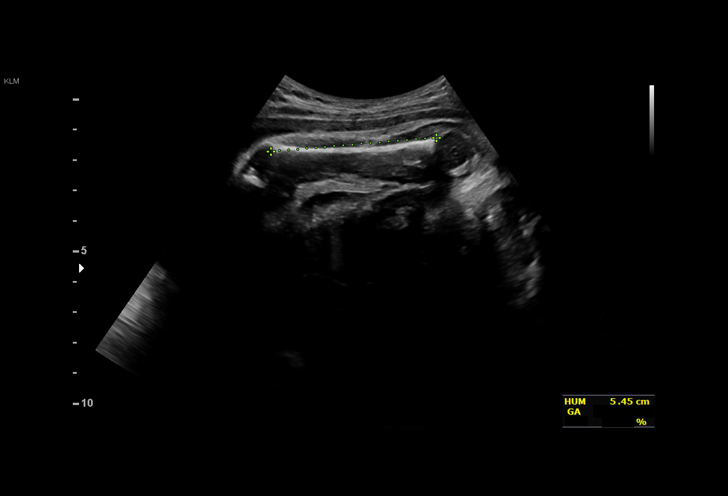
[im 55/75]
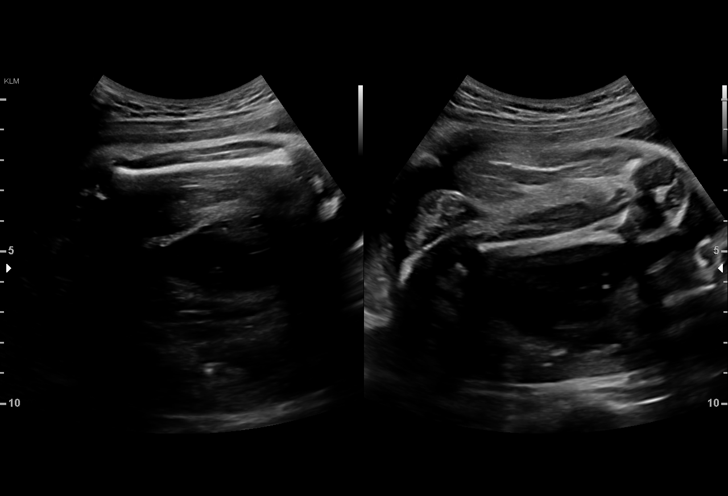
[im 61/75]
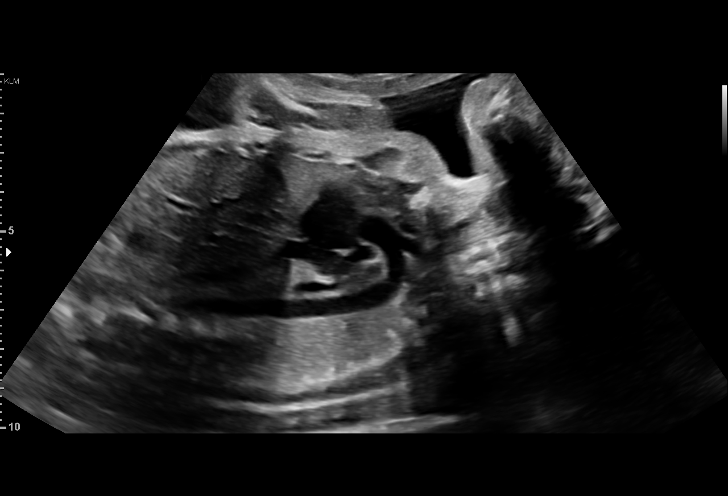
[im 66/75]
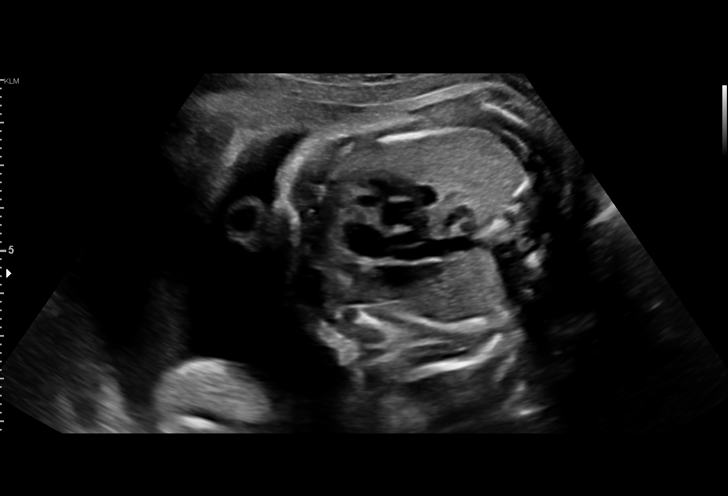
[im 72/75]
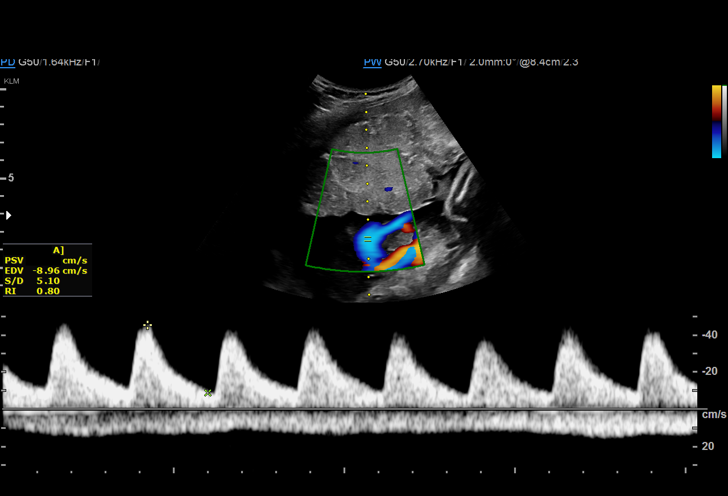

[13 of 28 positions shown; findings below may reference images not displayed]

[REDACTED]. [HOSPITAL],

1  US OB DETAIL + 14 WK                 76811.0      [facility]
3  US MFM UA CORD DOPPLER               76820.02     [facility]

Indications

Encounter for antenatal screening for
malformations
30 weeks gestation of pregnancy
Late to prenatal care, third trimester
Cocaine abuse
Substance abuse affecting pregnancy,
antepartum
Poor obstetric history: Previous preterm
delivery, antepartum (36 weeks)
Fetal Evaluation

Num Of Fetuses:         1
Fetal Heart Rate(bpm):  137
Cardiac Activity:       Observed
Presentation:           Cephalic
Placenta:               Fundal
P. Cord Insertion:      Visualized, central

Amniotic Fluid
AFI FV:      Within normal limits

AFI Sum(cm)     %Tile       Largest Pocket(cm)
11.75           29

RUQ(cm)       RLQ(cm)       LUQ(cm)        LLQ(cm)
2.33
Biophysical Evaluation

Amniotic F.V:   Within normal limits       F. Tone:        Observed
F. Movement:    Observed                   Score:          [DATE]
F. Breathing:   Observed
Biometry

BPD:      75.2  mm     G. Age:  30w 1d         19  %    CI:        76.01   %    70 - 86
FL/HC:      22.0   %    19.3 -
HC:      273.4  mm     G. Age:  29w 6d          4  %    HC/AC:      1.11        0.96 -
AC:      247.4  mm     G. Age:  29w 0d          6  %    FL/BPD:     79.9   %    71 - 87
FL:       60.1  mm     G. Age:  31w 2d         48  %    FL/AC:      24.3   %    20 - 24
HUM:      54.7  mm     G. Age:  31w 6d         72  %

Est. FW:    7897  gm      3 lb 4 oz     35  %
OB History

Gravidity:    4         Prem:   1         SAB:   2
Living:       1
Gestational Age

LMP:           28w 2d        Date:  03/20/17                 EDD:   12/25/17
U/S Today:     30w 1d                                        EDD:   12/12/17
Best:          30w 6d     Det. By:  Early Ultrasound         EDD:   12/07/17
Anatomy

Cranium:               Appears normal         Aortic Arch:            Appears normal
Cavum:                 Not well visualized    Ductal Arch:            Appears normal
Ventricles:            Appears normal         Diaphragm:              Appears normal
Choroid Plexus:        Appears normal         Stomach:                Appears normal, left
sided
Cerebellum:            Appears normal         Abdomen:                Appears normal
Posterior Fossa:       Appears normal         Abdominal Wall:         Not well visualized
Nuchal Fold:           Not applicable (>20    Cord Vessels:           Not well visualized
wks GA)
Face:                  Orbits nl; profile not Kidneys:                Appear normal
well visualized
Lips:                  Appears normal         Bladder:                Appears normal
Thoracic:              Appears normal         Spine:                  Appears normal
Heart:                 Appears normal         Upper Extremities:      Appears normal
(4CH, axis, and
situs)
RVOT:                  Appears normal         Lower Extremities:      Appears normal
LVOT:                  Appears normal

Other:  Fetus appears to be a male. Hands not well seen. Heels visualized.
Technically difficult due to advanced gestational age.
Doppler - Fetal Vessels

Umbilical Artery
S/D     %tile                                            ADFV    RDFV
3.35       75                                                No      No
Cervix Uterus Adnexa

Cervix
Not visualized (advanced GA >24wks)
Comments

Comments:

U/S images were reviewed.  Findings were reviewed with
patient.  Appropriate fetal growth is noted although HC and
AC are less than 10%.  No fetal abnormalities were identified.
No evidence of fetal compromise is found on BPP today.
Normal UA dopplers are interrogated.
Questions answered
15 minutes spent face to face with patient.
Recommendations: 1) Serial U/S every 3 weeks for fetal
growth 2) Weekly BPP and UA dopplers 3) Consider MFM
consultation
Recommendations

1) Serial U/S every 3 weeks for fetal growth 2) Weekly BPP
and UA dopplers 3) Consider MFM consultation

## 2019-10-22 ENCOUNTER — Other Ambulatory Visit: Payer: Self-pay

## 2019-10-22 ENCOUNTER — Emergency Department (HOSPITAL_BASED_OUTPATIENT_CLINIC_OR_DEPARTMENT_OTHER)
Admission: EM | Admit: 2019-10-22 | Discharge: 2019-10-23 | Disposition: A | Discharge status: Home / Self Care | Payer: Medicaid Other | Attending: Emergency Medicine | Admitting: Emergency Medicine

## 2019-10-22 ENCOUNTER — Encounter (HOSPITAL_BASED_OUTPATIENT_CLINIC_OR_DEPARTMENT_OTHER): Payer: Self-pay | Admitting: Emergency Medicine

## 2019-10-22 DIAGNOSIS — T50901A Poisoning by unspecified drugs, medicaments and biological substances, accidental (unintentional), initial encounter: Secondary | ICD-10-CM | POA: Insufficient documentation

## 2019-10-22 DIAGNOSIS — Z79899 Other long term (current) drug therapy: Secondary | ICD-10-CM | POA: Diagnosis not present

## 2019-10-22 NOTE — ED Triage Notes (Signed)
Patient presents via EMS with complaints of overdose; per EMS [place] woods unresponsive; patient bagged per EMS and given 1.5mg  Narcan IM; patient awake with spontaneous resp upon arrival to ED with complaints of nausea. Patient states she thought she was using cocaine and thinks it may have been heroine.

## 2019-10-22 NOTE — ED Provider Notes (Signed)
[facility] EMERGENCY DEPARTMENT Provider Note   CSN: [id number] Arrival date & time: 10/22/19  2243     History Chief Complaint  Patient presents with  . Drug Overdose    Cassidy Underwood [facility] is a 24 y.o. female.  The history is provided by the patient.  Drug Overdose This is a new problem. The current episode started less than 1 hour ago. The problem occurs constantly. The problem has been resolved. Pertinent negatives include no chest pain, no abdominal pain, no headaches and no shortness of breath. Nothing aggravates the symptoms. Nothing relieves the symptoms. Treatments tried: narcan. The treatment provided significant relief.  Patient thought she smoked crack to get high but was found unresponsive with pinpoint pupils and given narcan and now is awake and alert.  No SI or HI.       Past Medical History:  Diagnosis Date  . Abscess   . Cocaine abuse (HCC)   . Medical history non-contributory   . Substance abuse [facility])     Patient Active Problem List   Diagnosis Date Noted  . [redacted] weeks gestation of pregnancy 12/17/2018  . Normal labor 12/16/2018  . Substance induced mood disorder (HCC)   . Cocaine use disorder, severe, dependence (HCC) 02/22/2018  . Major depressive disorder, single episode, severe without psychotic features (HCC) 02/22/2018  . Major depressive disorder, single episode, severe without psychosis (HCC) 02/22/2018  . Indication for care in labor or delivery 11/13/2017  . Pregnancy with adoption planned 10/05/2017  . IUGR (intrauterine growth restriction) affecting care of mother 10/05/2017  . Supervision of high risk pregnancy, antepartum 08/02/2017  . Substance abuse affecting pregnancy, antepartum 07/29/2017  . Homeless 11/02/2016    Past Surgical History:  Procedure Laterality Date  . DILATION AND CURETTAGE OF UTERUS    . DILATION AND EVACUATION N/A 11/09/2014   Procedure: DILATATION AND EVACUATION;  Surgeon: Levie Heritage, DO;  Location:  [facility];  Service: Gynecology;  Laterality: N/A;     OB History    Gravida  5   Para  3   Term  2   Preterm  1   AB  2   Living  3     SAB  1   TAB      Ectopic  0   Multiple  0   Live Births  3           Family History  Adopted: Yes  Family history unknown: Yes    Social History   Tobacco Use  . Smoking status: Never Smoker  . Smokeless tobacco: Never Used  Substance Use Topics  . Alcohol use: Yes    Alcohol/week: 2.0 standard drinks    Types: 2 Cans of beer per week    Comment: 12/15/2018  . Drug use: Yes    Types: Marijuana, Cocaine    Comment: last cocaine and weed last use 12/15/2018    Home Medications Prior to Admission medications   Medication Sig Start Date End Date Taking? Authorizing Provider  acetaminophen (TYLENOL) 160 MG/5ML solution Take 20.3 mLs (650 mg total) by mouth every 6 (six) hours as needed (pain scale < 4). 12/17/18   Fair, Hoyle Sauer, MD  diphenhydrAMINE (BENADRYL) 25 mg capsule Take 1 capsule (25 mg total) by mouth every 6 (six) hours as needed for itching. 12/17/18   Joselyn Arrow, MD  ferrous sulfate 325 (65 FE) MG tablet Take 1 tablet (325 mg total) by mouth daily with breakfast. 12/17/18  Joselyn Arrow, MD  ibuprofen (ADVIL) 100 MG/5ML suspension Take 30 mLs (600 mg total) by mouth every 8 (eight) hours as needed. 12/17/18   Fair, Hoyle Sauer, MD  Prenatal Vit-Fe Fumarate-FA (PREPLUS) 27-1 MG TABS Take 1 tablet by mouth daily. 12/17/18   Fair, Hoyle Sauer, MD  senna-docusate (SENOKOT-S) 8.6-50 MG tablet Take 2 tablets by mouth daily. 12/17/18   Fair, Hoyle Sauer, MD  simethicone (MYLICON) 80 MG chewable tablet Chew 1 tablet (80 mg total) by mouth as needed for flatulence. 12/17/18   FairHoyle Sauer, MD  witch hazel-glycerin (TUCKS) pad Apply 1 application topically as needed for hemorrhoids. 12/17/18   FairHoyle Sauer, MD    Allergies    Patient has no known allergies.  Review of Systems   Review of Systems  Constitutional:  Negative for fever.  HENT: Negative for congestion.   Eyes: Negative for visual disturbance.  Respiratory: Negative for shortness of breath.   Cardiovascular: Negative for chest pain.  Gastrointestinal: Negative for abdominal pain.  Genitourinary: Negative for difficulty urinating.  Skin: Negative for rash.  Neurological: Negative for headaches.  Psychiatric/Behavioral: Negative for self-injury and suicidal ideas.  All other systems reviewed and are negative.   Physical Exam Updated Vital Signs BP 120/89   Pulse 88   Temp 98.2 F (36.8 C) (Oral)   Resp 17   Ht 4\' 10"  (1.473 m)   Wt 44.9 kg   LMP 10/22/2019   SpO2 100%   BMI 20.69 kg/m   Physical Exam Vitals and nursing note reviewed.  Constitutional:      General: She is not in acute distress.    Appearance: Normal appearance.  HENT:     Head: Normocephalic and atraumatic.     Nose: Nose normal.  Eyes:     Conjunctiva/sclera: Conjunctivae normal.     Pupils: Pupils are equal, round, and reactive to light.  Cardiovascular:     Rate and Rhythm: Normal rate and regular rhythm.     Pulses: Normal pulses.     Heart sounds: Normal heart sounds.  Pulmonary:     Effort: Pulmonary effort is normal.     Breath sounds: Normal breath sounds.  Abdominal:     General: Abdomen is flat. Bowel sounds are normal.     Palpations: Abdomen is soft.     Tenderness: There is no abdominal tenderness. There is no guarding or rebound.  Musculoskeletal:        General: Normal range of motion.     Cervical back: Normal range of motion and neck supple.  Skin:    General: Skin is warm and dry.     Capillary Refill: Capillary refill takes less than 2 seconds.  Neurological:     General: No focal deficit present.     Mental Status: She is alert and oriented to person, place, and time.     Deep Tendon Reflexes: Reflexes normal.  Psychiatric:        Mood and Affect: Mood normal.        Behavior: Behavior normal.     ED Results /  Procedures / Treatments   Labs (all labs ordered are listed, but only abnormal results are displayed) Labs Reviewed - No data to display  EKG None  Radiology No results found.  Procedures Procedures (including critical care time)  Medications Ordered in ED Medications - No data to display  ED Course  I have reviewed the triage vital signs and the nursing notes.  Pertinent  labs & imaging results that were available during my care of the patient were reviewed by me and considered in my medical decision making (see chart for details).    Accidental OD, no signs of trauma.  Awake and alert.  Observed in the ED.  PO challenged successfully.  Stable for discharge.    Cassidy Underwood [facility] was evaluated in Emergency Department on 10/22/2019 for the symptoms described in the history of present illness. She was evaluated in the context of the global COVID-19 pandemic, which necessitated consideration that the patient might be at risk for infection with the SARS-CoV-2 virus that causes COVID-19. Institutional protocols and algorithms that pertain to the evaluation of patients at risk for COVID-19 are in a state of rapid change based on information released by regulatory bodies including the CDC and federal and state organizations. These policies and algorithms were followed during the patient's care in the ED.  Final Clinical Impression(s) / ED Diagnoses Return for intractable cough, coughing up blood,fevers >100.4 unrelieved by medication, shortness of breath, intractable vomiting, chest pain, shortness of breath, weakness,numbness, changes in speech, facial asymmetry,abdominal pain, passing out,Inability to tolerate liquids or food, cough, altered mental status or any concerns. No signs of systemic illness or infection. The patient is nontoxic-appearing on exam and vital signs are within normal limits.   I have reviewed the triage vital signs and the nursing notes. Pertinent labs &imaging  results that were available during my care of the patient were reviewed by me and considered in my medical decision making (see chart for details).After history, exam, and medical workup I feel the patient has beenappropriately medically screened and is safe for discharge home. Pertinent diagnoses were discussed with the patient. Patient was given return precautions.      Jamarkis Branam, MD 10/22/19 2329

## 2019-10-23 MED ORDER — ONDANSETRON 4 MG PO TBDP
4.0000 mg | ORAL_TABLET | Freq: Once | ORAL | Status: AC
  Administered 2019-10-23: 4 mg via ORAL
  Filled 2019-10-23: qty 1

## 2020-06-18 ENCOUNTER — Other Ambulatory Visit: Payer: Self-pay

## 2020-06-18 ENCOUNTER — Emergency Department (HOSPITAL_BASED_OUTPATIENT_CLINIC_OR_DEPARTMENT_OTHER)
Admission: EM | Admit: 2020-06-18 | Discharge: 2020-06-18 | Disposition: A | Discharge status: Home / Self Care | Payer: Medicaid Other | Attending: Emergency Medicine | Admitting: Emergency Medicine

## 2020-06-18 ENCOUNTER — Other Ambulatory Visit (HOSPITAL_BASED_OUTPATIENT_CLINIC_OR_DEPARTMENT_OTHER): Payer: Self-pay

## 2020-06-18 ENCOUNTER — Encounter (HOSPITAL_BASED_OUTPATIENT_CLINIC_OR_DEPARTMENT_OTHER): Payer: Self-pay | Admitting: *Deleted

## 2020-06-18 DIAGNOSIS — L0201 Cutaneous abscess of face: Secondary | ICD-10-CM

## 2020-06-18 DIAGNOSIS — R220 Localized swelling, mass and lump, head: Secondary | ICD-10-CM | POA: Diagnosis present

## 2020-06-18 MED ORDER — LIDOCAINE-EPINEPHRINE 1 %-1:100000 IJ SOLN
10.0000 mL | Freq: Once | INTRAMUSCULAR | Status: DC
  Filled 2020-06-18: qty 1

## 2020-06-18 MED ORDER — CLINDAMYCIN HCL 150 MG PO CAPS
ORAL_CAPSULE | ORAL | 0 refills | Status: DC
  Filled 2020-06-18: qty 42, 7d supply, fill #0

## 2020-06-18 NOTE — ED Triage Notes (Signed)
C/o abscess to left side of face x 3 days

## 2020-06-18 NOTE — ED Provider Notes (Signed)
[facility] EMERGENCY DEPARTMENT Provider Note   CSN: [id number] Arrival date & time: 06/18/20  1620     History Chief Complaint  Patient presents with  . Abscess    face    Cassidy Underwood is a 25 y.o. female.  25 year old female with past medical history below including substance abuse and previous abscesses who presents with infection on face.  For the past week, patient has had progressively worsening swelling and pain on her left cheek.  She has longstanding history of acne and has developed facial abscesses in the past from the acne.  A long time ago she had an abscess that required incision and drainage.  Today her facial swelling worsened.  He has been trying to drain it herself without success.  No medications prior to arrival.  No fevers.  The history is provided by the patient.  Abscess Associated symptoms: no fever        Past Medical History:  Diagnosis Date  . Abscess   . Cocaine abuse (HCC)   . Medical history non-contributory   . Substance abuse [facility])     Patient Active Problem List   Diagnosis Date Noted  . [redacted] weeks gestation of pregnancy 12/17/2018  . Normal labor 12/16/2018  . Substance induced mood disorder (HCC)   . Cocaine use disorder, severe, dependence (HCC) 02/22/2018  . Major depressive disorder, single episode, severe without psychotic features (HCC) 02/22/2018  . Major depressive disorder, single episode, severe without psychosis (HCC) 02/22/2018  . Indication for care in labor or delivery 11/13/2017  . Pregnancy with adoption planned 10/05/2017  . IUGR (intrauterine growth restriction) affecting care of mother 10/05/2017  . Supervision of high risk pregnancy, antepartum 08/02/2017  . Substance abuse affecting pregnancy, antepartum 07/29/2017  . Homeless 11/02/2016    Past Surgical History:  Procedure Laterality Date  . DILATION AND CURETTAGE OF UTERUS    . DILATION AND EVACUATION N/A 11/09/2014   Procedure: DILATATION AND  EVACUATION;  Surgeon: Levie Heritage, DO;  Location: [facility];  Service: Gynecology;  Laterality: N/A;     OB History    Gravida  5   Para  3   Term  2   Preterm  1   AB  2   Living  3     SAB  1   IAB      Ectopic  0   Multiple  0   Live Births  3           Family History  Adopted: Yes  Family history unknown: Yes    Social History   Tobacco Use  . Smoking status: Never Smoker  . Smokeless tobacco: Never Used  Substance Use Topics  . Alcohol use: Yes    Alcohol/week: 2.0 standard drinks    Types: 2 Cans of beer per week    Comment: 12/15/2018  . Drug use: Yes    Types: Marijuana, Cocaine    Comment: last cocaine and weed last use 12/15/2018    Home Medications Prior to Admission medications   Medication Sig Start Date End Date Taking? Authorizing Provider  clindamycin (CLEOCIN) 150 MG capsule You may open capsules and sprinkle over food, take by mouth 2 capsules (300mg ) three times daily for 7 days. 06/18/20  Yes Candace Begue, 08/18/20, MD  acetaminophen (TYLENOL) 160 MG/5ML solution Take 20.3 mLs (650 mg total) by mouth every 6 (six) hours as needed (pain scale < 4). 12/17/18   13/3/20, MD  diphenhydrAMINE (BENADRYL) 25 mg capsule Take 1 capsule (25 mg total) by mouth every 6 (six) hours as needed for itching. 12/17/18   FairHoyle Sauer, MD  ferrous sulfate 325 (65 FE) MG tablet Take 1 tablet (325 mg total) by mouth daily with breakfast. 12/17/18   Fair, Hoyle Sauer, MD  ibuprofen (ADVIL) 100 MG/5ML suspension Take 30 mLs (600 mg total) by mouth every 8 (eight) hours as needed. 12/17/18   Fair, Hoyle Sauer, MD  Prenatal Vit-Fe Fumarate-FA (PREPLUS) 27-1 MG TABS Take 1 tablet by mouth daily. 12/17/18   Fair, Hoyle Sauer, MD  senna-docusate (SENOKOT-S) 8.6-50 MG tablet Take 2 tablets by mouth daily. 12/17/18   Fair, Hoyle Sauer, MD  simethicone (MYLICON) 80 MG chewable tablet Chew 1 tablet (80 mg total) by mouth as needed for flatulence. 12/17/18   FairHoyle Sauer, MD   witch hazel-glycerin (TUCKS) pad Apply 1 application topically as needed for hemorrhoids. 12/17/18   FairHoyle Sauer, MD    Allergies    Patient has no known allergies.  Review of Systems   Review of Systems  Constitutional: Negative for chills and fever.  HENT: Negative for dental problem.   Skin: Positive for color change and wound.  Neurological: Negative for numbness.    Physical Exam Updated Vital Signs BP 130/73 (BP Location: Right Arm)   Pulse 92   Temp 98.1 F (36.7 C) (Oral)   Resp 16   Ht 4\' 9"  (1.448 m)   Wt 46.3 kg   SpO2 93%   BMI 22.07 kg/m   Physical Exam Vitals and nursing note reviewed.  Constitutional:      General: She is not in acute distress.    Appearance: She is well-developed.  HENT:     Head: Normocephalic and atraumatic.     Mouth/Throat:     Mouth: Mucous membranes are moist.     Pharynx: Oropharynx is clear.     Comments: No obvious tooth problems or decay in L upper or lower teeth Eyes:     Conjunctiva/sclera: Conjunctivae normal.  Musculoskeletal:     Cervical back: Neck supple.  Skin:    General: Skin is warm and dry.     Comments: Central scab w/ surrounding erythema, induration, and tenderness on L cheek; multiple scars from acne and several other small pustules scattered on face  Neurological:     Mental Status: She is alert and oriented to person, place, and time.  Psychiatric:        Judgment: Judgment normal.     ED Results / Procedures / Treatments   Labs (all labs ordered are listed, but only abnormal results are displayed) Labs Reviewed - No data to display  EKG None  Radiology No results found.  Procedures . Incision and Drainage  Date/Time: 06/18/2020 5:35 PM Performed by: 08/18/2020, MD Authorized by: Laurence Spates, MD   Consent:    Consent obtained:  Verbal   Consent given by:  Patient   Risks discussed:  Bleeding and pain (scar)   Alternatives discussed:  No treatment Universal  protocol:    Patient identity confirmed:  Verbally with patient Location:    Type:  Abscess   Location:  Head   Head location:  Face Pre-procedure details:    Skin preparation:  Povidone-iodine Anesthesia:    Anesthesia method:  Local infiltration   Local anesthetic:  Lidocaine 1% WITH epi Procedure type:    Complexity:  Simple Procedure details:    Incision types:  Stab incision   Incision depth:  Dermal   Drainage:  Bloody and purulent   Drainage amount:  Scant   Wound treatment:  Wound left open   Packing materials:  None Post-procedure details:    Procedure completion:  Tolerated well, no immediate complications     Medications Ordered in ED Medications  lidocaine-EPINEPHrine (XYLOCAINE W/EPI) 1 %-1:100000 (with pres) injection 10 mL (has no administration in time range)    ED Course  I have reviewed the triage vital signs and the nursing notes.      MDM Rules/Calculators/A&P                          Small amount of fluid on bedside US suggestive of abscess w/ surrounding cellulitis. I&D at bedside. Will start on clinda given cellulitic changes. Discussed wound care including antibacterial soap, bacitracin, warm compresses.  Reviewed return precautions regarding signs of worsening infection.  She voiced understanding. Final Clinical Impression(s) / ED Diagnoses Final diagnoses:  Abscess of face    Rx / DC Orders ED Discharge Orders         Ordered    clindamycin (CLEOCIN) 150 MG capsule        06/18/20 1730           Anwar Crill, Ambrose Finland, MD 06/18/20 1736

## 2020-10-23 ENCOUNTER — Encounter (HOSPITAL_COMMUNITY): Payer: Self-pay

## 2020-10-23 ENCOUNTER — Other Ambulatory Visit: Payer: Self-pay

## 2020-10-23 ENCOUNTER — Emergency Department (HOSPITAL_COMMUNITY)
Admission: EM | Admit: 2020-10-23 | Discharge: 2020-10-23 | Disposition: A | Discharge status: Home / Self Care | Payer: Medicaid Other | Attending: Emergency Medicine | Admitting: Emergency Medicine

## 2020-10-23 DIAGNOSIS — R110 Nausea: Secondary | ICD-10-CM | POA: Diagnosis not present

## 2020-10-23 DIAGNOSIS — T401X1A Poisoning by heroin, accidental (unintentional), initial encounter: Secondary | ICD-10-CM | POA: Diagnosis present

## 2020-10-23 DIAGNOSIS — R21 Rash and other nonspecific skin eruption: Secondary | ICD-10-CM | POA: Diagnosis not present

## 2020-10-23 DIAGNOSIS — N9489 Other specified conditions associated with female genital organs and menstrual cycle: Secondary | ICD-10-CM | POA: Diagnosis not present

## 2020-10-23 DIAGNOSIS — F1910 Other psychoactive substance abuse, uncomplicated: Secondary | ICD-10-CM

## 2020-10-23 LAB — I-STAT BETA HCG BLOOD, ED (MC, WL, AP ONLY): I-stat hCG, quantitative: <5 m[IU]/mL (ref <5)

## 2020-10-23 MED ORDER — ONDANSETRON 4 MG PO TBDP
4.0000 mg | ORAL_TABLET | Freq: Once | ORAL | Status: AC
  Administered 2020-10-23: 4 mg via ORAL
  Filled 2020-10-23: qty 1

## 2020-10-23 MED ORDER — NALOXONE HCL 0.4 MG/ML IJ SOLN: INTRAMUSCULAR | 0 refills | Status: DC

## 2020-10-23 NOTE — ED Triage Notes (Addendum)
Pt [place] EMS from the streets with c/o accidental heroin overdose. Pt also admits cocaine use today. Per boyfriend pt went apneic. EMS administered 2mg  IN and about 10 minutes later had positive improvement.

## 2020-10-23 NOTE — ED Notes (Addendum)
Pt placed on monitor.  

## 2020-10-23 NOTE — ED Provider Notes (Signed)
[facility]-EMERGENCY DEPT Provider Note   CSN: [id number] Arrival date & time: 10/23/20  0341     History No chief complaint on file.   Cassidy Underwood is a 25 y.o. female presenting after accidental overdose of cocaine and heroin.  Patient states she used heroin and cocaine.  Cannot tell me when.  Does not know exactly what happened.  She states this was accidental, she is not having any suicidal thoughts.  She states she is feeling nauseous and tired now, but has no pain, difficulty breathing.   Additional history obtained from EMS.  EMS states boyfriend called as patient was apneic.  Patient received Narcan and had improvement of symptoms. Per chart review, pt with a h/o polysubstance abuse and accidental overdose.   HPI     Past Medical History:  Diagnosis Date   Abscess    Cocaine abuse (HCC)    Medical history non-contributory    Substance abuse [facility])     Patient Active Problem List   Diagnosis Date Noted   [redacted] weeks gestation of pregnancy 12/17/2018   Normal labor 12/16/2018   Substance induced mood disorder (HCC)    Cocaine use disorder, severe, dependence (HCC) 02/22/2018   Major depressive disorder, single episode, severe without psychotic features (HCC) 02/22/2018   Major depressive disorder, single episode, severe without psychosis (HCC) 02/22/2018   Indication for care in labor or delivery 11/13/2017   Pregnancy with adoption planned 10/05/2017   IUGR (intrauterine growth restriction) affecting care of mother 10/05/2017   Supervision of high risk pregnancy, antepartum 08/02/2017   Substance abuse affecting pregnancy, antepartum 07/29/2017   Homeless 11/02/2016    Past Surgical History:  Procedure Laterality Date   DILATION AND CURETTAGE OF UTERUS     DILATION AND EVACUATION N/A 11/09/2014   Procedure: DILATATION AND EVACUATION;  Surgeon: Levie Heritage, DO;  Location: [facility];  Service: Gynecology;  Laterality: N/A;     OB  History     Gravida  5   Para  3   Term  2   Preterm  1   AB  2   Living  3      SAB  1   IAB      Ectopic  0   Multiple  0   Live Births  3           Family History  Adopted: Yes  Family history unknown: Yes    Social History   Tobacco Use   Smoking status: Never   Smokeless tobacco: Never  Substance Use Topics   Alcohol use: Yes    Alcohol/week: 2.0 standard drinks    Types: 2 Cans of beer per week    Comment: 12/15/2018   Drug use: Yes    Types: Marijuana, Cocaine    Comment: last cocaine and weed last use 12/15/2018    Home Medications Prior to Admission medications   Medication Sig Start Date End Date Taking? Authorizing Provider  acetaminophen (TYLENOL) 160 MG/5ML solution Take 20.3 mLs (650 mg total) by mouth every 6 (six) hours as needed (pain scale < 4). 12/17/18   Fair, Hoyle Sauer, MD  clindamycin (CLEOCIN) 150 MG capsule TAKE 2 CAPSULES BY MOUTH 3 TIMES DAILY FOR 7 DAYS. **MAY OPEN CAPSULE AND SPRINKLE OVER FOOD** 06/18/20   Little, Ambrose Finland, MD  diphenhydrAMINE (BENADRYL) 25 mg capsule Take 1 capsule (25 mg total) by mouth every 6 (six) hours as needed for itching. 12/17/18   Fair,  Hoyle Sauer, MD  ferrous sulfate 325 (65 FE) MG tablet Take 1 tablet (325 mg total) by mouth daily with breakfast. 12/17/18   Fair, Hoyle Sauer, MD  ibuprofen (ADVIL) 100 MG/5ML suspension Take 30 mLs (600 mg total) by mouth every 8 (eight) hours as needed. 12/17/18   Fair, Hoyle Sauer, MD  Prenatal Vit-Fe Fumarate-FA (PREPLUS) 27-1 MG TABS Take 1 tablet by mouth daily. 12/17/18   Fair, Hoyle Sauer, MD  senna-docusate (SENOKOT-S) 8.6-50 MG tablet Take 2 tablets by mouth daily. 12/17/18   Fair, Hoyle Sauer, MD  simethicone (MYLICON) 80 MG chewable tablet Chew 1 tablet (80 mg total) by mouth as needed for flatulence. 12/17/18   FairHoyle Sauer, MD  witch hazel-glycerin (TUCKS) pad Apply 1 application topically as needed for hemorrhoids. 12/17/18   FairHoyle Sauer, MD    Allergies     Patient has no known allergies.  Review of Systems   Review of Systems  Gastrointestinal:  Positive for nausea.  All other systems reviewed and are negative.  Physical Exam Updated Vital Signs There were no vitals taken for this visit.  Physical Exam Vitals and nursing note reviewed.  Constitutional:      General: She is not in acute distress.    Appearance: Normal appearance.     Comments: Drowsy, but able to answer questions  HENT:     Head: Normocephalic and atraumatic.  Eyes:     Conjunctiva/sclera: Conjunctivae normal.     Pupils: Pupils are equal, round, and reactive to light.  Cardiovascular:     Rate and Rhythm: Normal rate and regular rhythm.     Pulses: Normal pulses.  Pulmonary:     Effort: Pulmonary effort is normal. No respiratory distress.     Breath sounds: Normal breath sounds. No wheezing.     Comments: Speaking in full sentences.  Clear lung sounds in all fields. SpO2 99% on RA Abdominal:     General: There is no distension.     Palpations: Abdomen is soft. There is no mass.     Tenderness: There is no abdominal tenderness. There is no guarding or rebound.  Musculoskeletal:        General: Normal range of motion.     Cervical back: Normal range of motion and neck supple.  Skin:    General: Skin is warm and dry.     Capillary Refill: Capillary refill takes less than 2 seconds.     Findings: Rash present.     Comments: Chronic appearing lesions noted on legs  Neurological:     Mental Status: She is oriented to person, place, and time.     Comments: drowsy  Psychiatric:        Mood and Affect: Mood and affect normal.        Speech: Speech normal.        Behavior: Behavior normal.    ED Results / Procedures / Treatments   Labs (all labs ordered are listed, but only abnormal results are displayed) Labs Reviewed - No data to display  EKG None  Radiology No results found.  Procedures Procedures   Medications Ordered in ED Medications - No  data to display  ED Course  I have reviewed the triage vital signs and the nursing notes.  Pertinent labs & imaging results that were available during my care of the patient were reviewed by me and considered in my medical decision making (see chart for details).    MDM Rules/Calculators/A&P  Patient presenting for evaluation after accidental heroin overdose.  On exam, patient is drowsy, however able to answer questions appropriately.  Received Narcan with EMS.  Will observe her to ensure no recurrent apnea.  As patient does not report suicidal thoughts and states this was an accident, I do not believe she needs emergent psychiatric evaluation.  On reevaluation, patient is less drowsy than before. States nausea is improved, tolerating PO. Will plan for d/c.  At this time, patient appears safe for discharge.  Return precautions given.  Patient states she understands and agrees to plan.  Final Clinical Impression(s) / ED Diagnoses Final diagnoses:  None    Rx / DC Orders ED Discharge Orders     None        Alveria Apley, PA-C 10/23/20 0545    Palumbo, April, MD 10/23/20 [phone]

## 2020-10-23 NOTE — Discharge Instructions (Signed)
I have given you a Narcan autoinjector that should be used if you have slowed breathing or if you stop breathing after heroin use. Return to the emergency room with any new, worsening, concerning symptoms

## 2021-06-14 ENCOUNTER — Emergency Department (HOSPITAL_COMMUNITY)
Admission: EM | Admit: 2021-06-14 | Discharge: 2021-06-14 | Disposition: A | Discharge status: Home / Self Care | Payer: Medicaid Other | Attending: Emergency Medicine | Admitting: Emergency Medicine

## 2021-06-14 ENCOUNTER — Encounter (HOSPITAL_COMMUNITY): Payer: Self-pay

## 2021-06-14 ENCOUNTER — Other Ambulatory Visit: Payer: Self-pay

## 2021-06-14 DIAGNOSIS — R7309 Other abnormal glucose: Secondary | ICD-10-CM | POA: Diagnosis not present

## 2021-06-14 DIAGNOSIS — T402X1A Poisoning by other opioids, accidental (unintentional), initial encounter: Secondary | ICD-10-CM | POA: Insufficient documentation

## 2021-06-14 DIAGNOSIS — T40601A Poisoning by unspecified narcotics, accidental (unintentional), initial encounter: Secondary | ICD-10-CM

## 2021-06-14 LAB — CBG MONITORING, ED: Glucose-Capillary: 257 mg/dL — ABNORMAL HIGH (ref 70–99)

## 2021-06-14 MED ORDER — ONDANSETRON HCL 4 MG/2ML IJ SOLN
4.0000 mg | Freq: Once | INTRAMUSCULAR | Status: AC
  Administered 2021-06-14: 4 mg via INTRAVENOUS
  Filled 2021-06-14: qty 2

## 2021-06-14 MED ORDER — LACTATED RINGERS IV BOLUS
1000.0000 mL | Freq: Once | INTRAVENOUS | Status: AC
  Administered 2021-06-14: 1000 mL via INTRAVENOUS

## 2021-06-14 NOTE — ED Provider Notes (Signed)
?[facility]-EMERGENCY DEPT ?Provider Note ? ? ?CSN: [id number] ?Arrival date & time: 06/14/21  0147 ? ?  ? ?History ? ?Chief Complaint  ?Patient presents with  ? Drug Overdose  ? ? ?Cassidy Underwood is a 26 y.o. female. ? ?The history is provided by the patient.  ?Drug Overdose ?This is a new problem. The problem occurs constantly. The problem has been gradually improving. Pertinent negatives include no chest pain and no abdominal pain.  ? ?Patient presents after accidental overdose.  Patient reports she stays in a homeless camp.  She reports she thought she was snorting cocaine, but then she passed out and was given Narcan and she woke up.  She suspect that she actually snorted heroin.  She has had nausea and vomiting.  No other acute complaints ? ? ?Home Medications ?Prior to Admission medications   ?Not on File  ?   ? ?Allergies    ?Patient has no known allergies.   ? ?Review of Systems   ?Review of Systems  ?Cardiovascular:  Negative for chest pain.  ?Gastrointestinal:  Positive for nausea and vomiting. Negative for abdominal pain.  ? ?Physical Exam ?Updated Vital Signs ?BP 131/83   Pulse 85   Temp 97.7 ?F (36.5 ?C)   Resp 19   SpO2 100%  ?Physical Exam ?CONSTITUTIONAL: Chronically ill-appearing, appears older than stated age ?HEAD: Normocephalic/atraumatic ?EYES: EOMI/PERRL ?ENMT: Mucous membranes moist ?NECK: supple no meningeal signs ?CV: S1/S2 noted, no murmurs/rubs/gallops noted ?LUNGS: Lungs are clear to auscultation bilaterally, no apparent distress ?ABDOMEN: soft, nontender ?NEURO: Pt is awake/alert/appropriate, moves all extremitiesx4.  No facial droop.   ?EXTREMITIES: full ROM ?SKIN: warm, color normal ?PSYCH: no abnormalities of mood noted, alert and oriented to situation ? ?ED Results / Procedures / Treatments   ?Labs ?(all labs ordered are listed, but only abnormal results are displayed) ?Labs Reviewed  ?CBG MONITORING, ED - Abnormal; Notable for the following components:  ?     Result Value  ? Glucose-Capillary 257 (*)   ? All other components within normal limits  ? ? ?EKG ?EKG Interpretation ? ?Date/Time:  Tuesday Jun 14 2021 02:03:14 EDT ?Ventricular Rate:  84 ?PR Interval:  137 ?QRS Duration: 85 ?QT Interval:  381 ?QTC Calculation: 451 ?R Axis:   79 ?Text Interpretation: Sinus rhythm RSR' in V1 or V2, probably normal variant LVH by voltage No significant change since last tracing Confirmed by Zadie Rhine ([id number]) on 06/14/2021 2:37:27 AM ? ?Radiology ?No results found. ? ?Procedures ?Procedures  ? ? ?Medications Ordered in ED ?Medications  ?ondansetron (ZOFRAN) injection 4 mg (4 mg Intravenous Given 06/14/21 0245)  ?lactated ringers bolus 1,000 mL (1,000 mLs Intravenous New Bag/Given 06/14/21 0246)  ? ? ?ED Course/ Medical Decision Making/ A&P ?Clinical Course as of 06/14/21 0353  ?Tue Jun 14, 2021  ?0351 Patient feels improved.  She is requesting discharge home.  She has been here approximately 2 hours without any deterioration.  She reports her drug of choice is cocaine and does not typically use heroin. [DW]  ?8338 She is requesting outpatient resources.  Patient is awake alert and appropriate for discharge.  Will not provide Narcan as she typically does not use opiates [DW]  ?  ?Clinical Course User Index ?[DW] Zadie Rhine, MD  ? ?                        ?Medical Decision Making ?Amount and/or Complexity of Data Reviewed ?ECG/medicine tests: ordered. ? ?  Risk ?Prescription drug management. ? ? ?Patient presents after accidental overdose. ?Reports she thought she was snorting cocaine but was actually heroin.  Patient is stable at this time, will continue to monitor ?3:53 AM ?Patient improved, will be discharged  ? ? ? ? ? ? ?Final Clinical Impression(s) / ED Diagnoses ?Final diagnoses:  ?Opiate overdose, accidental or unintentional, initial encounter (HCC)  ? ? ?Rx / DC Orders ?ED Discharge Orders   ? ? None  ? ?  ? ? ?  ?Zadie Rhine, MD ?06/14/21 [phone] ? ?

## 2021-06-14 NOTE — ED Triage Notes (Signed)
Pt. [facility]IB [facility] from [facility] [place] for drug overdose. She was found outside. EMS reports that she took heroin in which she doesn't usually use. EMS gave 1mg  Narcan PTA and BVM ventilations for 10 min.  ? ?EMS VS: ?BP: 136/82 ?HR: 84 ?O2: 99% ?

## 2021-06-14 NOTE — Discharge Instructions (Signed)
Substance Abuse Treatment Programs ° °Intensive Outpatient Programs °[facility]     °[address]      °[place], [place]                   °[phone]      ° °[facility] °[address] °[place], [place] °[phone] ° °[facility]     °(Inpatient and outpatient)     °[address]           °[phone]   ° °[facility] °[phone] (Suboxone and Methadone) ° °[address]      °[place], [place] [address]      °[phone]      ° °[address] °[place], [place] °[phone] ° °[facility] (Outpatient/Inpatient, Chemical)    °(insurance only) [phone]      °       °[facility] (Groups & Residential) °[place], [place] °[phone] ° °   °[facility]     °[address]     °[place], [place]      °[phone]      ° °[facility] (for caregivers and family) °[address] °[place], [place] °336-299-4655 ° ° ° ° ° °Residential Treatment Programs °[facility]      °[address], [place], [place] [address]  °[phone]      ° °[facility] °[address] [place], [place] [address] °[phone] ° °[facility]        °[phone]      ° °[facility] °[phone] ° °[facility]             °[address]                                         °[place], [place]                                                °[phone] or 336-784-9470                              ° °[facility] [place] °[address] °[place], [address] °1.877.941.8954 ° °[facility]    °[address]      °[place], [place]     °[phone]      ° °[facility] °[address] °[place], [place] °336-285-9073 ° °[facility]   °[address]     °[place], [place] [address]     °[phone]      °Admissions: 8am-3pm M-F ° °[facility] °[address] °[place],  [place] °336-227-7417 ° °[facility]: Residential Program (90 Days)   °[place], [place]      °336-725-8389 or 800-758-6077    ° °[facility] °[place], [place] °(Walk in Hours over the weekend or by referral) ° °[facility] °[address], [place], [place] [address] °([phone] ° °Crisis Mobile: [facility]:  1-877-626-1772 (for crisis response 24 hours a day) °[facility] Hotline:      1-800-256-2452 °Outpatient Psychiatry and Counseling ° °[facility]: Mobile Crisis   Management 24 hours:  1-877-626-1772 ° °[facility] sliding scale fee and walk in schedule: M-F 8am-12pm/1pm-3pm °[address]  °[place], [place] [address] °[phone] ° °[facility] °[address] °[place], [place] [address] °[phone] ° °[facility] (Formerly known [facility])- new patient walk-in appointments available Monday - Friday 8am -3pm.          °[address] °[place], [place] [address] °336-676-6840 or crisis line- 336-676-6905 ° °[facility]/ Intensive Outpatient Therapy Program °[address] °[place], [place] [address] °336-832-9804 ° °[facility]                  °Crisis Services      °[phone]      °[address]     °[place], [place] [address]                ° °[facility]   °[facility] °[phone] °[address] °[place], [place] [address] ° ° °[facility]          °[address],  °[place], [place] [address]       °[phone] ° °[facility] °[address] °[place], [place] [address] °336-292-1510 ° °[facility]    °[address]    °[place], [place] [address]     °[phone]      ° °Parish McKinney, MD     °[address]     °[place] [address]     °336-282-1251     °  °[facility] °[address] °[place] [address] ° °[facility]     °[address]     °[place], [place]      °336-542-2076      ° °[facility] °Shamsher Ahluwalia, MD °[address] °[place], [place] [address] °[phone] ° °[facility]     °[address]     °[place], [place] [address]     °336-274-1237      ° °[facility] °[address] °[place], [place] [address] °336-854-4450 °Resources for Temporary Residential Assistance/Crisis Centers ° °DAY CENTERS °[facility] °M-F 8am-3pm   °[address] [place], [place] [address]   336-332-0824 °Services include: laundry, barbering, support groups, case management, phone  & computer access, showers, AA/NA mtgs, mental health/substance abuse nurse, job skills class, disability information, VA assistance, spiritual classes, etc.  ° °HOMELESS SHELTERS ° °[facility]     °[facility]   °[address], [place]     °[phone]       °       °[facility] (women and children)       °[address] °[place], [place] [address] °336-275-0820 °[email or web address] for application and process °Application Required ° °[facility]   °[address]    °[place] [address]     °[phone]       °             °[facility] °[address] °[place], [place] [address] °336.273.5572 °336-235-0363(schedule application appt.) °Application Required ° °[facility] (women only)    °[address]     °[place],  [address]     °336-884-1039      °  Intake starts 6pm daily °Need valid ID, SSC, & Police report °[facility] [place] °[address] °[place], [place] °336-881-5420 °Application Required ° °[facility] (men only)     °[address]      °[place], [place]     °[phone]      ° °[facility] °(Pregnant women only) °[address] °[place], [place] °336-275-0206 ° °[facility]      °[address]      °[place], [place] [address]     °[phone]      °       °[facility] °[address] °[place], [place] °336-723-1848 °90 day commitment/SA/Application process ° °[facility](men only)     °[address]     °[place], [place]     °[phone]       °Check-in at 7pm     °       °[facility] °[address] °[place], [place] [address] °336-248-6684 °Men/Women/Women and Children must be there by 7 pm ° °[facility] °[place], [place] °336-722-8721                ° °

## 2021-06-25 ENCOUNTER — Inpatient Hospital Stay (HOSPITAL_COMMUNITY)
Admission: EM | Admit: 2021-06-25 | Discharge: 2021-06-29 | DRG: 917 | Discharge status: Against Medical Advice | Payer: Medicaid Other | Attending: Internal Medicine | Admitting: Internal Medicine

## 2021-06-25 ENCOUNTER — Emergency Department (HOSPITAL_COMMUNITY): Payer: Medicaid Other

## 2021-06-25 ENCOUNTER — Inpatient Hospital Stay (HOSPITAL_COMMUNITY): Payer: Medicaid Other

## 2021-06-25 DIAGNOSIS — R739 Hyperglycemia, unspecified: Secondary | ICD-10-CM

## 2021-06-25 DIAGNOSIS — R7401 Elevation of levels of liver transaminase levels: Secondary | ICD-10-CM | POA: Diagnosis present

## 2021-06-25 DIAGNOSIS — D72829 Elevated white blood cell count, unspecified: Secondary | ICD-10-CM | POA: Diagnosis present

## 2021-06-25 DIAGNOSIS — R8281 Pyuria: Secondary | ICD-10-CM | POA: Diagnosis present

## 2021-06-25 DIAGNOSIS — H9190 Unspecified hearing loss, unspecified ear: Secondary | ICD-10-CM | POA: Diagnosis present

## 2021-06-25 DIAGNOSIS — J45909 Unspecified asthma, uncomplicated: Secondary | ICD-10-CM | POA: Diagnosis present

## 2021-06-25 DIAGNOSIS — F10939 Alcohol use, unspecified with withdrawal, unspecified: Secondary | ICD-10-CM | POA: Diagnosis present

## 2021-06-25 DIAGNOSIS — R748 Abnormal levels of other serum enzymes: Secondary | ICD-10-CM | POA: Diagnosis present

## 2021-06-25 DIAGNOSIS — T50901A Poisoning by unspecified drugs, medicaments and biological substances, accidental (unintentional), initial encounter: Secondary | ICD-10-CM | POA: Diagnosis not present

## 2021-06-25 DIAGNOSIS — G928 Other toxic encephalopathy: Secondary | ICD-10-CM | POA: Diagnosis present

## 2021-06-25 DIAGNOSIS — N179 Acute kidney failure, unspecified: Secondary | ICD-10-CM | POA: Diagnosis present

## 2021-06-25 DIAGNOSIS — E876 Hypokalemia: Secondary | ICD-10-CM | POA: Diagnosis present

## 2021-06-25 DIAGNOSIS — R7989 Other specified abnormal findings of blood chemistry: Secondary | ICD-10-CM

## 2021-06-25 DIAGNOSIS — F1110 Opioid abuse, uncomplicated: Secondary | ICD-10-CM | POA: Diagnosis present

## 2021-06-25 DIAGNOSIS — G4710 Hypersomnia, unspecified: Secondary | ICD-10-CM | POA: Diagnosis present

## 2021-06-25 DIAGNOSIS — T401X1A Poisoning by heroin, accidental (unintentional), initial encounter: Secondary | ICD-10-CM | POA: Diagnosis present

## 2021-06-25 DIAGNOSIS — F10930 Alcohol use, unspecified with withdrawal, uncomplicated: Secondary | ICD-10-CM | POA: Diagnosis not present

## 2021-06-25 DIAGNOSIS — F1410 Cocaine abuse, uncomplicated: Secondary | ICD-10-CM | POA: Diagnosis present

## 2021-06-25 DIAGNOSIS — Z5321 Procedure and treatment not carried out due to patient leaving prior to being seen by health care provider: Secondary | ICD-10-CM | POA: Diagnosis present

## 2021-06-25 DIAGNOSIS — E860 Dehydration: Secondary | ICD-10-CM | POA: Diagnosis present

## 2021-06-25 DIAGNOSIS — F1910 Other psychoactive substance abuse, uncomplicated: Secondary | ICD-10-CM

## 2021-06-25 DIAGNOSIS — M6282 Rhabdomyolysis: Secondary | ICD-10-CM | POA: Diagnosis present

## 2021-06-25 HISTORY — DX: Poisoning by unspecified drugs, medicaments and biological substances, accidental (unintentional), initial encounter: T50.901A

## 2021-06-25 LAB — RAPID URINE DRUG SCREEN, HOSP PERFORMED
Amphetamines: NOT DETECTED
Barbiturates: NOT DETECTED
Benzodiazepines: NOT DETECTED
Cocaine: POSITIVE — AB
Opiates: NOT DETECTED
Tetrahydrocannabinol: NOT DETECTED

## 2021-06-25 LAB — COMPREHENSIVE METABOLIC PANEL
ALT: 105 U/L — ABNORMAL HIGH (ref 0–44)
AST: 176 U/L — ABNORMAL HIGH (ref 15–41)
Albumin: 4.5 g/dL (ref 3.5–5.0)
Alkaline Phosphatase: 66 U/L (ref 38–126)
Anion gap: 13 (ref 5–15)
BUN: 29 mg/dL — ABNORMAL HIGH (ref 6–20)
CO2: 22 mmol/L (ref 22–32)
Calcium: 8.9 mg/dL (ref 8.9–10.3)
Chloride: 105 mmol/L (ref 98–111)
Creatinine, Ser: 1.23 mg/dL — ABNORMAL HIGH (ref 0.44–1.00)
GFR, Estimated: >60 mL/min (ref >60)
Glucose, Bld: 148 mg/dL — ABNORMAL HIGH (ref 70–99)
Potassium: 4.3 mmol/L (ref 3.5–5.1)
Sodium: 140 mmol/L (ref 135–145)
Total Bilirubin: 0.7 mg/dL (ref 0.3–1.2)
Total Protein: 8 g/dL (ref 6.5–8.1)

## 2021-06-25 LAB — CBC
HCT: 42.2 % (ref 36.0–46.0)
Hemoglobin: 14.2 g/dL (ref 12.0–15.0)
MCH: 30 pg (ref 26.0–34.0)
MCHC: 33.6 g/dL (ref 30.0–36.0)
MCV: 89.2 fL (ref 80.0–100.0)
Platelets: 360 10*3/uL (ref 150–400)
RBC: 4.73 MIL/uL (ref 3.87–5.11)
RDW: 12.2 % (ref 11.5–15.5)
WBC: 22.4 10*3/uL — ABNORMAL HIGH (ref 4.0–10.5)
nRBC: 0 % (ref 0.0–0.2)

## 2021-06-25 LAB — I-STAT BETA HCG BLOOD, ED (MC, WL, AP ONLY): I-stat hCG, quantitative: <5 m[IU]/mL (ref <5)

## 2021-06-25 LAB — URINALYSIS, ROUTINE W REFLEX MICROSCOPIC
Bilirubin Urine: NEGATIVE
Glucose, UA: 50 mg/dL — AB
Ketones, ur: 20 mg/dL — AB
Leukocytes,Ua: NEGATIVE
Nitrite: NEGATIVE
Protein, ur: 100 mg/dL — AB
Specific Gravity, Urine: 1.015 (ref 1.005–1.030)
pH: 5 (ref 5.0–8.0)

## 2021-06-25 LAB — HEMOGLOBIN A1C
Hgb A1c MFr Bld: 5.4 % (ref 4.8–5.6)
Mean Plasma Glucose: 108.28 mg/dL

## 2021-06-25 LAB — CK: Total CK: 4428 U/L — ABNORMAL HIGH (ref 38–234)

## 2021-06-25 MED ORDER — PROCHLORPERAZINE EDISYLATE 10 MG/2ML IJ SOLN: 10.0000 mg | Freq: Four times a day (QID) | INTRAMUSCULAR | Status: DC | PRN

## 2021-06-25 MED ORDER — SODIUM CHLORIDE 0.9 % IV BOLUS
1000.0000 mL | Freq: Once | INTRAVENOUS | Status: AC
  Administered 2021-06-25: 1000 mL via INTRAVENOUS

## 2021-06-25 MED ORDER — SODIUM CHLORIDE 0.9 % IV SOLN: INTRAVENOUS | Status: DC

## 2021-06-25 MED ORDER — LACTATED RINGERS IV BOLUS
1000.0000 mL | Freq: Once | INTRAVENOUS | Status: AC
  Administered 2021-06-25: 1000 mL via INTRAVENOUS
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
## 2021-06-25 NOTE — ED Notes (Signed)
Patient transported to CT 

## 2021-06-25 NOTE — ED Notes (Signed)
PT provided with lemon lime soda as well as a sandwich and after drinking soda, pt vomited. ?

## 2021-06-25 NOTE — ED Notes (Signed)
Patient ambulatory to restroom and back to room with x1 staff assist. ?

## 2021-06-25 NOTE — ED Triage Notes (Signed)
Pt asking for water but not answering any other questions. Making purposeful movements and making eye contact. ?

## 2021-06-25 NOTE — ED Triage Notes (Signed)
[place] Boyfriend [place] up, found pt nonresponsive, administered narcan and called 911 from hotel [place] st. Pt awake but confused with EMS. 20ga LAC ? ?116/60 ?HR 102 ST ?CBG 137 ?99% RA ?RR 16 ?

## 2021-06-25 NOTE — ED Provider Notes (Signed)
?[facility]-EMERGENCY DEPT ?Provider Note ? ? ?CSN: [id number] ?Arrival date & time: 06/25/21  1116 ? ?  ? ?History ? ?Chief Complaint  ?Patient presents with  ? Drug Overdose  ? ? ?Cassidy Underwood is a 26 y.o. female. ? ? ?Drug Overdose ? ?Patient is a 26 year old female brought in by EMS after she snorted a "pill of heroin" ? ?Patient is currently primarily complaining of severe thirst and not being able to hear ? ?She denies any pain currently.  She states that she feels somewhat fatigued.  She denies any fevers cough congestion or difficulty breathing. ? ?She states that she normally does cocaine but was aware that she was doing heroin this time.  She says she has actually overdosed on heroin in the past.  On my review of the EMR it seems that this is consistent with her prior visits to the ER. ? ?  ? ?Home Medications ?Prior to Admission medications   ?Not on File  ?   ? ?Allergies    ?Patient has no known allergies.   ? ?Review of Systems   ?Review of Systems ? ?Physical Exam ?Updated Vital Signs ?BP 97/65   Pulse 86   Temp 98.3 ?F (36.8 ?C) (Oral)   Resp 17   SpO2 99%  ?Physical Exam ?Vitals and nursing note reviewed.  ?Constitutional:   ?   Comments: Chronically ill and dehydrated appearing.  Appears much older than stated age.  ?HENT:  ?   Head: Normocephalic and atraumatic.  ?   Nose: Nose normal.  ?   Mouth/Throat:  ?   Mouth: Mucous membranes are dry.  ?Eyes:  ?   General: No scleral icterus. ?Cardiovascular:  ?   Rate and Rhythm: Normal rate and regular rhythm.  ?   Pulses: Normal pulses.  ?   Heart sounds: Normal heart sounds.  ?Pulmonary:  ?   Effort: Pulmonary effort is normal. No respiratory distress.  ?   Breath sounds: No wheezing.  ?Abdominal:  ?   Palpations: Abdomen is soft.  ?   Tenderness: There is no abdominal tenderness. There is no guarding or rebound.  ?Musculoskeletal:  ?   Cervical back: Normal range of motion.  ?   Right lower leg: No edema.  ?   Left lower  leg: No edema.  ?Skin: ?   General: Skin is warm and dry.  ?   Capillary Refill: Capillary refill takes less than 2 seconds.  ?Neurological:  ?   Mental Status: She is alert. Mental status is at baseline.  ?   Comments: Alert and oriented to self, place, time and event.  ? ?Speech is fluent, clear without dysarthria or dysphasia.  ? ?Strength 5/5 in upper/lower extremities   ?Sensation intact in upper/lower extremities  ? ?Normal gait.  ?Negative Romberg. No pronator drift.  ?Normal finger-to-nose and feet tapping.  ?CN I not tested  ?CN II grossly intact visual fields bilaterally. Did not visualize posterior eye.  ?CN III, IV, VI PERRLA and EOMs intact bilaterally  ?CN V Intact sensation to sharp and light touch to the face  ?CN VII facial movements symmetric  ?CN VIII not tested  ?CN IX, X no uvula deviation, symmetric rise of soft palate  ?CN XI 5/5 SCM and trapezius strength bilaterally  ?CN XII Midline tongue protrusion, symmetric L/R movements   ?Psychiatric:     ?   Mood and Affect: Mood normal.     ?   Behavior: Behavior  normal.  ? ? ?ED Results / Procedures / Treatments   ?Labs ?(all labs ordered are listed, but only abnormal results are displayed) ?Labs Reviewed  ?RAPID URINE DRUG SCREEN, HOSP PERFORMED - Abnormal; Notable for the following components:  ?    Result Value  ? Cocaine POSITIVE (*)   ? All other components within normal limits  ?CBC - Abnormal; Notable for the following components:  ? WBC 22.4 (*)   ? All other components within normal limits  ?COMPREHENSIVE METABOLIC PANEL - Abnormal; Notable for the following components:  ? Glucose, Bld 148 (*)   ? BUN 29 (*)   ? Creatinine, Ser 1.23 (*)   ? AST 176 (*)   ? ALT 105 (*)   ? All other components within normal limits  ?CK - Abnormal; Notable for the following components:  ? Total CK 4,428 (*)   ? All other components within normal limits  ?URINALYSIS, ROUTINE W REFLEX MICROSCOPIC - Abnormal; Notable for the following components:  ? APPearance  HAZY (*)   ? Glucose, UA 50 (*)   ? Hgb urine dipstick LARGE (*)   ? Ketones, ur 20 (*)   ? Protein, ur 100 (*)   ? Bacteria, UA RARE (*)   ? All other components within normal limits  ?I-STAT BETA HCG BLOOD, ED ([facility], [facility], [facility] ONLY)  ? ? ?EKG ?None ? ?Radiology ?CT HEAD WO CONTRAST (5MM) ? ?Result Date: 06/25/2021 ?CLINICAL DATA:  Head trauma.  Altered mental status. EXAM: CT HEAD WITHOUT CONTRAST TECHNIQUE: Contiguous axial images were obtained from the base of the skull through the vertex without intravenous contrast. RADIATION DOSE REDUCTION: This exam was performed according to the departmental dose-optimization program which includes automated exposure control, adjustment of the mA and/or kV according to patient size and/or use of iterative reconstruction technique. COMPARISON:  None Available. FINDINGS: Brain: The ventricles are normal in size and configuration. No extra-axial fluid collections are identified. The gray-white differentiation is maintained. No CT findings for acute hemispheric infarction or intracranial hemorrhage. No mass lesions. The brainstem and cerebellum are normal. Vascular: No hyperdense vessels or obvious aneurysm. Skull: No acute skull fracture.  No bone lesion. Sinuses/Orbits: The paranasal sinuses and mastoid air cells are clear. The globes are intact. Other: No scalp lesions, laceration or hematoma. IMPRESSION: Normal head CT. Electronically Signed   By: Rudie MeyerP.  Gallerani M.D.   On: 06/25/2021 13:10   ? ?Procedures ?Procedures  ? ? ?Medications Ordered in ED ?Medications  ?lactated ringers bolus 1,000 mL (has no administration in time range)  ?sodium chloride 0.9 % bolus 1,000 mL (1,000 mLs Intravenous New Bag/Given 06/25/21 1324)  ? ? ?ED Course/ Medical Decision Making/ A&P ?  ?                        ?Medical Decision Making ?Amount and/or Complexity of Data Reviewed ?Labs: ordered. ?Radiology: ordered. ? ? ?This patient presents to the ED for concern of heroin overdose, thirst, loss of  hearing, this involves a number of treatment options, and is a complaint that carries with it a moderate to high risk of complications and morbidity.  The differential diagnosis includes AKI, rhabdomyolysis, respiratory depression ? ? ?Co morbidities: ?Discussed in HPI ? ? ?Brief History: ? ?Patient is a 26 year old female brought in by EMS after she snorted a "pill of heroin" ? ?Patient is currently primarily complaining of severe thirst and not being able to hear ? ?She denies any  pain currently.  She states that she feels somewhat fatigued.  She denies any fevers cough congestion or difficulty breathing. ? ?She states that she normally does cocaine but was aware that she was doing heroin this time.  She says she has actually overdosed on heroin in the past.  On my review of the EMR it seems that this is consistent with her prior visits to the ER. ? ? ?Patient is exceedingly dehydrated appearing on exam.  She has dry oral mucosa and somewhat poor skin turgor ?She does not seem to hear me very well when I am speaking to her ? ?On reassessment this seems to have somewhat improved. ? ?EMR reviewed including pt PMHx, past surgical history and past visits to ER.  ? ?See HPI for more details ? ? ?Lab Tests: ? ? ?I ordered and independently interpreted labs. Labs notable for CK of 4000 428 patient has AKI with creatinine has doubled since last measured.  BUN elevated consistent with prerenal AKI suspect due to dehydration and physiologic stress of her heroin overdose.  Urinalysis with some ketones and protein consistent with dehydration and hemoglobin.  UDS positive for cocaine.  I-STAT ECG negative for pregnancy.  Mild transaminitis present. ? ? ?Imaging Studies: ? ?NAD. I personally reviewed all imaging studies and no acute abnormality found. I agree with radiology interpretation. ?I personally reviewed images of CT head.  Agree with radiology read no acute disease. ? ? ?Cardiac Monitoring: ? ?The patient was  maintained on a cardiac monitor.  I personally viewed and interpreted the cardiac monitored which showed an underlying rhythm of: Normal sinus rhythm ?EKG non-ischemic sinus tachycardia some high amplitude QRS complex

## 2021-06-26 ENCOUNTER — Inpatient Hospital Stay (HOSPITAL_COMMUNITY): Payer: Medicaid Other

## 2021-06-26 ENCOUNTER — Encounter (HOSPITAL_COMMUNITY): Payer: Self-pay | Admitting: Internal Medicine

## 2021-06-26 ENCOUNTER — Other Ambulatory Visit: Payer: Self-pay

## 2021-06-26 DIAGNOSIS — F1910 Other psychoactive substance abuse, uncomplicated: Secondary | ICD-10-CM

## 2021-06-26 DIAGNOSIS — N179 Acute kidney failure, unspecified: Secondary | ICD-10-CM

## 2021-06-26 DIAGNOSIS — M6282 Rhabdomyolysis: Secondary | ICD-10-CM | POA: Diagnosis not present

## 2021-06-26 DIAGNOSIS — T401X1A Poisoning by heroin, accidental (unintentional), initial encounter: Secondary | ICD-10-CM | POA: Diagnosis not present

## 2021-06-26 LAB — CK: Total CK: 5839 U/L — ABNORMAL HIGH (ref 38–234)

## 2021-06-26 LAB — CBC
HCT: 31.6 % — ABNORMAL LOW (ref 36.0–46.0)
Hemoglobin: 10.7 g/dL — ABNORMAL LOW (ref 12.0–15.0)
MCH: 30.7 pg (ref 26.0–34.0)
MCHC: 33.9 g/dL (ref 30.0–36.0)
MCV: 90.5 fL (ref 80.0–100.0)
Platelets: 253 10*3/uL (ref 150–400)
RBC: 3.49 MIL/uL — ABNORMAL LOW (ref 3.87–5.11)
RDW: 12.5 % (ref 11.5–15.5)
WBC: 10.1 10*3/uL (ref 4.0–10.5)
nRBC: 0 % (ref 0.0–0.2)

## 2021-06-26 LAB — COMPREHENSIVE METABOLIC PANEL
ALT: 74 U/L — ABNORMAL HIGH (ref 0–44)
AST: 124 U/L — ABNORMAL HIGH (ref 15–41)
Albumin: 3 g/dL — ABNORMAL LOW (ref 3.5–5.0)
Alkaline Phosphatase: 44 U/L (ref 38–126)
Anion gap: 5 (ref 5–15)
BUN: 13 mg/dL (ref 6–20)
CO2: 26 mmol/L (ref 22–32)
Calcium: 7.9 mg/dL — ABNORMAL LOW (ref 8.9–10.3)
Chloride: 110 mmol/L (ref 98–111)
Creatinine, Ser: 0.62 mg/dL (ref 0.44–1.00)
GFR, Estimated: >60 mL/min (ref >60)
Glucose, Bld: 89 mg/dL (ref 70–99)
Potassium: 3.1 mmol/L — ABNORMAL LOW (ref 3.5–5.1)
Sodium: 141 mmol/L (ref 135–145)
Total Bilirubin: 0.6 mg/dL (ref 0.3–1.2)
Total Protein: 5.4 g/dL — ABNORMAL LOW (ref 6.5–8.1)

## 2021-06-26 LAB — HEPATITIS PANEL, ACUTE
HCV Ab: NONREACTIVE
Hep A IgM: NONREACTIVE
Hep B C IgM: NONREACTIVE
Hepatitis B Surface Ag: NONREACTIVE

## 2021-06-26 LAB — HIV ANTIBODY (ROUTINE TESTING W REFLEX): HIV Screen 4th Generation wRfx: NONREACTIVE

## 2021-06-26 MED ORDER — POTASSIUM CHLORIDE 10 MEQ/100ML IV SOLN
10.0000 meq | INTRAVENOUS | Status: DC
  Filled 2021-06-26: qty 100

## 2021-06-26 MED ORDER — OXYCODONE-ACETAMINOPHEN 5-325 MG PO TABS
1.0000 | ORAL_TABLET | ORAL | Status: DC | PRN
  Administered 2021-06-26 – 2021-06-27 (×3): 1 via ORAL
  Filled 2021-06-26 (×3): qty 1

## 2021-06-26 MED ORDER — POTASSIUM CHLORIDE CRYS ER 20 MEQ PO TBCR
40.0000 meq | EXTENDED_RELEASE_TABLET | ORAL | Status: AC
  Administered 2021-06-26 – 2021-06-27 (×3): 40 meq via ORAL
  Filled 2021-06-26 (×3): qty 2

## 2021-06-26 MED ORDER — ENOXAPARIN SODIUM 40 MG/0.4ML IJ SOSY
40.0000 mg | PREFILLED_SYRINGE | INTRAMUSCULAR | Status: DC
  Administered 2021-06-27 – 2021-06-28 (×2): 40 mg via SUBCUTANEOUS
  Filled 2021-06-26 (×3): qty 0.4
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
## 2021-06-26 NOTE — Progress Notes (Signed)
Dr. Sharl Ma notified of pt being unable to tolerate IV potassium. No new orders received. ?

## 2021-06-27 DIAGNOSIS — F1910 Other psychoactive substance abuse, uncomplicated: Secondary | ICD-10-CM | POA: Diagnosis not present

## 2021-06-27 DIAGNOSIS — N179 Acute kidney failure, unspecified: Secondary | ICD-10-CM | POA: Diagnosis not present

## 2021-06-27 DIAGNOSIS — T401X1A Poisoning by heroin, accidental (unintentional), initial encounter: Secondary | ICD-10-CM | POA: Diagnosis not present

## 2021-06-27 DIAGNOSIS — M6282 Rhabdomyolysis: Secondary | ICD-10-CM | POA: Diagnosis not present

## 2021-06-27 LAB — CBC
HCT: 32.7 % — ABNORMAL LOW (ref 36.0–46.0)
Hemoglobin: 11.1 g/dL — ABNORMAL LOW (ref 12.0–15.0)
MCH: 31.4 pg (ref 26.0–34.0)
MCHC: 33.9 g/dL (ref 30.0–36.0)
MCV: 92.4 fL (ref 80.0–100.0)
Platelets: 235 10*3/uL (ref 150–400)
RBC: 3.54 MIL/uL — ABNORMAL LOW (ref 3.87–5.11)
RDW: 12.6 % (ref 11.5–15.5)
WBC: 8.8 10*3/uL (ref 4.0–10.5)
nRBC: 0 % (ref 0.0–0.2)

## 2021-06-27 LAB — COMPREHENSIVE METABOLIC PANEL
ALT: 122 U/L — ABNORMAL HIGH (ref 0–44)
AST: 158 U/L — ABNORMAL HIGH (ref 15–41)
Albumin: 2.7 g/dL — ABNORMAL LOW (ref 3.5–5.0)
Alkaline Phosphatase: 46 U/L (ref 38–126)
Anion gap: 5 (ref 5–15)
BUN: 7 mg/dL (ref 6–20)
CO2: 23 mmol/L (ref 22–32)
Calcium: 7.8 mg/dL — ABNORMAL LOW (ref 8.9–10.3)
Chloride: 110 mmol/L (ref 98–111)
Creatinine, Ser: 0.61 mg/dL (ref 0.44–1.00)
GFR, Estimated: >60 mL/min (ref >60)
Glucose, Bld: 114 mg/dL — ABNORMAL HIGH (ref 70–99)
Potassium: 4.5 mmol/L (ref 3.5–5.1)
Sodium: 138 mmol/L (ref 135–145)
Total Bilirubin: 0.4 mg/dL (ref 0.3–1.2)
Total Protein: 5.1 g/dL — ABNORMAL LOW (ref 6.5–8.1)

## 2021-06-27 LAB — CK: Total CK: 5479 U/L — ABNORMAL HIGH (ref 38–234)

## 2021-06-27 MED ORDER — THIAMINE HCL 100 MG/ML IJ SOLN
100.0000 mg | Freq: Every day | INTRAMUSCULAR | Status: DC
  Administered 2021-06-28 – 2021-06-29 (×2): 100 mg via INTRAVENOUS
  Filled 2021-06-27 (×2): qty 2

## 2021-06-27 MED ORDER — THIAMINE HCL 100 MG PO TABS: 100.0000 mg | ORAL_TABLET | Freq: Every day | ORAL | Status: DC

## 2021-06-27 MED ORDER — LORAZEPAM 2 MG/ML IJ SOLN
1.0000 mg | INTRAMUSCULAR | Status: DC | PRN
  Administered 2021-06-27 – 2021-06-28 (×2): 2 mg via INTRAVENOUS
  Administered 2021-06-28: 3 mg via INTRAVENOUS
  Administered 2021-06-28: 2 mg via INTRAVENOUS
  Administered 2021-06-29: 1 mg via INTRAVENOUS
  Filled 2021-06-27 (×5): qty 1
  Filled 2021-06-27: qty 2

## 2021-06-27 MED ORDER — BENZONATATE 100 MG PO CAPS
200.0000 mg | ORAL_CAPSULE | Freq: Three times a day (TID) | ORAL | Status: DC | PRN
  Administered 2021-06-27: 200 mg via ORAL
  Filled 2021-06-27: qty 2

## 2021-06-27 MED ORDER — FOLIC ACID 1 MG PO TABS
1.0000 mg | ORAL_TABLET | Freq: Every day | ORAL | Status: DC
  Administered 2021-06-28 – 2021-06-29 (×2): 1 mg via ORAL
  Filled 2021-06-27 (×2): qty 1

## 2021-06-27 MED ORDER — ADULT MULTIVITAMIN W/MINERALS CH
1.0000 | ORAL_TABLET | Freq: Every day | ORAL | Status: DC
  Administered 2021-06-28 – 2021-06-29 (×2): 1 via ORAL
  Filled 2021-06-27 (×2): qty 1

## 2021-06-27 MED ORDER — NICOTINE 21 MG/24HR TD PT24
21.0000 mg | MEDICATED_PATCH | Freq: Every day | TRANSDERMAL | Status: DC
  Administered 2021-06-27 – 2021-06-29 (×3): 21 mg via TRANSDERMAL
  Filled 2021-06-27 (×3): qty 1

## 2021-06-27 MED ORDER — LORAZEPAM 1 MG PO TABS
1.0000 mg | ORAL_TABLET | ORAL | Status: DC | PRN
  Administered 2021-06-28 – 2021-06-29 (×2): 1 mg via ORAL
  Filled 2021-06-27: qty 1
  Filled 2021-06-27: qty 3

## 2021-06-27 NOTE — Progress Notes (Signed)
[facility] ? ?PROGRESS NOTE ? ?Sanari Offner [facility] DUK:[id number] DOB: Nov 09, 1995 DOA: 06/25/2021 ?PCP: [facility] ? ? ?Brief HPI:   ?26 year old female with medical history of cocaine abuse, heroin abuse presents with altered mental status.  Patient states that she was likely given heroin by the drug dealer.  Patient was found unresponsive when her boyfriend tried to wake her up.  She was given Narcan and EMS was called. ?In the ED, patient was alert however was found to be in rhabdomyolysis.  CK was elevated to 4,428. ? ? ? ?Subjective  ? ?Patient seen and examined, denies any complaints. ? ? Assessment/Plan:  ? ? ?Altered mental status/polysubstance abuse ?-Mental status back to normal ?-Likely from heroin abuse ?-UDS positive for cocaine only ? ?Rhabdomyolysis ?-CK was elevated to 4428, went up to 5839 on 06/26/2021 ?-Today CK is 5479 ?-Continue normal saline at 150 mL/h ?-Follow CK level in a.m. ? ?Transaminitis ?-LFTs are still elevated ?-Likely from rhabdomyolysis ?-Hepatitis panel negative ?-Abdominal ultrasound unremarkable ? ?Buttock pain ?-Likely from rhabdomyolysis ?-pelvic x-ray along with right hip x-ray was unremarkable ? ?Leukocytosis ?-Resolved ?-Likely reactive ?-She is afebrile, chest x-ray unremarkable ? ?Hyperglycemia ?-Resolved ?-Hemoglobin A1c is 5.4 ? ?Hypokalemia ?-Resolved ? ?Acute kidney injury ?-Creatinine was elevated 1.23 ?-Likely from dehydration/rhabdo ?-Today creatinine improved to 0.62 ? ?Hearing loss ?    - EDPA spoke with neuro; recommended no further w/u ?    -CT head negative ?    -Symptoms have completely resolved ? ? ? ? ?Medications ? ?  ? enoxaparin (LOVENOX) injection  40 mg Subcutaneous Q24H  ? ? ? Data Reviewed:  ? ?CBG: ? ?No results for input(s): GLUCAP in the last 168 hours. ? ?SpO2: 98 %  ? ? ?Vitals:  ? 06/26/21 1607 06/26/21 2001 06/27/21 0545 06/27/21 1231  ?BP: 94/79 109/67 108/72 119/75  ?Pulse: 66 (!) 58 65 64  ?Resp: 16 18 18 20    ?Temp: 98.2 ?F (36.8 ?C) 98.8 ?F (37.1 ?C) 98.4 ?F (36.9 ?C) 98.7 ?F (37.1 ?C)  ?TempSrc: Oral Oral Oral Oral  ?SpO2: 100% 98% 93% 98%  ?Weight:      ?Height:      ? ? ? ? ?Data Reviewed: ? ?Basic Metabolic Panel: ?Recent Labs  ?Lab 06/25/21 ?1323 06/26/21 ?06/28/21 06/27/21 ?06/29/21  ?NA 140 141 138  ?K 4.3 3.1* 4.5  ?CL 105 110 110  ?CO2 22 26 23   ?GLUCOSE 148* 89 114*  ?BUN 29* 13 7  ?CREATININE 1.23* 0.62 0.61  ?CALCIUM 8.9 7.9* 7.8*  ? ? ?CBC: ?Recent Labs  ?Lab 06/25/21 ?1323 06/26/21 ?06/27/21 06/27/21 ?1761  ?WBC 22.4* 10.1 8.8  ?HGB 14.2 10.7* 11.1*  ?HCT 42.2 31.6* 32.7*  ?MCV 89.2 90.5 92.4  ?PLT 360 253 235  ? ? ?LFT ?Recent Labs  ?Lab 06/25/21 ?1323 06/26/21 ?06/27/21 06/27/21 ?7106  ?AST 176* 124* 158*  ?ALT 105* 74* 122*  ?ALKPHOS 66 44 46  ?BILITOT 0.7 0.6 0.4  ?PROT 8.0 5.4* 5.1*  ?ALBUMIN 4.5 3.0* 2.7*  ? ?  ?Antibiotics: ?Anti-infectives (From admission, onward)  ? ? None  ? ?  ? ? ? ?DVT prophylaxis: Lovenox ? ?Code Status: Full code ? ?Family Communication: No family at bedside ? ? ?CONSULTS  ? ? ?Objective  ? ? ?Physical Examination: ? ? ?General-appears in no acute distress ?Heart-S1-S2, regular, no murmur auscultated ?Lungs-clear to auscultation bilaterally, no wheezing or crackles auscultated ?Abdomen-soft, nontender, no organomegaly ?Extremities-no edema in the lower extremities ?Neuro-alert,  oriented x3, no focal deficit noted ? ? ?Status is: Inpatient:   ? ? ? ?  ? ? ?Meredeth Ide ?  ?[facility] ?If 7PM-7AM, please contact night-coverage at [email or web address], ?Office  [phone] ? ? ?06/27/2021, 2:09 PM  LOS: 2 days  ? ? ? ? ? ? ? ? ? ? ?  ?

## 2021-06-27 NOTE — Plan of Care (Signed)

## 2021-06-27 NOTE — Progress Notes (Signed)
Transition of Care (TOC) Screening Note ? ?Patient Details  ?Name: Cassidy Underwood [facility] ?Date of Birth: 1995-05-03 ? ?Transition of Care (TOC) CM/SW Contact:    ?Ewing Schlein, LCSW ?Phone Number: ?06/27/2021, 11:14 AM ? ?Transition of Care Department [facility]) has reviewed patient and no TOC needs have been identified at this time. We will continue to monitor patient advancement through interdisciplinary progression rounds. If new patient transition needs arise, please place a TOC consult. ?

## 2021-06-28 ENCOUNTER — Encounter (HOSPITAL_COMMUNITY): Payer: Self-pay | Admitting: Internal Medicine

## 2021-06-28 DIAGNOSIS — M6282 Rhabdomyolysis: Secondary | ICD-10-CM | POA: Diagnosis not present

## 2021-06-28 DIAGNOSIS — R7989 Other specified abnormal findings of blood chemistry: Secondary | ICD-10-CM

## 2021-06-28 DIAGNOSIS — T401X1A Poisoning by heroin, accidental (unintentional), initial encounter: Secondary | ICD-10-CM | POA: Diagnosis not present

## 2021-06-28 DIAGNOSIS — F1910 Other psychoactive substance abuse, uncomplicated: Secondary | ICD-10-CM | POA: Diagnosis not present

## 2021-06-28 DIAGNOSIS — F10930 Alcohol use, unspecified with withdrawal, uncomplicated: Secondary | ICD-10-CM

## 2021-06-28 DIAGNOSIS — N179 Acute kidney failure, unspecified: Secondary | ICD-10-CM | POA: Diagnosis not present

## 2021-06-28 LAB — COMPREHENSIVE METABOLIC PANEL
ALT: 105 U/L — ABNORMAL HIGH (ref 0–44)
AST: 101 U/L — ABNORMAL HIGH (ref 15–41)
Albumin: 2.8 g/dL — ABNORMAL LOW (ref 3.5–5.0)
Alkaline Phosphatase: 51 U/L (ref 38–126)
Anion gap: 4 — ABNORMAL LOW (ref 5–15)
BUN: <5 mg/dL — ABNORMAL LOW (ref 6–20)
CO2: 26 mmol/L (ref 22–32)
Calcium: 8.1 mg/dL — ABNORMAL LOW (ref 8.9–10.3)
Chloride: 110 mmol/L (ref 98–111)
Creatinine, Ser: 0.53 mg/dL (ref 0.44–1.00)
GFR, Estimated: >60 mL/min (ref >60)
Glucose, Bld: 104 mg/dL — ABNORMAL HIGH (ref 70–99)
Potassium: 3.7 mmol/L (ref 3.5–5.1)
Sodium: 140 mmol/L (ref 135–145)
Total Bilirubin: 0.7 mg/dL (ref 0.3–1.2)
Total Protein: 5.3 g/dL — ABNORMAL LOW (ref 6.5–8.1)

## 2021-06-28 LAB — CK: Total CK: 3723 U/L — ABNORMAL HIGH (ref 38–234)

## 2021-06-28 NOTE — Progress Notes (Signed)
Patient walked in hallway with assistance and FWW.  ?

## 2021-06-28 NOTE — Progress Notes (Signed)
[facility] ? ?PROGRESS NOTE ? ?Graceanne Primack [facility] FY:[id number] DOB: October 13, 1995 DOA: 06/25/2021 ?PCP: [facility] ? ? ?Brief HPI:   ?26 year old female with medical history of cocaine abuse, heroin abuse presents with altered mental status.  Patient states that she was likely given heroin by the drug dealer.  Patient was found unresponsive when her boyfriend tried to wake her up.  She was given Narcan and EMS was called. ?In the ED, patient was alert however was found to be in rhabdomyolysis.  CK was elevated to 4,428. ? ? ? ?Subjective  ? ?Patient seen and examined, underwent alcohol drawl last night.  Started on Ativan per [facility]WA protocol.  She is somnolent this morning after she received Ativan this morning. ? ? Assessment/Plan:  ? ? ?Altered mental status/polysubstance abuse ?-Mental status back to normal ?-Likely from heroin abuse ?-UDS positive for cocaine only ? ?Hypersomnolence ?-Received Ativan this morning and has been very somnolent ?-We will monitor ? ?Alcohol withdrawal ?-Started on Ativan per [facility]WA protocol ?-Continue thiamine, folic acid ? ? ?Rhabdomyolysis ?-CK was elevated to 4428, went up to 5839 on 06/26/2021 ?-Today CK is down to 3723 ?-Continue normal saline at 150 mL/h ?-Follow CK level in a.m. ? ?Transaminitis ?-LFTs are improving ?-Likely from rhabdomyolysis ?-Hepatitis panel negative ?-Abdominal ultrasound unremarkable ? ?Buttock pain ?-Likely from rhabdomyolysis ?-pelvic x-ray along with right hip x-ray was unremarkable ? ?Leukocytosis ?-Resolved ?-Likely reactive ?-She is afebrile, chest x-ray unremarkable ? ?Hyperglycemia ?-Resolved ?-Hemoglobin A1c is 5.4 ? ?Hypokalemia ?-Resolved ? ?Acute kidney injury ?-Creatinine was elevated 1.23 ?-Likely from dehydration/rhabdo ?-creatinine improved to 0.53 ? ?Hearing loss ?    - EDPA spoke with neuro; recommended no further w/u ?    -CT head negative ?    -Symptoms have completely resolved ? ? ? ? ?Medications ? ?  ?  enoxaparin (LOVENOX) injection  40 mg Subcutaneous [id number]  ? folic acid  1 mg Oral Daily  ? multivitamin with minerals  1 tablet Oral Daily  ? nicotine  21 mg Transdermal Daily  ? thiamine  100 mg Oral Daily  ? Or  ? thiamine  100 mg Intravenous Daily  ? ? ? Data Reviewed:  ? ?CBG: ? ?No results for input(s): GLUCAP in the last 168 hours. ? ?SpO2: 98 %  ? ? ?Vitals:  ? 06/27/21 1933 06/28/21 0001 06/28/21 0515 06/28/21 0824  ?BP: 127/83 134/82 127/81 132/74  ?Pulse: 74 66 66 70  ?Resp: 18  20   ?Temp: 98.9 ?F (37.2 ?C)  98.1 ?F (36.7 ?C)   ?TempSrc: Oral  Oral   ?SpO2: 99%  98%   ?Weight:      ?Height:      ? ? ? ? ?Data Reviewed: ? ?Basic Metabolic Panel: ?Recent Labs  ?Lab 06/25/21 ?1323 06/26/21 ?[id number] 06/27/21 ?[id number] 06/28/21 ?[id number]  ?NA 140 141 138 140  ?K 4.3 3.1* 4.5 3.7  ?CL 105 110 110 110  ?CO2 22 26 23 26   ?GLUCOSE 148* 89 114* 104*  ?BUN 29* 13 7 <5*  ?CREATININE 1.23* 0.62 0.61 0.53  ?CALCIUM 8.9 7.9* 7.8* 8.1*  ? ? ?CBC: ?Recent Labs  ?Lab 06/25/21 ?1323 06/26/21 ?[id number] 06/27/21 ?[id number]  ?WBC 22.4* 10.1 8.8  ?HGB 14.2 10.7* 11.1*  ?HCT 42.2 31.6* 32.7*  ?MCV 89.2 90.5 92.4  ?PLT 360 253 235  ? ? ?LFT ?Recent Labs  ?Lab 06/25/21 ?1323 06/26/21 ?[id number] 06/27/21 ?[id number] 06/28/21 ?[id number]  ?AST 176* 124* 158* 101*  ?ALT 105*  74* 122* 105*  ?ALKPHOS 66 44 46 51  ?BILITOT 0.7 0.6 0.4 0.7  ?PROT 8.0 5.4* 5.1* 5.3*  ?ALBUMIN 4.5 3.0* 2.7* 2.8*  ? ?  ?Antibiotics: ?Anti-infectives (From admission, onward)  ? ? None  ? ?  ? ? ? ?DVT prophylaxis: Lovenox ? ?Code Status: Full code ? ?Family Communication: No family at bedside ? ? ?CONSULTS  ? ? ?Objective  ? ? ?Physical Examination: ? ? ?General-appears in no acute distress ?Heart-S1-S2, regular, no murmur auscultated ?Lungs-clear to auscultation bilaterally, no wheezing or crackles auscultated ?Abdomen-soft, nontender, no organomegaly ?Extremities-no edema in the lower extremities ?Neuro-somnolent, arousable to sternal rub ? ?Status is: Inpatient:   ? ? ? ?  ? ? ?Oswald Hillock ?  ?[facility] ?If 7PM-7AM, please contact night-coverage at [email or web address], ?Office  [phone] ? ? ?06/28/2021, 11:31 AM  LOS: 3 days  ? ? ? ? ? ? ? ? ? ? ?  ?

## 2021-06-28 NOTE — Plan of Care (Signed)

## 2021-06-29 DIAGNOSIS — D72829 Elevated white blood cell count, unspecified: Secondary | ICD-10-CM

## 2021-06-29 DIAGNOSIS — R739 Hyperglycemia, unspecified: Secondary | ICD-10-CM

## 2021-06-29 DIAGNOSIS — T50901A Poisoning by unspecified drugs, medicaments and biological substances, accidental (unintentional), initial encounter: Secondary | ICD-10-CM

## 2021-06-29 MED ORDER — SODIUM CHLORIDE 0.9 % IV SOLN: INTRAVENOUS | Status: AC

## 2021-06-29 NOTE — Discharge Summary (Signed)
Physician Discharge Summary  ?Cassidy [facility] NID:[id number] DOB: Jan 10, 1996 DOA: 06/25/2021 ? ?PCP: [facility] ? ?Admit date: 06/25/2021 ?Discharge date: 06/29/2021 ? ?Admitted From: Home ?Disposition:  Home ? ?     Left AMA ?Recommendations for Outpatient Follow-up:  ?None ? ?Home Health:No ?Equipment/Devices:nONE ? ?Discharge Condition:Stable ?CODE STATUS:Full ?Diet recommendation: Heart Healthy  ? ?Brief/Interim Summary: ?26 y.o. female past medical history of cocaine and heroin abuse comes in with altered mental status, as per boyfriend she recently used heroin was found unresponsive, was given Narcan brought into the ED found to be in rhabdomyolysis. ? ?Discharge Diagnoses:  ?Principal Problem: ?  Rhabdomyolysis ?Active Problems: ?  Polysubstance abuse (HCC) ?  Drug overdose, accidental or unintentional, initial encounter ?  AKI (acute kidney injury) (HCC) ?  Leukocytosis ?  Elevated LFTs ?  Hyperglycemia ?  Hearing loss ? ?Acute metabolic encephalopathy: ?Likely due to heroin abuse which responded to Narcan which was given to her by her boyfriend. ?UDS was positive for cocaine, she relates she had been taking oxycodone. ?After conservative management she has now returned to baseline.  She did admit to me that she did took extra doses of oxycodone. ?After she was told the rules that she was not getting any additional narcotics besides her home regimen as she was somnolent, she told the nurse she was going to leave AGAINST MEDICAL ADVICE. ?I went to the room and discussed with the patient the risk and benefits of doing so she especially understands despite this, she still left AGAINST MEDICAL ADVICE. ? ?Hypersomnolence: ?She received Ativan and narcotics. ?The additional narcotics was discontinued. ? ?Alcohol withdrawal: ?She was started on thiamine and folate her withdrawal symptoms were controlled with Ativan. ? ?Polysubstance abuse: ?Her UDS was negative for narcotic she does admit  to taking several doses of oxycodone, she does admit taking extra doses of her oxycodone, which is probably why she responded to Narcan that her was given to her by her boyfriend. ? ?Nontraumatic rhabdomyolysis: ?Likely due to immobility she was started on aggressive fluid hydration, the day prior to admission her CK was 3700 she was bolused and her IV fluids were increased. ?I explained to her that her CK can go up if we do not hydrate her well which might affect her kidneys. ?She understands the risk and benefits and she is able to express back to me the complications and risk of leaving. ? ?Elevated LFTs: ?Negative rhabdomyolysis, abdominal ultrasound was unremarkable acute hepatitis panel was negative. ? ?Buttock pain: ?Likely due to immobility was not as tender to palpation she relates. ? ?Leukocytosis: ?Likely reactive due to rhabdomyolysis. ? ?Hyperglycemia: ?Now resolved A1c of 5.4. ? ?Hypokalemia: ?Replete orally now resolved. ? ?Acute kidney injury: ?Likely due to pre-Azotemia plus minus rhabdomyolysis resolved with IV fluid hydration resuscitation. ? ? ? ?Discharge Instructions ? ? ?Allergies as of 06/29/2021   ?No Known Allergies ?  ? ?  ?Medication List  ?  ?You have not been prescribed any medications. ?  ? ? ?No Known Allergies ? ?Consultations: ?None ? ? ?Procedures/Studies: ?CT HEAD WO CONTRAST ( ) ? ?Result Date: 06/25/2021 ?CLINICAL DATA:  Head trauma.  Altered mental status. EXAM: CT HEAD WITHOUT CONTRAST TECHNIQUE: Contiguous axial images were obtained from the base of the skull through the vertex without intravenous contrast. RADIATION DOSE REDUCTION: This exam was performed according to the departmental dose-optimization program which includes automated exposure control, adjustment of the mA and/or kV according to patient size and/or use  of iterative reconstruction technique. COMPARISON:  None Available. FINDINGS: Brain: The ventricles are normal in size and configuration. No extra-axial fluid  collections are identified. The gray-white differentiation is maintained. No CT findings for acute hemispheric infarction or intracranial hemorrhage. No mass lesions. The brainstem and cerebellum are normal. Vascular: No hyperdense vessels or obvious aneurysm. Skull: No acute skull fracture.  No bone lesion. Sinuses/Orbits: The paranasal sinuses and mastoid air cells are clear. The globes are intact. Other: No scalp lesions, laceration or hematoma. IMPRESSION: Normal head CT. Electronically Signed   By: Rudie MeyerP.  Gallerani M.D.   On: 06/25/2021 13:10  ? ?US Abdomen Complete ? ?Result Date: 06/26/2021 ?CLINICAL DATA:  Acute kidney injury.  Elevated LFTs. EXAM: ABDOMEN ULTRASOUND COMPLETE COMPARISON:  None Available. FINDINGS: Gallbladder: No gallstones or wall thickening visualized. No sonographic Murphy sign noted by sonographer. Common bile duct: Diameter: 2.3 mm. No intrahepatic bile duct dilatation. Liver: No focal lesion identified. Within normal limits in parenchymal echogenicity. Portal vein is patent on color Doppler imaging with normal direction of blood flow towards the liver. IVC: No abnormality visualized. Pancreas: Visualized portion unremarkable. Spleen: Size and appearance within normal limits. Right Kidney: Length: 11.6 cm. Echogenicity within normal limits. No mass or hydronephrosis visualized. Left Kidney: Length: 11.1 cm. Echogenicity within normal limits. No mass or hydronephrosis visualized. Abdominal aorta: No aneurysm visualized. Other findings: Small volume of free fluid noted within Morrison's pouch. IMPRESSION: 1. Small volume of free fluid noted within Morrison's pouch which is a nonspecific finding. 2. No acute abnormality and no explanation for patient's acute kidney injury or elevated LFTs. Identified. Electronically Signed   By: Signa Kellaylor  Stroud M.D.   On: 06/26/2021 08:09  ? ?Portable chest 1 View ? ?Result Date: 06/25/2021 ?CLINICAL DATA:  Altered mental status EXAM: PORTABLE CHEST 1 VIEW  COMPARISON:  None Available. FINDINGS: The heart size and mediastinal contours are within normal limits. Both lungs are clear. The visualized skeletal structures are unremarkable. IMPRESSION: No active disease. Electronically Signed   By: Alcide CleverMark  Lukens M.D.   On: 06/25/2021 21:33  ? ?DG HIP UNILAT WITH PELVIS 2-3 VIEWS RIGHT ? ?Result Date: 06/26/2021 ?CLINICAL DATA:  Right buttock and thigh pain, no known injury EXAM: DG HIP (WITH OR WITHOUT PELVIS) 2-3V RIGHT COMPARISON:  None Available. FINDINGS: There is no evidence of hip fracture or dislocation. There is no evidence of arthropathy or other focal bone abnormality. Nonobstructive pattern of overlying bowel gas. IMPRESSION: No fracture or dislocation of the right hip. Joint spaces are well preserved. Electronically Signed   By: Jearld LeschAlex D Bibbey M.D.   On: 06/26/2021 12:53   ?(Echo, Carotid, EGD, Colonoscopy, ERCP)  ? ? ?Subjective: ?She expressed no complaints, when she insisted on leaving as she has things to do. ? ?Discharge Exam: ?Vitals:  ? 06/29/21 0136 06/29/21 0527  ?BP: 122/77 135/89  ?Pulse: 81 71  ?Resp: 18 18  ?Temp: 98.9 ?F (37.2 ?C) 98.3 ?F (36.8 ?C)  ?SpO2: 100% 99%  ? ?Vitals:  ? 06/28/21 1346 06/28/21 2101 06/29/21 0136 06/29/21 0527  ?BP: 138/84 127/79 122/77 135/89  ?Pulse: 79 78 81 71  ?Resp: 20 (!) 21 18 18   ?Temp: 98.3 ?F (36.8 ?C) 98.6 ?F (37 ?C) 98.9 ?F (37.2 ?C) 98.3 ?F (36.8 ?C)  ?TempSrc: Oral Oral Oral Oral  ?SpO2: 97% 100% 100% 99%  ?Weight:      ?Height:      ? ? ?General: Pt is alert, awake, not in acute distress ?Cardiovascular: RRR, S1/S2 +, no  rubs, no gallops ?Respiratory: CTA bilaterally, no wheezing, no rhonchi ?Abdominal: Soft, NT, ND, bowel sounds + ?Extremities: no edema, no cyanosis ? ? ? ?The results of significant diagnostics from this hospitalization (including imaging, microbiology, ancillary and laboratory) are listed below for reference.   ? ? ?Microbiology: ?No results found for this or any previous visit (from the past  240 hour(s)).  ? ?Labs: ?BNP (last 3 results) ?No results for input(s): BNP in the last 8760 hours. ?Basic Metabolic Panel: ?Recent Labs  ?Lab 06/25/21 ?1323 06/26/21 ?6440 06/27/21 ?3474 06/28/21 ?2595  ?

## 2021-06-29 NOTE — Progress Notes (Signed)
Patient is asking to leave AMA, discussed with Dr. Feliz Ortiz; he is aware patient is leaving AMA. AMA paperwork signed by patient, patient is aware of the risks of her leaving AMA, aware that she will not receive any prescription medication or assistance, patient still deciding to leave.  ?

## 2021-06-29 NOTE — Progress Notes (Signed)
[facility] ?PROGRESS NOTE ? ? ? ?Progress Note  ?Cassidy Underwood [facility]  HQI:[id number] DOB: 05/15/1995 DOA: 06/25/2021 ?PCP: [facility]  ? ? ? ?Brief Narrative:  ? ?Cassidy Underwood [facility] is an 26 y.o. female past medical history of cocaine and heroin abuse comes in with altered mental status, as per boyfriend she recently used heroin was found unresponsive, was given Narcan brought into the ED found to be in rhabdomyolysis. ? ? ? ? ?Assessment/Plan:  ? ?Acute metabolic encephalopathy: ?Likely due to heroin abuse which responded to Narcan. ?UDS was only positive for cocaine. ?Now back to baseline she does admit to using oxycodone she relates she might of taken extra doses. ? ?Hypersomnolence: ?She is currently getting narcotics and Ativan. ?Discontinue narcotics. ? ?Alcohol withdrawal: ?Started on thiamine and folate. ?CIWA protocol 7 continue Ativan protocol. ? ?Nontraumatic rhabdomyolysis: ?CK yesterday was 3700, continue aggressive IV fluid hydration creatinine has remained at baseline. ?Recheck CK tomorrow morning ? ?Elevated LFTs: ?Likely due to rhabdomyolysis now resolved. ?Acute otitis panel negative abdominal ultrasound was unremarkable. ? ?Buttock pain: ?Likely due to rhabdomyolysis imaging was unremarkable. ? ?Leukocytosis: ?Likely reactive due to rhabdomyolysis. ? ?Hyperglycemia: ?Now resolved A1c of 5.4. ? ?Hypokalemia: ?Resolved. ? ?Acute kidney injury: ?Likely prerenal azotemia plus or minus rhabdomyolysis resolved with IV fluid hydration. ?Continue IV fluids for an additional 24 hours. ? ?Hearing loss: ?CT of the head negative . ? ? ?DVT prophylaxis: lovenox ?Family Communication:none ?Status is: Inpatient ?Remains inpatient appropriate because: Acute rhabdomyolysis. ? ? ? ?Code Status:  ? ?  ?Code Status Orders  ?(From admission, onward)  ?  ? ? ?  ? ?  Start     Ordered  ? 06/25/21 1847  Full code  Continuous       ? 06/25/21 1846  ? ?  ?  ? ?  ? ?Code Status History   ? ?  Date Active Date Inactive Code Status Order ID Comments User Context  ? 12/16/2018 0827 12/17/2018 1815 Full Code [id number]  Rolm Bookbinder, CNM Inpatient  ? 12/16/2018 0106 12/16/2018 7253 Full Code [id number]  Derrel Nip, MD Inpatient  ? 02/22/2018 2004 02/26/2018 1636 Full Code [id number]  Shari Prows, MD Inpatient  ? 11/13/2017 1032 11/15/2017 1722 Full Code [id number]  Tamera Stands, DO Inpatient  ? 11/13/2017 0144 11/13/2017 1032 Full Code [id number]  Tamera Stands, DO Inpatient  ? 11/08/2016 1850 11/10/2016 1502 Full Code [id number]  Lennox Solders, MD Inpatient  ? 11/08/2016 0744 11/08/2016 1850 Full Code [id number]  Marylene Land, CNM Inpatient  ? 10/02/2016 2058 10/04/2016 1659 Full Code [id number]  Catalina Antigua, MD Inpatient  ? ?  ? ? ? ? ?IV Access:  ? ?Peripheral IV ? ? ?Procedures and diagnostic studies:  ? ?No results found. ? ? ?Medical Consultants:  ? ?None. ? ? ?Subjective:  ? ? ?Cassidy Underwood [facility] she relates her buttock pain is improved. ? ?Objective:  ? ? ?Vitals:  ? 06/28/21 1346 06/28/21 2101 06/29/21 0136 06/29/21 0527  ?BP: 138/84 127/79 122/77 135/89  ?Pulse: 79 78 81 71  ?Resp: 20 (!) 21 18 18   ?Temp: 98.3 ?F (36.8 ?C) 98.6 ?F (37 ?C) 98.9 ?F (37.2 ?C) 98.3 ?F (36.8 ?C)  ?TempSrc: Oral Oral Oral Oral  ?SpO2: 97% 100% 100% 99%  ?Weight:      ?Height:      ? ?SpO2: 99 % ? ? ?Intake/Output Summary (Last 24 hours) at 06/29/2021 0957 ?Last data filed  at 06/29/2021 1610 ?Gross per 24 hour  ?Intake 1996.08 ml  ?Output 450 ml  ?Net 1546.08 ml  ? ?Filed Weights  ? 06/26/21 1517  ?Weight: 48.5 kg  ? ? ?Exam: ?General exam: In no acute distress. ?Respiratory system: Good air movement and clear to auscultation. ?Cardiovascular system: S1 & S2 heard, RRR. No JVD. ?Gastrointestinal system: Abdomen is nondistended, soft and nontender.  ?Extremities: No pedal edema. ?Skin: No rashes, lesions or ulcers ?Psychiatry: Judgement and insight appear normal. Mood & affect appropriate.   ? ? ?Data Reviewed:  ? ? ?Labs: ?Basic Metabolic Panel: ?Recent Labs  ?Lab 06/25/21 ?1323 06/26/21 ?9604 06/27/21 ?5409 06/28/21 ?8119  ?NA 140 141 138 140  ?K 4.3 3.1* 4.5 3.7  ?CL 105 110 110 110  ?CO2 22 26 23 26   ?GLUCOSE 148* 89 114* 104*  ?BUN 29* 13 7 <5*  ?CREATININE 1.23* 0.62 0.61 0.53  ?CALCIUM 8.9 7.9* 7.8* 8.1*  ? ?GFR ?Estimated Creatinine Clearance: 71.7 mL/min (by C-G formula based on SCr of 0.53 mg/dL). ?Liver Function Tests: ?Recent Labs  ?Lab 06/25/21 ?1323 06/26/21 ?06/28/21 06/27/21 ?06/29/21 06/28/21 ?06/30/21  ?AST 176* 124* 158* 101*  ?ALT 105* 74* 122* 105*  ?ALKPHOS 66 44 46 51  ?BILITOT 0.7 0.6 0.4 0.7  ?PROT 8.0 5.4* 5.1* 5.3*  ?ALBUMIN 4.5 3.0* 2.7* 2.8*  ? ?No results for input(s): LIPASE, AMYLASE in the last 168 hours. ?No results for input(s): AMMONIA in the last 168 hours. ?Coagulation profile ?No results for input(s): INR, PROTIME in the last 168 hours. ?COVID-19 Labs ? ?No results for input(s): DDIMER, FERRITIN, LDH, CRP in the last 72 hours. ? ?Lab Results  ?Component Value Date  ? SARSCOV2NAA NEGATIVE 12/16/2018  ? ? ?CBC: ?Recent Labs  ?Lab 06/25/21 ?1323 06/26/21 ?06/28/21 06/27/21 ?06/29/21  ?WBC 22.4* 10.1 8.8  ?HGB 14.2 10.7* 11.1*  ?HCT 42.2 31.6* 32.7*  ?MCV 89.2 90.5 92.4  ?PLT 360 253 235  ? ?Cardiac Enzymes: ?Recent Labs  ?Lab 06/25/21 ?1323 06/26/21 ?06/28/21 06/27/21 ?06/29/21 06/28/21 ?06/30/21  ?CKTOTAL 4,428* 3244* 5,479* 3,723*  ? ?BNP (last 3 results) ?No results for input(s): PROBNP in the last 8760 hours. ?CBG: ?No results for input(s): GLUCAP in the last 168 hours. ?D-Dimer: ?No results for input(s): DDIMER in the last 72 hours. ?Hgb A1c: ?No results for input(s): HGBA1C in the last 72 hours. ?Lipid Profile: ?No results for input(s): CHOL, HDL, LDLCALC, TRIG, CHOLHDL, LDLDIRECT in the last 72 hours. ?Thyroid function studies: ?No results for input(s): TSH, T4TOTAL, T3FREE, THYROIDAB in the last 72 hours. ? ?Invalid input(s): FREET3 ?Anemia work up: ?No results for input(s): VITAMINB12,  FOLATE, FERRITIN, TIBC, IRON, RETICCTPCT in the last 72 hours. ?Sepsis Labs: ?Recent Labs  ?Lab 06/25/21 ?1323 06/26/21 ?06/28/21 06/27/21 ?06/29/21  ?WBC 22.4* 10.1 8.8  ? ?Microbiology ?No results found for this or any previous visit (from the past 240 hour(s)). ? ? ?Medications:  ? ? enoxaparin (LOVENOX) injection  40 mg Subcutaneous Q24H  ? folic acid  1 mg Oral Daily  ? multivitamin with minerals  1 tablet Oral Daily  ? nicotine  21 mg Transdermal Daily  ? thiamine  100 mg Oral Daily  ? Or  ? thiamine  100 mg Intravenous Daily  ? ?Continuous Infusions: ? sodium chloride 150 mL/hr at 06/29/21 0257  ? ? ? ? LOS: 4 days  ? ?07/01/21 Darin Engels  ?[facility] ? ?06/29/2021, 9:57 AM  ? ? ? ? ? ? ?

## 2021-06-29 NOTE — TOC Initial Note (Addendum)
Transition of Care (TOC) - Initial/Assessment Note  ? ? ?Patient Details  ?Name: Cassidy Underwood ?MRN: [id number] ?Date of Birth: April 05, 1995 ? ?Transition of Care (TOC) CM/SW Contact:    ?[place], LCSW ?Phone Number: ?06/29/2021, 12:30 PM ? ?Clinical Narrative:                 ? ?CSW met with pt to address homelessness and substance use. Pt states she lives in a tent with her boyfriend. She initially expresses interest in assistance with somewhere to stay but when CSW discusses shelter options she explains that she does not want to go to a shelter. She is familiar with the shelters, [facility], and other homeless resources and states she does not like them. CSW explains long-term housing is an extensive process that often takes months that she would need to follow up with an outside agency for. CSW addresses substance use and possible residential treatment. Pt states she snorts heroin every few weeks in order to come down from crack-cocaine. She reports her main issue is crack-cocaine use and that she has never had treatment or counseling for substance use. Pt and boyfriend receive narcan from outreach workers who go to homeless camps. CSW explains treatment options which pt does not seem interested. She is ambivalent about resources that would actually be available and seems most interested in permanent house or staying temporarily in a private home or apartment. CSW offers to make a referral for residential substance use treatment or shelters which pt refuses. She states she would think about  the residential treatment. CSW explains that other option would be for pt to identify family or friends to stay with. CSW asked about pt's mom which pt states lives in [place] but pt indicates that she is ambivalent about asking her mom. She mentions a friend named Hessie Diener who may be able to help with a hotel for a week. She will try to reach family/friends and figure something out but states room phone doesn't work. CSW  notified RN who will address room phone. CSW provided substance use resource list and discussed it with pt. Will follow up with further homelessness resources, permanent housing, and [facility] phone number for narcan delivery.  ? ?Pt left AMA before CSW able to follow up.  ? ? ?Expected Discharge Plan:  (Homeless; lives in tent and does not want to go to a shelter) ?Barriers to Discharge: Continued Medical Work up ? ? ?Patient Goals and CMS Choice ?  ?  ?  ? ?Expected Discharge Plan and Services ?Expected Discharge Plan:  (Homeless; lives in tent and does not want to go to a shelter) ?  ?  ?  ?Living arrangements for the past 2 months: Homeless ?                ?  ?  ?  ?  ?  ?  ?  ?  ?  ?  ? ?Prior Living Arrangements/Services ?Living arrangements for the past 2 months: Homeless ?Lives with:: Significant Other ?  ?       ?  ?  ?  ?  ? ?Activities of Daily Living ?Home Assistive Devices/Equipment: None ?ADL Screening (condition at time of admission) ?Patient's cognitive ability adequate to safely complete daily activities?: Yes ?Is the patient deaf or have difficulty hearing?: Yes ?Does the patient have difficulty seeing, even when wearing glasses/contacts?: No ?Does the patient have difficulty concentrating, remembering, or making decisions?: No ?Patient able to express need for  assistance with ADLs?: Yes ?Does the patient have difficulty dressing or bathing?: No ?Independently performs ADLs?: Yes (appropriate for developmental age) ?Does the patient have difficulty walking or climbing stairs?: No ?Weakness of Legs: None ?Weakness of Arms/Hands: None ? ?Permission Sought/Granted ?  ?  ?   ?   ?   ?   ? ?Emotional Assessment ?Appearance:: Appears older than stated age ?Attitude/Demeanor/Rapport: Apprehensive ?Affect (typically observed): Appropriate ?Orientation: : Oriented to Self, Oriented to Place, Oriented to  Time, Oriented to Situation ?  ?Psych Involvement: Yes (comment) ? ?Admission diagnosis:  Rhabdomyolysis  [M62.82] ?AKI (acute kidney injury) ([place]) [N17.9] ?Accidental overdose of heroin, initial encounter ([place]) [T40.1X1A] ?Patient Active Problem List  ? Diagnosis Date Noted  ? Rhabdomyolysis 06/25/2021  ? Drug overdose, accidental or unintentional, initial encounter 06/25/2021  ? AKI (acute kidney injury) ([place]) 06/25/2021  ? Leukocytosis 06/25/2021  ? Elevated LFTs 06/25/2021  ? Hyperglycemia 06/25/2021  ? Hearing loss 06/25/2021  ? [redacted] weeks gestation of pregnancy 12/17/2018  ? Normal labor 12/16/2018  ? Substance induced mood disorder ([place])   ? Cocaine use disorder, severe, dependence ([place]) 02/22/2018  ? Major depressive disorder, single episode, severe without psychotic features ([place]) 02/22/2018  ? Major depressive disorder, single episode, severe without psychosis ([place]) 02/22/2018  ? Indication for care in labor or delivery 11/13/2017  ? Pregnancy with adoption planned 10/05/2017  ? IUGR (intrauterine growth restriction) affecting care of mother 10/05/2017  ? Supervision of high risk pregnancy, antepartum 08/02/2017  ? Polysubstance abuse ([place]) 07/29/2017  ? Homeless 11/02/2016  ? ?PCP:  [facility] ?Pharmacy:  No Pharmacies Listed ? ? ? ?Social Determinants of Health (SDOH) Interventions ?  ? ?Readmission Risk Interventions ?   ? View : No data to display.  ?  ?  ?  ? ? ? ?

## 2021-08-07 ENCOUNTER — Emergency Department (HOSPITAL_COMMUNITY)
Admission: EM | Admit: 2021-08-07 | Discharge: 2021-08-07 | Disposition: A | Discharge status: Home / Self Care | Payer: Medicaid Other | Attending: Emergency Medicine | Admitting: Emergency Medicine

## 2021-08-07 DIAGNOSIS — M79642 Pain in left hand: Secondary | ICD-10-CM | POA: Diagnosis not present

## 2021-08-07 DIAGNOSIS — F1490 Cocaine use, unspecified, uncomplicated: Secondary | ICD-10-CM | POA: Diagnosis present

## 2021-08-07 DIAGNOSIS — Z5900 Homelessness unspecified: Secondary | ICD-10-CM | POA: Insufficient documentation

## 2021-08-07 LAB — COMPREHENSIVE METABOLIC PANEL
ALT: 28 U/L (ref 0–44)
AST: 29 U/L (ref 15–41)
Albumin: 4.3 g/dL (ref 3.5–5.0)
Alkaline Phosphatase: 51 U/L (ref 38–126)
Anion gap: 8 (ref 5–15)
BUN: 9 mg/dL (ref 6–20)
CO2: 23 mmol/L (ref 22–32)
Calcium: 9.4 mg/dL (ref 8.9–10.3)
Chloride: 106 mmol/L (ref 98–111)
Creatinine, Ser: 0.56 mg/dL (ref 0.44–1.00)
GFR, Estimated: >60 mL/min (ref >60)
Glucose, Bld: 123 mg/dL — ABNORMAL HIGH (ref 70–99)
Potassium: 3.5 mmol/L (ref 3.5–5.1)
Sodium: 137 mmol/L (ref 135–145)
Total Bilirubin: 0.9 mg/dL (ref 0.3–1.2)
Total Protein: 7.9 g/dL (ref 6.5–8.1)

## 2021-08-07 LAB — CBC WITH DIFFERENTIAL/PLATELET
Abs Immature Granulocytes: 0.03 10*3/uL (ref 0.00–0.07)
Basophils Absolute: 0 10*3/uL (ref 0.0–0.1)
Basophils Relative: 0 %
Eosinophils Absolute: 0.1 10*3/uL (ref 0.0–0.5)
Eosinophils Relative: 1 %
HCT: 38.5 % (ref 36.0–46.0)
Hemoglobin: 12.9 g/dL (ref 12.0–15.0)
Immature Granulocytes: 0 %
Lymphocytes Relative: 21 %
Lymphs Abs: 1.5 10*3/uL (ref 0.7–4.0)
MCH: 30.1 pg (ref 26.0–34.0)
MCHC: 33.5 g/dL (ref 30.0–36.0)
MCV: 89.7 fL (ref 80.0–100.0)
Monocytes Absolute: 0.4 10*3/uL (ref 0.1–1.0)
Monocytes Relative: 5 %
Neutro Abs: 5.1 10*3/uL (ref 1.7–7.7)
Neutrophils Relative %: 73 %
Platelets: 321 10*3/uL (ref 150–400)
RBC: 4.29 MIL/uL (ref 3.87–5.11)
RDW: 12.6 % (ref 11.5–15.5)
WBC: 7 10*3/uL (ref 4.0–10.5)
nRBC: 0 % (ref 0.0–0.2)

## 2021-08-07 LAB — CK: Total CK: 193 U/L (ref 38–234)

## 2021-08-07 MED ORDER — LORAZEPAM 1 MG PO TABS
1.0000 mg | ORAL_TABLET | Freq: Once | ORAL | Status: AC
  Administered 2021-08-07: 1 mg via ORAL
  Filled 2021-08-07: qty 1

## 2021-08-07 NOTE — ED Triage Notes (Signed)
Pt arrives via [facility]. Pt reports that she is homeless, and was recently seen in ED for rhabdomylosis, but left AMA due to withdrawal symptoms and time. Pt reports recent use of cocaine and heroin. Pain greatest in L arm. Denies N/V.

## 2021-08-10 ENCOUNTER — Ambulatory Visit: Payer: Self-pay

## 2021-08-10 NOTE — Telephone Encounter (Signed)
    Chief Complaint: Nexplanon implant left arm is swollen, very painful Symptoms: Above Frequency: Several weeks Pertinent Negatives: Patient denies fever Disposition: [x] ED /[] Urgent Care (no appt availability in office) / [] Appointment(In office/virtual)/ []  [facility]/ [] Home Care/ [] Refused Recommended Disposition /[] [facility]/ []  Follow-up with PCP Additional Notes:   Reason for Disposition  Patient sounds very sick or weak to the triager  Answer Assessment - Initial Assessment Questions 1. IMPLANT TYPE: "What type of implant are you using?"  (e.g., Jadelle, Nexplanon, Norplant)      Nexplanon 2. IMPLANT START DATE: "When did you first start using the implant?" (e.g., date; weeks, months, years ago)      2 years 3. IMPLANT LOCATION: "Where is implant located?" (e.g., abdomen, arm; left or right)     Left upper arm 4. SYMPTOM: "What is the main symptom (or question) you're concerned about?"     Swelling, pain 5. ONSET: "When did the pain start?"     Few weeks 6. VAGINAL BLEEDING: "Are you having any unusual vaginal bleeding?"   - NONE: no bleeding   - SPOTTING: spotting, or pinkish / brownish mucous discharge; does not fill panty liner or pad    - MILD:  less than 1 pad / hour; less than patient's usual menstrual bleeding   - MODERATE: 1-2 pads / hour; 1 menstrual cup every 6 hours; small-medium blood clots (e.g., pea, grape, small coin)   - SEVERE: soaking 2 or more pads/hour for 2 or more hours; 1 menstrual cup every 2 hours; bleeding not contained by pads or continuous red blood from vagina; large blood clots (e.g., golf ball, large coin)      No 7. ABDOMEN OR PELVIC PAIN: "Are you having any pain in your abdomen or pelvic area?" (Scale: 0, 1-10; none, mild, moderate, severe)   - NONE (0): no pain   - MILD (1-3): doesn't interfere with normal activities, abdomen soft and not tender to touch    - MODERATE (4-7): interferes with normal activities or  awakens from sleep, abdomen tender to touch    - SEVERE (8-10): excruciating pain, doubled over, unable to do any normal activities      No 8. PREGNANCY: "Are you concerned you might be pregnant?" "When was your last menstrual  period?"     No  Protocols used: Contraception - Implant Symptoms and Questions-A-AH

## 2022-08-05 ENCOUNTER — Other Ambulatory Visit: Payer: Self-pay

## 2022-08-05 ENCOUNTER — Emergency Department (HOSPITAL_COMMUNITY)
Admission: EM | Admit: 2022-08-05 | Discharge: 2022-08-05 | Disposition: A | Discharge status: Home / Self Care | Payer: Medicaid Other | Attending: Emergency Medicine | Admitting: Emergency Medicine

## 2022-08-05 DIAGNOSIS — H9203 Otalgia, bilateral: Secondary | ICD-10-CM | POA: Insufficient documentation

## 2022-08-05 DIAGNOSIS — H5713 Ocular pain, bilateral: Secondary | ICD-10-CM

## 2022-08-05 MED ORDER — BUPRENORPHINE HCL-NALOXONE HCL 8-2 MG SL SUBL
2.0000 | SUBLINGUAL_TABLET | Freq: Every day | SUBLINGUAL | Status: DC
  Filled 2022-08-05: qty 2
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
## 2022-08-13 ENCOUNTER — Emergency Department (HOSPITAL_COMMUNITY): Payer: Self-pay

## 2022-08-13 ENCOUNTER — Other Ambulatory Visit: Payer: Self-pay

## 2022-08-13 ENCOUNTER — Emergency Department (HOSPITAL_COMMUNITY)
Admission: EM | Admit: 2022-08-13 | Discharge: 2022-08-13 | Disposition: A | Discharge status: Home / Self Care | Payer: Self-pay | Attending: Emergency Medicine | Admitting: Emergency Medicine

## 2022-08-13 ENCOUNTER — Encounter (HOSPITAL_COMMUNITY): Payer: Self-pay

## 2022-08-13 DIAGNOSIS — E876 Hypokalemia: Secondary | ICD-10-CM | POA: Insufficient documentation

## 2022-08-13 DIAGNOSIS — Z5900 Homelessness unspecified: Secondary | ICD-10-CM | POA: Insufficient documentation

## 2022-08-13 DIAGNOSIS — T402X1A Poisoning by other opioids, accidental (unintentional), initial encounter: Secondary | ICD-10-CM | POA: Insufficient documentation

## 2022-08-13 DIAGNOSIS — T40601A Poisoning by unspecified narcotics, accidental (unintentional), initial encounter: Secondary | ICD-10-CM

## 2022-08-13 LAB — COMPREHENSIVE METABOLIC PANEL
ALT: 16 U/L (ref 0–44)
AST: 22 U/L (ref 15–41)
Albumin: 3.7 g/dL (ref 3.5–5.0)
Alkaline Phosphatase: 55 U/L (ref 38–126)
Anion gap: 9 (ref 5–15)
BUN: 14 mg/dL (ref 6–20)
CO2: 26 mmol/L (ref 22–32)
Calcium: 8.9 mg/dL (ref 8.9–10.3)
Chloride: 108 mmol/L (ref 98–111)
Creatinine, Ser: 0.83 mg/dL (ref 0.44–1.00)
GFR, Estimated: >60 mL/min (ref >60)
Glucose, Bld: 91 mg/dL (ref 70–99)
Potassium: 3.2 mmol/L — ABNORMAL LOW (ref 3.5–5.1)
Sodium: 143 mmol/L (ref 135–145)
Total Bilirubin: 0.6 mg/dL (ref 0.3–1.2)
Total Protein: 7.1 g/dL (ref 6.5–8.1)

## 2022-08-13 LAB — CBC WITH DIFFERENTIAL/PLATELET
Abs Immature Granulocytes: 0.03 10*3/uL (ref 0.00–0.07)
Basophils Absolute: 0 10*3/uL (ref 0.0–0.1)
Basophils Relative: 0 %
Eosinophils Absolute: 0.1 10*3/uL (ref 0.0–0.5)
Eosinophils Relative: 1 %
HCT: 35.4 % — ABNORMAL LOW (ref 36.0–46.0)
Hemoglobin: 11.9 g/dL — ABNORMAL LOW (ref 12.0–15.0)
Immature Granulocytes: 0 %
Lymphocytes Relative: 39 %
Lymphs Abs: 4.5 10*3/uL — ABNORMAL HIGH (ref 0.7–4.0)
MCH: 28.9 pg (ref 26.0–34.0)
MCHC: 33.6 g/dL (ref 30.0–36.0)
MCV: 85.9 fL (ref 80.0–100.0)
Monocytes Absolute: 0.7 10*3/uL (ref 0.1–1.0)
Monocytes Relative: 6 %
Neutro Abs: 6.2 10*3/uL (ref 1.7–7.7)
Neutrophils Relative %: 54 %
Platelets: 419 10*3/uL — ABNORMAL HIGH (ref 150–400)
RBC: 4.12 MIL/uL (ref 3.87–5.11)
RDW: 12.4 % (ref 11.5–15.5)
WBC: 11.5 10*3/uL — ABNORMAL HIGH (ref 4.0–10.5)
nRBC: 0 % (ref 0.0–0.2)

## 2022-08-13 LAB — CBG MONITORING, ED: Glucose-Capillary: 100 mg/dL — ABNORMAL HIGH (ref 70–99)

## 2022-08-13 LAB — HCG, SERUM, QUALITATIVE: Preg, Serum: NEGATIVE

## 2022-08-13 LAB — ETHANOL: Alcohol, Ethyl (B): <10 mg/dL (ref <10)

## 2022-08-13 LAB — SALICYLATE LEVEL: Salicylate Lvl: <7 mg/dL — ABNORMAL LOW (ref 7.0–30.0)

## 2022-08-13 LAB — ACETAMINOPHEN LEVEL: Acetaminophen (Tylenol), Serum: <10 ug/mL — ABNORMAL LOW (ref 10–30)

## 2022-08-13 MED ORDER — SODIUM CHLORIDE 0.9 % IV SOLN: INTRAVENOUS | Status: DC

## 2022-08-13 MED ORDER — SODIUM CHLORIDE 0.9 % IV BOLUS
1000.0000 mL | Freq: Once | INTRAVENOUS | Status: AC
  Administered 2022-08-13: 1000 mL via INTRAVENOUS

## 2022-08-13 NOTE — ED Notes (Signed)
Attempted to contact patient's friend Arline Asp per patient request: no answer
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
## 2023-03-28 ENCOUNTER — Emergency Department (HOSPITAL_COMMUNITY): Payer: Self-pay

## 2023-03-28 ENCOUNTER — Other Ambulatory Visit (HOSPITAL_COMMUNITY): Payer: Self-pay

## 2023-03-28 ENCOUNTER — Emergency Department (HOSPITAL_COMMUNITY)
Admission: EM | Admit: 2023-03-28 | Discharge: 2023-03-28 | Disposition: A | Discharge status: Home / Self Care | Payer: Self-pay | Attending: Emergency Medicine | Admitting: Emergency Medicine

## 2023-03-28 ENCOUNTER — Other Ambulatory Visit: Payer: Self-pay

## 2023-03-28 ENCOUNTER — Encounter (HOSPITAL_COMMUNITY): Payer: Self-pay

## 2023-03-28 DIAGNOSIS — J45909 Unspecified asthma, uncomplicated: Secondary | ICD-10-CM | POA: Insufficient documentation

## 2023-03-28 DIAGNOSIS — N3000 Acute cystitis without hematuria: Secondary | ICD-10-CM | POA: Insufficient documentation

## 2023-03-28 DIAGNOSIS — Z79899 Other long term (current) drug therapy: Secondary | ICD-10-CM | POA: Insufficient documentation

## 2023-03-28 DIAGNOSIS — Z20822 Contact with and (suspected) exposure to covid-19: Secondary | ICD-10-CM | POA: Insufficient documentation

## 2023-03-28 DIAGNOSIS — I959 Hypotension, unspecified: Secondary | ICD-10-CM

## 2023-03-28 DIAGNOSIS — R6889 Other general symptoms and signs: Secondary | ICD-10-CM

## 2023-03-28 DIAGNOSIS — F1910 Other psychoactive substance abuse, uncomplicated: Secondary | ICD-10-CM | POA: Insufficient documentation

## 2023-03-28 DIAGNOSIS — E860 Dehydration: Secondary | ICD-10-CM

## 2023-03-28 DIAGNOSIS — D72829 Elevated white blood cell count, unspecified: Secondary | ICD-10-CM | POA: Insufficient documentation

## 2023-03-28 LAB — RAPID URINE DRUG SCREEN, HOSP PERFORMED
Amphetamines: POSITIVE — AB
Barbiturates: NOT DETECTED
Benzodiazepines: POSITIVE — AB
Cocaine: POSITIVE — AB
Opiates: NOT DETECTED
Tetrahydrocannabinol: NOT DETECTED

## 2023-03-28 LAB — URINALYSIS, W/ REFLEX TO CULTURE (INFECTION SUSPECTED)
Bilirubin Urine: NEGATIVE
Glucose, UA: NEGATIVE mg/dL
Hgb urine dipstick: NEGATIVE
Ketones, ur: NEGATIVE mg/dL
Nitrite: POSITIVE — AB
Protein, ur: 100 mg/dL — AB
Specific Gravity, Urine: 1.024 (ref 1.005–1.030)
WBC, UA: >50 WBC/hpf (ref 0–5)
pH: 8 (ref 5.0–8.0)

## 2023-03-28 LAB — CBC WITH DIFFERENTIAL/PLATELET
Abs Immature Granulocytes: 0.05 10*3/uL (ref 0.00–0.07)
Basophils Absolute: 0.1 10*3/uL (ref 0.0–0.1)
Basophils Relative: 0 %
Eosinophils Absolute: 0.4 10*3/uL (ref 0.0–0.5)
Eosinophils Relative: 3 %
HCT: 38.2 % (ref 36.0–46.0)
Hemoglobin: 13.2 g/dL (ref 12.0–15.0)
Immature Granulocytes: 0 %
Lymphocytes Relative: 54 %
Lymphs Abs: 6 10*3/uL — ABNORMAL HIGH (ref 0.7–4.0)
MCH: 29.4 pg (ref 26.0–34.0)
MCHC: 34.6 g/dL (ref 30.0–36.0)
MCV: 85.1 fL (ref 80.0–100.0)
Monocytes Absolute: 0.7 10*3/uL (ref 0.1–1.0)
Monocytes Relative: 6 %
Neutro Abs: 4.2 10*3/uL (ref 1.7–7.7)
Neutrophils Relative %: 37 %
Platelets: 448 10*3/uL — ABNORMAL HIGH (ref 150–400)
RBC: 4.49 MIL/uL (ref 3.87–5.11)
RDW: 11.6 % (ref 11.5–15.5)
WBC: 11.2 10*3/uL — ABNORMAL HIGH (ref 4.0–10.5)
nRBC: 0 % (ref 0.0–0.2)

## 2023-03-28 LAB — RESP PANEL BY RT-PCR (RSV, FLU A&B, COVID)  RVPGX2
Influenza A by PCR: NEGATIVE
Influenza B by PCR: NEGATIVE
Resp Syncytial Virus by PCR: NEGATIVE
SARS Coronavirus 2 by RT PCR: NEGATIVE

## 2023-03-28 LAB — I-STAT CG4 LACTIC ACID, ED
Lactic Acid, Venous: 2.8 mmol/L (ref 0.5–1.9)
Lactic Acid, Venous: 1 mmol/L (ref 0.5–1.9)

## 2023-03-28 LAB — CK: Total CK: 361 U/L — ABNORMAL HIGH (ref 38–234)

## 2023-03-28 LAB — BASIC METABOLIC PANEL
Anion gap: 10 (ref 5–15)
BUN: 22 mg/dL — ABNORMAL HIGH (ref 6–20)
CO2: 26 mmol/L (ref 22–32)
Calcium: 8.7 mg/dL — ABNORMAL LOW (ref 8.9–10.3)
Chloride: 98 mmol/L (ref 98–111)
Creatinine, Ser: 0.89 mg/dL (ref 0.44–1.00)
GFR, Estimated: >60 mL/min (ref >60)
Glucose, Bld: 111 mg/dL — ABNORMAL HIGH (ref 70–99)
Potassium: 3.8 mmol/L (ref 3.5–5.1)
Sodium: 134 mmol/L — ABNORMAL LOW (ref 135–145)

## 2023-03-28 LAB — TROPONIN I (HIGH SENSITIVITY): Troponin I (High Sensitivity): <2 ng/L (ref <18)

## 2023-03-28 LAB — PREGNANCY, URINE: Preg Test, Ur: NEGATIVE

## 2023-03-28 MED ORDER — FOSFOMYCIN TROMETHAMINE 3 G PO PACK: 3.0000 g | PACK | Freq: Once | ORAL | Status: DC

## 2023-03-28 MED ORDER — CHLORDIAZEPOXIDE HCL 25 MG PO CAPS
ORAL_CAPSULE | ORAL | 0 refills | Status: DC
  Filled 2023-03-28: qty 12, 3d supply, fill #0

## 2023-03-28 MED ORDER — BUPRENORPHINE HCL-NALOXONE HCL 8-2 MG SL FILM
8.0000 mg | ORAL_FILM | Freq: Every day | SUBLINGUAL | 0 refills | Status: DC
Start: 2023-03-28 — End: 2023-03-30
  Filled 2023-03-28: qty 5, 5d supply, fill #0

## 2023-03-28 MED ORDER — SODIUM CHLORIDE 0.9 % IV SOLN
1.0000 g | Freq: Once | INTRAVENOUS | Status: AC
  Administered 2023-03-28: 1 g via INTRAVENOUS
  Filled 2023-03-28: qty 10

## 2023-03-28 MED ORDER — CLONIDINE HCL 0.1 MG PO TABS
0.1000 mg | ORAL_TABLET | Freq: Once | ORAL | Status: DC
  Filled 2023-03-28: qty 1

## 2023-03-28 MED ORDER — KETOROLAC TROMETHAMINE 15 MG/ML IJ SOLN
15.0000 mg | Freq: Once | INTRAMUSCULAR | Status: AC
  Administered 2023-03-28: 15 mg via INTRAVENOUS
  Filled 2023-03-28: qty 1

## 2023-03-28 MED ORDER — LACTATED RINGERS IV BOLUS
1000.0000 mL | Freq: Once | INTRAVENOUS | Status: AC
  Administered 2023-03-28: 1000 mL via INTRAVENOUS

## 2023-03-28 MED ORDER — CEPHALEXIN 500 MG PO CAPS
500.0000 mg | ORAL_CAPSULE | Freq: Four times a day (QID) | ORAL | 0 refills | Status: DC
  Filled 2023-03-28: qty 20, 5d supply, fill #0

## 2023-03-28 MED ORDER — LACTATED RINGERS IV BOLUS
500.0000 mL | Freq: Once | INTRAVENOUS | Status: AC
  Administered 2023-03-28: 500 mL via INTRAVENOUS

## 2023-03-28 MED ORDER — LORAZEPAM 1 MG PO TABS
1.0000 mg | ORAL_TABLET | Freq: Once | ORAL | Status: AC
Start: 2023-03-28 — End: 2023-03-28
  Administered 2023-03-28: 1 mg via ORAL
  Filled 2023-03-28: qty 1

## 2023-03-28 MED ORDER — BUPRENORPHINE HCL-NALOXONE HCL 8-2 MG SL SUBL
2.0000 | SUBLINGUAL_TABLET | Freq: Every day | SUBLINGUAL | Status: DC
  Administered 2023-03-28: 2 via SUBLINGUAL
  Filled 2023-03-28 (×2): qty 2

## 2023-03-28 MED ORDER — LORAZEPAM 2 MG/ML IJ SOLN
0.5000 mg | Freq: Once | INTRAMUSCULAR | Status: AC
  Administered 2023-03-28: 0.5 mg via INTRAVENOUS
  Filled 2023-03-28: qty 1

## 2023-03-28 MED ORDER — SODIUM CHLORIDE 0.9 % IV BOLUS
1000.0000 mL | Freq: Once | INTRAVENOUS | Status: AC
  Administered 2023-03-28: 1000 mL via INTRAVENOUS

## 2023-03-28 MED ORDER — CEPHALEXIN 500 MG PO CAPS: 500.0000 mg | ORAL_CAPSULE | Freq: Once | ORAL | Status: DC

## 2023-03-28 NOTE — ED Provider Notes (Signed)
  Physical Exam  BP 135/71   Pulse 80   Temp 98.3 F (36.8 C)   Resp 20   Ht 4\' 10"  (1.473 m)   Wt 44.5 kg   SpO2 100%   BMI 20.48 kg/m   Physical Exam Vitals and nursing note reviewed.  Constitutional:      General: She is not in acute distress.    Appearance: She is well-developed.  HENT:     Head: Normocephalic and atraumatic.  Eyes:     Conjunctiva/sclera: Conjunctivae normal.  Pulmonary:     Effort: Pulmonary effort is normal. No respiratory distress.  Musculoskeletal:        General: No swelling.     Cervical back: Neck supple.  Skin:    General: Skin is warm and dry.     Capillary Refill: Capillary refill takes less than 2 seconds.  Neurological:     Mental Status: She is alert.  Psychiatric:        Mood and Affect: Mood normal.     Procedures  Procedures  ED Course / MDM   Clinical Course as of 03/28/23 1706  Wed Mar 28, 2023  0153 Nitrite(!): POSITIVE [JL]  0153 Glori Luis): MODERATE [JL]  0153 WBC, UA: >50 [JL]  0153 Bacteria, UA(!): MANY [JL]  0716 [phone] Jada. [WF]    Clinical Course User Index [JL] Ernie Avena, MD [WF] Gailen Shelter, [place]   Medical Decision Making Amount and/or Complexity of Data Reviewed Labs: ordered. Decision-making details documented in ED Course. Radiology: ordered.  Risk Prescription drug management.   Patient received in handoff.  UTI withdrawing from multiple substances.  Pending completion of laboratory evaluation.  Patient with elevated lactic acid 2.8.  Resuscitated and elevated lactic acid improved to normal.  Given multiple medications for overall loading Suboxone and Ativan.  Vitals improved and patient is resting comfortably.  However, on my reevaluation when I awaken the patient and inform her she will likely will not be admitted to the hospital, she immediately complains of significant unrelenting chills and nausea.  I had an extensive discussion with the patient about outpatient options for  opioid withdrawal and ultimately we will discharge the patient on both Librium and Suboxone.  She will need to follow-up outpatient to seek substance abuse resources.  Patient discharged on antibiotics.  On second reevaluation, nursing staff found the patient to be actively smoking marijuana in the room.  Patient was escorted out of the emergency department       Glendora Score, MD 03/28/23 [phone]

## 2023-03-28 NOTE — ED Notes (Addendum)
Pt unable to sit still and was lying to her side during vitals

## 2023-03-28 NOTE — ED Notes (Signed)
Pt placed in discharge mode pending talking to case management in the am.

## 2023-03-28 NOTE — ED Notes (Signed)
Blood pressure is 76/50 and is now receiving iv fluids.
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
## 2023-03-28 NOTE — ED Triage Notes (Signed)
Pt is wanting to go to rehab. Said she has been taking ketamine, cocaine, heroine with fentanyl in it today. Wants to get clean. Is alert and oriented. No drowsiness is steady on feet.

## 2023-03-28 NOTE — Progress Notes (Addendum)
TOC consulted for homeless. Per chart review, pt is interested in substance abuse rehab. Substance abuse resources for Adults were already attached to pt's AVS. This Clinical research associate provided shelter resources. Pt may receive bus passes for transportation at time of discharge. EDP and RN aware. No additional TOC needs. TOC signing off.
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
## 2023-03-29 ENCOUNTER — Ambulatory Visit (HOSPITAL_COMMUNITY)
Admission: EM | Admit: 2023-03-29 | Discharge: 2023-03-29 | Disposition: A | Discharge status: Other Acute Inpatient Hospital | Payer: No Payment, Other | Attending: Psychiatry | Admitting: Psychiatry

## 2023-03-29 ENCOUNTER — Other Ambulatory Visit (HOSPITAL_COMMUNITY)
Admission: EM | Admit: 2023-03-29 | Discharge: 2023-04-02 | Disposition: A | Discharge status: Home / Self Care | Payer: No Payment, Other | Attending: Psychiatry | Admitting: Psychiatry

## 2023-03-29 DIAGNOSIS — Z5900 Homelessness unspecified: Secondary | ICD-10-CM | POA: Diagnosis not present

## 2023-03-29 DIAGNOSIS — R42 Dizziness and giddiness: Secondary | ICD-10-CM | POA: Insufficient documentation

## 2023-03-29 DIAGNOSIS — F4310 Post-traumatic stress disorder, unspecified: Secondary | ICD-10-CM | POA: Insufficient documentation

## 2023-03-29 DIAGNOSIS — F1910 Other psychoactive substance abuse, uncomplicated: Secondary | ICD-10-CM | POA: Diagnosis not present

## 2023-03-29 DIAGNOSIS — F1410 Cocaine abuse, uncomplicated: Secondary | ICD-10-CM | POA: Diagnosis not present

## 2023-03-29 DIAGNOSIS — F1110 Opioid abuse, uncomplicated: Secondary | ICD-10-CM | POA: Insufficient documentation

## 2023-03-29 DIAGNOSIS — F319 Bipolar disorder, unspecified: Secondary | ICD-10-CM | POA: Insufficient documentation

## 2023-03-29 DIAGNOSIS — F339 Major depressive disorder, recurrent, unspecified: Secondary | ICD-10-CM | POA: Diagnosis not present

## 2023-03-29 LAB — CBC WITH DIFFERENTIAL/PLATELET
Abs Immature Granulocytes: 0.04 10*3/uL (ref 0.00–0.07)
Basophils Absolute: 0 10*3/uL (ref 0.0–0.1)
Basophils Relative: 0 %
Eosinophils Absolute: 0.1 10*3/uL (ref 0.0–0.5)
Eosinophils Relative: 1 %
HCT: 39.5 % (ref 36.0–46.0)
Hemoglobin: 13.2 g/dL (ref 12.0–15.0)
Immature Granulocytes: 0 %
Lymphocytes Relative: 41 %
Lymphs Abs: 4.1 10*3/uL — ABNORMAL HIGH (ref 0.7–4.0)
MCH: 28.6 pg (ref 26.0–34.0)
MCHC: 33.4 g/dL (ref 30.0–36.0)
MCV: 85.5 fL (ref 80.0–100.0)
Monocytes Absolute: 0.5 10*3/uL (ref 0.1–1.0)
Monocytes Relative: 5 %
Neutro Abs: 5.2 10*3/uL (ref 1.7–7.7)
Neutrophils Relative %: 53 %
Platelets: 407 10*3/uL — ABNORMAL HIGH (ref 150–400)
RBC: 4.62 MIL/uL (ref 3.87–5.11)
RDW: 11.7 % (ref 11.5–15.5)
WBC: 10.1 10*3/uL (ref 4.0–10.5)
nRBC: 0 % (ref 0.0–0.2)

## 2023-03-29 LAB — COMPREHENSIVE METABOLIC PANEL
ALT: 28 U/L (ref 0–44)
AST: 31 U/L (ref 15–41)
Albumin: 3.8 g/dL (ref 3.5–5.0)
Alkaline Phosphatase: 61 U/L (ref 38–126)
Anion gap: 10 (ref 5–15)
BUN: 13 mg/dL (ref 6–20)
CO2: 25 mmol/L (ref 22–32)
Calcium: 9 mg/dL (ref 8.9–10.3)
Chloride: 105 mmol/L (ref 98–111)
Creatinine, Ser: 0.75 mg/dL (ref 0.44–1.00)
GFR, Estimated: >60 mL/min (ref >60)
Glucose, Bld: 86 mg/dL (ref 70–99)
Potassium: 3.7 mmol/L (ref 3.5–5.1)
Sodium: 140 mmol/L (ref 135–145)
Total Bilirubin: 0.6 mg/dL (ref 0.0–1.2)
Total Protein: 6.7 g/dL (ref 6.5–8.1)

## 2023-03-29 LAB — POC URINE PREG, ED: Preg Test, Ur: NEGATIVE

## 2023-03-29 LAB — POCT URINE DRUG SCREEN - MANUAL ENTRY (I-SCREEN)
POC Amphetamine UR: NOT DETECTED
POC Buprenorphine (BUP): POSITIVE — AB
POC Cocaine UR: POSITIVE — AB
POC Marijuana UR: NOT DETECTED
POC Methadone UR: NOT DETECTED
POC Methamphetamine UR: NOT DETECTED
POC Morphine: NOT DETECTED
POC Oxazepam (BZO): POSITIVE — AB
POC Oxycodone UR: NOT DETECTED
POC Secobarbital (BAR): NOT DETECTED

## 2023-03-29 LAB — TSH: TSH: 0.378 u[IU]/mL (ref 0.350–4.500)

## 2023-03-29 LAB — ETHANOL: Alcohol, Ethyl (B): <10 mg/dL (ref <10)

## 2023-03-29 MED ORDER — CLONIDINE HCL 0.1 MG PO TABS
0.1000 mg | ORAL_TABLET | Freq: Four times a day (QID) | ORAL | Status: AC
  Administered 2023-03-29 – 2023-03-31 (×6): 0.1 mg via ORAL
  Filled 2023-03-29 (×8): qty 1

## 2023-03-29 MED ORDER — HYDROXYZINE HCL 25 MG PO TABS
25.0000 mg | ORAL_TABLET | Freq: Four times a day (QID) | ORAL | Status: DC | PRN
  Administered 2023-03-30: 25 mg via ORAL
  Filled 2023-03-29: qty 1

## 2023-03-29 MED ORDER — ONDANSETRON 4 MG PO TBDP: 4.0000 mg | ORAL_TABLET | Freq: Four times a day (QID) | ORAL | Status: DC | PRN

## 2023-03-29 MED ORDER — OLANZAPINE 10 MG IM SOLR: 5.0000 mg | Freq: Three times a day (TID) | INTRAMUSCULAR | Status: DC | PRN

## 2023-03-29 MED ORDER — CLONIDINE HCL 0.1 MG PO TABS: 0.1000 mg | ORAL_TABLET | Freq: Every day | ORAL | Status: DC

## 2023-03-29 MED ORDER — OLANZAPINE 10 MG IM SOLR: 10.0000 mg | Freq: Three times a day (TID) | INTRAMUSCULAR | Status: DC | PRN

## 2023-03-29 MED ORDER — DICYCLOMINE HCL 20 MG PO TABS
20.0000 mg | ORAL_TABLET | Freq: Four times a day (QID) | ORAL | Status: DC | PRN
  Administered 2023-03-30 – 2023-03-31 (×2): 20 mg via ORAL
  Filled 2023-03-29 (×2): qty 1

## 2023-03-29 MED ORDER — CLONIDINE HCL 0.1 MG PO TABS
0.1000 mg | ORAL_TABLET | ORAL | Status: DC
  Administered 2023-04-01 – 2023-04-02 (×3): 0.1 mg via ORAL
  Filled 2023-03-29 (×4): qty 1

## 2023-03-29 MED ORDER — OLANZAPINE 5 MG PO TBDP: 5.0000 mg | ORAL_TABLET | Freq: Three times a day (TID) | ORAL | Status: DC | PRN

## 2023-03-29 MED ORDER — METHOCARBAMOL 500 MG PO TABS: 500.0000 mg | ORAL_TABLET | Freq: Three times a day (TID) | ORAL | Status: DC | PRN

## 2023-03-29 MED ORDER — MAGNESIUM HYDROXIDE 400 MG/5ML PO SUSP: 30.0000 mL | Freq: Every day | ORAL | Status: DC | PRN

## 2023-03-29 MED ORDER — LOPERAMIDE HCL 2 MG PO CAPS: 2.0000 mg | ORAL_CAPSULE | ORAL | Status: DC | PRN

## 2023-03-29 MED ORDER — ACETAMINOPHEN 325 MG PO TABS: 650.0000 mg | ORAL_TABLET | Freq: Four times a day (QID) | ORAL | Status: DC | PRN

## 2023-03-29 MED ORDER — ALUM & MAG HYDROXIDE-SIMETH 200-200-20 MG/5ML PO SUSP: 30.0000 mL | ORAL | Status: DC | PRN

## 2023-03-29 MED ORDER — NAPROXEN 500 MG PO TABS
500.0000 mg | ORAL_TABLET | Freq: Two times a day (BID) | ORAL | Status: DC | PRN
  Administered 2023-03-30: 500 mg via ORAL
  Filled 2023-03-29: qty 1

## 2023-03-29 NOTE — ED Notes (Addendum)
Pt presents to [facility] to get help with cocaine and heroin. During skin assessment, writer noticed abrasion on pt chest from ECG electrodes and abrasion from blood works on both forearms of pt. Admission process completed. Pt has been provide with new clothes from the tracy closet , had her shower. Pt is oriented to the unit and provided with meal. . Pt denies HI/AVH. Will continue to monitor for safety and provide support.

## 2023-03-29 NOTE — ED Provider Notes (Signed)
 Facility Based Crisis Admission H&P  Date: 03/30/23 Patient Name: Cassidy Underwood [facility] MRN: [id number] Chief Complaint: wanting rehab from cocaine, heroin   Diagnoses:  Final diagnoses:  Polysubstance abuse (HCC)  Recurrent major depressive disorder, remission status unspecified (HCC)  Homelessness    HPI: Cassidy Underwood, 28 y/o female with a history of substance abuse (cocaine, heroin, opioid).  Per the patient she is currently homeless, and has been homeless for the last 8 years patient reported that she does hear when and crack cocaine on a daily basis and also track which is a form of hoarse tranquilizer.  According to patient she was hospitalized 4 years ago for the same diagnosis.  I review of patient records show multiple ED visits for cocaine and opioid abuse.  According to patient she is currently not taking any medication.  Not currently seeing a psychiatrist or therapist.  Face-to-face evaluation of patient, patient is alert and oriented x 4, speech is clear, maintain eye contact.  Patient very cooperative during the interview process.  Patient denies SI, HI, AVH or paranoia.  Reports she used crack cocaine and heroin prior to coming in and also on a daily basis.  Patient shows no sign of distress, patient denied wanting to hurt herself or others.  At this time patient does not seem to be influenced by internal or external stimuli.  Patient reports she has not tried rehab in a long time.  And is very much interested in getting her life together.  Writer discussed the requirements for FBC.  Patient in agreement with plan of care   Recommend inpatient FBC  PHQ-9 was completed patient scored a 15 (moderate severe depression) PHQ 2-9:  Flowsheet Row ED from 03/29/2023 in [facility]  Thoughts that you would be better off dead, or of hurting yourself in some way Several days  PHQ-9 Total Score 15       Flowsheet Row ED from 03/29/2023 in [facility] ED from 03/28/2023 in [facility] Department at [facility] ED from 08/13/2022 in [facility] Emergency Department at [facility]  C-SSRS RISK CATEGORY No Risk No Risk No Risk       Screenings    Flowsheet Row Most Recent Value  COWS Total Score 6       Total Time spent with patient: 30 minutes  Musculoskeletal  Strength & Muscle Tone: within normal limits Gait & Station: normal Patient leans: N/A  Psychiatric Specialty Exam  Presentation General Appearance:  Casual  Eye Contact: Good  Speech: Clear and Coherent  Speech Volume: Normal  Handedness: Right   Mood and Affect  Mood: Anxious; Depressed  Affect: Congruent   Thought Process  Thought Processes: Coherent  Descriptions of Associations:Intact  Orientation:Full (Time, Place and Person)  Thought Content:Logical  Diagnosis of Schizophrenia or Schizoaffective disorder in past: No   Hallucinations:Hallucinations: None  Ideas of Reference:None  Suicidal Thoughts:Suicidal Thoughts: No  Homicidal Thoughts:Homicidal Thoughts: No   Sensorium  Memory: Immediate Fair  Judgment: Fair  Insight: Fair   Art therapist  Concentration: Fair  Attention Span: Good  Recall: Good  Fund of Knowledge: Good  Language: Good   Psychomotor Activity  Psychomotor Activity: Psychomotor Activity: Normal   Assets  Assets: Desire for Improvement; Housing; Resilience; Social Support   Sleep  Sleep: Sleep: Fair Number of Hours of Sleep: 6   Nutritional Assessment (For OBS and FBC admissions only) Has the patient had a weight loss  or gain of 10 pounds or more in the last 3 months?: No Has the patient had a decrease in food intake/or appetite?: No Does the patient have dental problems?: No Does the patient have eating habits or behaviors that may be indicators of an eating disorder including binging or inducing vomiting?: No Has  the patient recently lost weight without trying?: 0 Has the patient been eating poorly because of a decreased appetite?: 0 Malnutrition Screening Tool Score: 0    Physical Exam HENT:     Head: Normocephalic.     Nose: Nose normal.  Eyes:     Pupils: Pupils are equal, round, and reactive to light.  Cardiovascular:     Rate and Rhythm: Normal rate.  Pulmonary:     Effort: Pulmonary effort is normal.  Musculoskeletal:        General: Normal range of motion.     Cervical back: Normal range of motion.  Neurological:     General: No focal deficit present.     Mental Status: She is alert.  Psychiatric:        Mood and Affect: Mood normal.        Behavior: Behavior normal.        Thought Content: Thought content normal.        Judgment: Judgment normal.    Review of Systems  Constitutional: Negative.   HENT: Negative.    Eyes: Negative.   Respiratory: Negative.    Cardiovascular: Negative.   Gastrointestinal: Negative.   Genitourinary: Negative.   Musculoskeletal: Negative.   Skin: Negative.   Neurological: Negative.   Psychiatric/Behavioral:  Positive for depression and substance abuse. The patient is nervous/anxious.     Blood pressure 127/75, pulse 100, temperature 98.5 F (36.9 C), temperature source Oral, resp. rate 19, SpO2 100%, unknown if currently breastfeeding. There is no height or weight on file to calculate BMI.  Past Psychiatric History: Opioid abuse, major depressive disorder, polysubstance abuse  Is the patient at risk to self? No  Has the patient been a risk to self in the past 6 months? No .    Has the patient been a risk to self within the distant past? No   Is the patient a risk to others? No   Has the patient been a risk to others in the past 6 months? No   Has the patient been a risk to others within the distant past? No   Past Medical History: see chart  Family History: unknown Social History: Crack cocaine, heroin, tranks  Last Labs:   Admission on 03/29/2023  Component Date Value Ref Range Status   WBC 03/29/2023 10.1  4.0 - 10.5 K/uL Final   RBC 03/29/2023 4.62  3.87 - 5.11 MIL/uL Final   Hemoglobin 03/29/2023 13.2  12.0 - 15.0 g/dL Final   HCT 40/98/1191 39.5  36.0 - 46.0 % Final   MCV 03/29/2023 85.5  80.0 - 100.0 fL Final   MCH 03/29/2023 28.6  26.0 - 34.0 pg Final   MCHC 03/29/2023 33.4  30.0 - 36.0 g/dL Final   RDW 47/82/9562 11.7  11.5 - 15.5 % Final   Platelets 03/29/2023 407 (H)  150 - 400 K/uL Final   nRBC 03/29/2023 0.0  0.0 - 0.2 % Final   Neutrophils Relative % 03/29/2023 53  % Final   Neutro Abs 03/29/2023 5.2  1.7 - 7.7 K/uL Final   Lymphocytes Relative 03/29/2023 41  % Final   Lymphs Abs 03/29/2023 4.1 (H)  0.7 -  4.0 K/uL Final   Monocytes Relative 03/29/2023 5  % Final   Monocytes Absolute 03/29/2023 0.5  0.1 - 1.0 K/uL Final   Eosinophils Relative 03/29/2023 1  % Final   Eosinophils Absolute 03/29/2023 0.1  0.0 - 0.5 K/uL Final   Basophils Relative 03/29/2023 0  % Final   Basophils Absolute 03/29/2023 0.0  0.0 - 0.1 K/uL Final   Immature Granulocytes 03/29/2023 0  % Final   Abs Immature Granulocytes 03/29/2023 0.04  0.00 - 0.07 K/uL Final   Performed at [facility], [address], [place], [place] [address]   Sodium 03/29/2023 140  135 - 145 mmol/L Final   Potassium 03/29/2023 3.7  3.5 - 5.1 mmol/L Final   Chloride 03/29/2023 105  98 - 111 mmol/L Final   CO2 03/29/2023 25  22 - 32 mmol/L Final   Glucose, Bld 03/29/2023 86  70 - 99 mg/dL Final   Glucose reference range applies only to samples taken after fasting for at least 8 hours.   BUN 03/29/2023 13  6 - 20 mg/dL Final   Creatinine, Ser 03/29/2023 0.75  0.44 - 1.00 mg/dL Final   Calcium 13/09/6576 9.0  8.9 - 10.3 mg/dL Final   Total Protein 46/96/2952 6.7  6.5 - 8.1 g/dL Final   Albumin 84/13/2440 3.8  3.5 - 5.0 g/dL Final   AST 12/10/2534 31  15 - 41 U/L Final   ALT 03/29/2023 28  0 - 44 U/L Final   Alkaline Phosphatase  03/29/2023 61  38 - 126 U/L Final   Total Bilirubin 03/29/2023 0.6  0.0 - 1.2 mg/dL Final   GFR, Estimated 03/29/2023 >60  >60 mL/min Final   Comment: (NOTE) Calculated using the CKD-EPI Creatinine Equation (2021)    Anion gap 03/29/2023 10  5 - 15 Final   Performed at [facility], [address] [place], [place] [address]   Alcohol, Ethyl (B) 03/29/2023 <10  <10 mg/dL Final   Comment: (NOTE) Lowest detectable limit for serum alcohol is 10 mg/dL.  For medical purposes only. Performed at [facility], [address], [place], [place] [address]    TSH 03/29/2023 0.378  0.350 - 4.500 uIU/mL Final   Comment: Performed by a 3rd Generation assay with a functional sensitivity of <=0.01 uIU/mL. Performed at [facility], [address] [place], [place] [address]    Preg Test, Ur 03/29/2023 Negative  Negative Final   POC Amphetamine UR 03/29/2023 None Detected  NONE DETECTED (Cut Off Level 1000 ng/mL) Final   POC Secobarbital (BAR) 03/29/2023 None Detected  NONE DETECTED (Cut Off Level 300 ng/mL) Final   POC Buprenorphine (BUP) 03/29/2023 Positive (A)  NONE DETECTED (Cut Off Level 10 ng/mL) Final   POC Oxazepam (BZO) 03/29/2023 Positive (A)  NONE DETECTED (Cut Off Level 300 ng/mL) Final   POC Cocaine UR 03/29/2023 Positive (A)  NONE DETECTED (Cut Off Level 300 ng/mL) Final   POC Methamphetamine UR 03/29/2023 None Detected  NONE DETECTED (Cut Off Level 1000 ng/mL) Final   POC Morphine 03/29/2023 None Detected  NONE DETECTED (Cut Off Level 300 ng/mL) Final   POC Methadone UR 03/29/2023 None Detected  NONE DETECTED (Cut Off Level 300 ng/mL) Final   POC Oxycodone UR 03/29/2023 None Detected  NONE DETECTED (Cut Off Level 100 ng/mL) Final   POC Marijuana UR 03/29/2023 None Detected  NONE DETECTED (Cut Off Level 50 ng/mL) Final  Admission on 03/28/2023, Discharged on 03/28/2023  Component  Date Value Ref Range Status   Total CK 03/28/2023 361 (H)  38 - 234 U/L Final   Performed  at [facility], [address], [place], [place] [address]   WBC 03/28/2023 11.2 (H)  4.0 - 10.5 K/uL Final   RBC 03/28/2023 4.49  3.87 - 5.11 MIL/uL Final   Hemoglobin 03/28/2023 13.2  12.0 - 15.0 g/dL Final   HCT 78/29/5621 38.2  36.0 - 46.0 % Final   MCV 03/28/2023 85.1  80.0 - 100.0 fL Final   MCH 03/28/2023 29.4  26.0 - 34.0 pg Final   MCHC 03/28/2023 34.6  30.0 - 36.0 g/dL Final   RDW 30/86/5784 11.6  11.5 - 15.5 % Final   Platelets 03/28/2023 448 (H)  150 - 400 K/uL Final   nRBC 03/28/2023 0.0  0.0 - 0.2 % Final   Neutrophils Relative % 03/28/2023 37  % Final   Neutro Abs 03/28/2023 4.2  1.7 - 7.7 K/uL Final   Lymphocytes Relative 03/28/2023 54  % Final   Lymphs Abs 03/28/2023 6.0 (H)  0.7 - 4.0 K/uL Final   Monocytes Relative 03/28/2023 6  % Final   Monocytes Absolute 03/28/2023 0.7  0.1 - 1.0 K/uL Final   Eosinophils Relative 03/28/2023 3  % Final   Eosinophils Absolute 03/28/2023 0.4  0.0 - 0.5 K/uL Final   Basophils Relative 03/28/2023 0  % Final   Basophils Absolute 03/28/2023 0.1  0.0 - 0.1 K/uL Final   Immature Granulocytes 03/28/2023 0  % Final   Abs Immature Granulocytes 03/28/2023 0.05  0.00 - 0.07 K/uL Final   Performed at [facility], [address], [place], [place] [address]   Sodium 03/28/2023 134 (L)  135 - 145 mmol/L Final   Potassium 03/28/2023 3.8  3.5 - 5.1 mmol/L Final   Chloride 03/28/2023 98  98 - 111 mmol/L Final   CO2 03/28/2023 26  22 - 32 mmol/L Final   Glucose, Bld 03/28/2023 111 (H)  70 - 99 mg/dL Final   Glucose reference range applies only to samples taken after fasting for at least 8 hours.   BUN 03/28/2023 22 (H)  6 - 20 mg/dL Final   Creatinine, Ser 03/28/2023 0.89  0.44 - 1.00 mg/dL Final   Calcium 52/84/1324 8.7 (L)  8.9 - 10.3 mg/dL Final   GFR, Estimated 03/28/2023 >60  >60 mL/min Final   Comment: (NOTE) Calculated using the CKD-EPI Creatinine Equation (2021)    Anion gap 03/28/2023 10  5 - 15  Final   Performed at [facility], [address] [place], [place] [address]   SARS Coronavirus 2 by RT PCR 03/28/2023 NEGATIVE  NEGATIVE Final   Comment: (NOTE) SARS-CoV-2 target nucleic acids are NOT DETECTED.  The SARS-CoV-2 RNA is generally detectable in upper respiratory specimens during the acute phase of infection. The lowest concentration of SARS-CoV-2 viral copies this assay can detect is 138 copies/mL. A negative result does not preclude SARS-Cov-2 infection and should not be used as the sole basis for treatment or other patient management decisions. A negative result may occur with  improper specimen collection/handling, submission of specimen other than nasopharyngeal swab, presence of viral mutation(s) within the areas targeted by this assay, and inadequate number of viral copies(<138 copies/mL). A negative result must be combined with clinical observations, patient history, and epidemiological information. The expected result is Negative.  Fact Sheet for Patients:  BloggerCourse.com  Fact Sheet for Healthcare Providers:  SeriousBroker.it  This  test is no                          t yet approved or cleared by the [place] and  has been authorized for detection and/or diagnosis of SARS-CoV-2 by FDA under an Emergency Use Authorization (EUA). This EUA will remain  in effect (meaning this test can be used) for the duration of the COVID-19 declaration under Section 564(b)(1) of the Act, 21 U.S.C.section 360bbb-3(b)(1), unless the authorization is terminated  or revoked sooner.       Influenza A by PCR 03/28/2023 NEGATIVE  NEGATIVE Final   Influenza B by PCR 03/28/2023 NEGATIVE  NEGATIVE Final   Comment: (NOTE) The Xpert Xpress SARS-CoV-2/FLU/RSV plus assay is intended as an aid in the diagnosis of influenza from Nasopharyngeal swab specimens and should not be used as a sole basis for  treatment. Nasal washings and aspirates are unacceptable for Xpert Xpress SARS-CoV-2/FLU/RSV testing.  Fact Sheet for Patients: BloggerCourse.com  Fact Sheet for Healthcare Providers: SeriousBroker.it  This test is not yet approved or cleared by the [place] FDA and has been authorized for detection and/or diagnosis of SARS-CoV-2 by FDA under an Emergency Use Authorization (EUA). This EUA will remain in effect (meaning this test can be used) for the duration of the COVID-19 declaration under Section 564(b)(1) of the Act, 21 U.S.C. section 360bbb-3(b)(1), unless the authorization is terminated or revoked.     Resp Syncytial Virus by PCR 03/28/2023 NEGATIVE  NEGATIVE Final   Comment: (NOTE) Fact Sheet for Patients: BloggerCourse.com  Fact Sheet for Healthcare Providers: SeriousBroker.it  This test is not yet approved or cleared by the [place] FDA and has been authorized for detection and/or diagnosis of SARS-CoV-2 by FDA under an Emergency Use Authorization (EUA). This EUA will remain in effect (meaning this test can be used) for the duration of the COVID-19 declaration under Section 564(b)(1) of the Act, 21 U.S.C. section 360bbb-3(b)(1), unless the authorization is terminated or revoked.  Performed at [facility], [address], [place], [place] [address]    Specimen Source 03/28/2023 URINE, CLEAN CATCH   Final   Color, Urine 03/28/2023 YELLOW  YELLOW Final   APPearance 03/28/2023 CLOUDY (A)  CLEAR Final   Specific Gravity, Urine 03/28/2023 1.024  1.005 - 1.030 Final   pH 03/28/2023 8.0  5.0 - 8.0 Final   Glucose, UA 03/28/2023 NEGATIVE  NEGATIVE mg/dL Final   Hgb urine dipstick 03/28/2023 NEGATIVE  NEGATIVE Final   Bilirubin Urine 03/28/2023 NEGATIVE  NEGATIVE Final   Ketones, ur 03/28/2023 NEGATIVE  NEGATIVE mg/dL Final   Protein, ur  13/09/6576 100 (A)  NEGATIVE mg/dL Final   Nitrite 46/96/2952 POSITIVE (A)  NEGATIVE Final   Leukocytes,Ua 03/28/2023 MODERATE (A)  NEGATIVE Final   RBC / HPF 03/28/2023 0-5  0 - 5 RBC/hpf Final   WBC, UA 03/28/2023 >50  0 - 5 WBC/hpf Final   Comment:        Reflex urine culture not performed if WBC <=10, OR if Squamous epithelial cells >5. If Squamous epithelial cells >5 suggest recollection.    Bacteria, UA 03/28/2023 MANY (A)  NONE SEEN Final   Squamous Epithelial / HPF 03/28/2023 0-5  0 - 5 /HPF Final   Mucus 03/28/2023 PRESENT   Final   Hyaline Casts, UA 03/28/2023 PRESENT   Final   Triple Phosphate Crystal 03/28/2023 PRESENT   Final   Performed at [facility],  [address] [place], [place] [address]   Opiates 03/28/2023 NONE DETECTED  NONE DETECTED Final   Cocaine 03/28/2023 POSITIVE (A)  NONE DETECTED Final   Benzodiazepines 03/28/2023 POSITIVE (A)  NONE DETECTED Final   Amphetamines 03/28/2023 POSITIVE (A)  NONE DETECTED Final   Tetrahydrocannabinol 03/28/2023 NONE DETECTED  NONE DETECTED Final   Barbiturates 03/28/2023 NONE DETECTED  NONE DETECTED Final   Comment: (NOTE) DRUG SCREEN FOR MEDICAL PURPOSES ONLY.  IF CONFIRMATION IS NEEDED FOR ANY PURPOSE, NOTIFY LAB WITHIN 5 DAYS.  LOWEST DETECTABLE LIMITS FOR URINE DRUG SCREEN Drug Class                     Cutoff (ng/mL) Amphetamine and metabolites    1000 Barbiturate and metabolites    200 Benzodiazepine                 200 Opiates and metabolites        300 Cocaine and metabolites        300 THC                            50 Performed at [facility], [address] [place], [place] [address]    Preg Test, Ur 03/28/2023 NEGATIVE  NEGATIVE Final   Comment:        THE SENSITIVITY OF THIS METHODOLOGY IS >25 mIU/mL. Performed at [facility], [address], [place], [place] [address]    Specimen Description 03/28/2023    Final                    Value:URINE, RANDOM Performed at [facility], [address] [place], [place] [address]    Special Requests 03/28/2023    Final                   Value:NONE Reflexed from [id number] Performed at [facility], [address], [place], [place] [address]    Culture 03/28/2023  (A)   Final                   Value:>=100,000 COLONIES/mL PROTEUS MIRABILIS SUSCEPTIBILITIES TO FOLLOW Performed at [facility], [address], [place], [place] [address]    Report Status 03/28/2023 PENDING   Incomplete   Specimen Description 03/28/2023    Final                   Value:BLOOD RIGHT ANTECUBITAL Performed at [facility], [address] [place], [place] [address]    Special Requests 03/28/2023    Final                   Value:BOTTLES DRAWN AEROBIC AND ANAEROBIC Blood Culture adequate volume Performed at [facility], [address] [place], [place] [address]    Culture 03/28/2023    Final                   Value:NO GROWTH < 24 HOURS Performed at [facility], [address] [place], [place] [address]    Report Status 03/28/2023 PENDING   Incomplete   Specimen Description 03/28/2023    Final                   Value:BLOOD LEFT ARM Performed at [facility], [address], [place], [place] [address]  Special Requests 03/28/2023    Final                   Value:BOTTLES DRAWN AEROBIC AND ANAEROBIC Blood Culture results may not be optimal due to an inadequate volume of blood received in culture bottles Performed at [facility], [address], [place], [place] [address]    Culture 03/28/2023    Final                   Value:NO GROWTH < 24 HOURS Performed at [facility], [address] [place], [place] [address]    Report Status 03/28/2023 PENDING   Incomplete   Lactic Acid, Venous 03/28/2023 2.8 (HH)  0.5 - 1.9 mmol/L Final   Comment 03/28/2023 NOTIFIED PHYSICIAN   Final    Troponin I (High Sensitivity) 03/28/2023 <2  <18 ng/L Final   Comment: (NOTE) Elevated high sensitivity troponin I (hsTnI) values and significant  changes across serial measurements may suggest ACS but many other  chronic and acute conditions are known to elevate hsTnI results.  Refer to the "Links" section for chest pain algorithms and additional  guidance. Performed at [facility], [address] [place], [place] [address]    Lactic Acid, Venous 03/28/2023 1.0  0.5 - 1.9 mmol/L Final    Allergies: Patient has no known allergies.  Medications:  Facility Ordered Medications  Medication   acetaminophen (TYLENOL) tablet 650 mg   alum & mag hydroxide-simeth (MAALOX/MYLANTA) 200-200-20 MG/5ML suspension 30 mL   magnesium hydroxide (MILK OF MAGNESIA) suspension 30 mL   dicyclomine (BENTYL) tablet 20 mg   hydrOXYzine (ATARAX) tablet 25 mg   loperamide (IMODIUM) capsule 2-4 mg   methocarbamol (ROBAXIN) tablet 500 mg   naproxen (NAPROSYN) tablet 500 mg   ondansetron (ZOFRAN-ODT) disintegrating tablet 4 mg   cloNIDine (CATAPRES) tablet 0.1 mg   Followed by   Melene Muller ON 03/31/2023] cloNIDine (CATAPRES) tablet 0.1 mg   Followed by   Melene Muller ON 04/03/2023] cloNIDine (CATAPRES) tablet 0.1 mg   OLANZapine zydis (ZYPREXA) disintegrating tablet 5 mg   OLANZapine (ZYPREXA) injection 5 mg   OLANZapine (ZYPREXA) injection 10 mg   traZODone (DESYREL) tablet 50 mg   PTA Medications  Medication Sig   cephALEXin (KEFLEX) 500 MG capsule Take 1 capsule (500 mg total) by mouth 4 (four) times daily.   chlordiazePOXIDE (LIBRIUM) 25 MG capsule Take two capsules by mouth three times daily for one day, then one to two capsules twice daily for one day, then one to two capsules once daily for one day.   Buprenorphine HCl-Naloxone HCl (SUBOXONE) 8-2 MG FILM Place 1 Film under the tongue daily.    Long Term Goals: Improvement in symptoms so as ready for discharge  Short Term Goals:  Patient will verbalize feelings in meetings with treatment team members., Patient will attend at least of 50% of the groups daily., Pt will complete the PHQ9 on admission, day 3 and discharge., Patient will participate in completing the [place] Suicide Severity Rating Scale, Patient will score a low risk of violence for 24 hours prior to discharge, and Patient will take medications as prescribed daily.  Medical Decision Making  Inpatient FBC  Lab Orders         SARS Coronavirus 2 by RT PCR (hospital order, performed in [facility] hospital lab) *cepheid single result test* Anterior Nasal Swab         CBC with Differential/Platelet  Comprehensive metabolic panel         Ethanol         TSH         POC urine preg, ED         POCT Urine Drug Screen - (I-Screen)      Meds ordered this encounter  Medications   acetaminophen (TYLENOL) tablet 650 mg   alum & mag hydroxide-simeth (MAALOX/MYLANTA) 200-200-20 MG/5ML suspension 30 mL   magnesium hydroxide (MILK OF MAGNESIA) suspension 30 mL   dicyclomine (BENTYL) tablet 20 mg   hydrOXYzine (ATARAX) tablet 25 mg   loperamide (IMODIUM) capsule 2-4 mg   methocarbamol (ROBAXIN) tablet 500 mg   naproxen (NAPROSYN) tablet 500 mg   ondansetron (ZOFRAN-ODT) disintegrating tablet 4 mg   FOLLOWED BY Linked Order Group    cloNIDine (CATAPRES) tablet 0.1 mg    cloNIDine (CATAPRES) tablet 0.1 mg    cloNIDine (CATAPRES) tablet 0.1 mg   OLANZapine zydis (ZYPREXA) disintegrating tablet 5 mg   OLANZapine (ZYPREXA) injection 5 mg   OLANZapine (ZYPREXA) injection 10 mg   traZODone (DESYREL) tablet 50 mg     Recommendations  Based on my evaluation the patient does not appear to have an emergency medical condition.  Sindy Guadeloupe, NP 03/30/23  5:15 AM    Isa Rankin, MD 04/17/23 [phone]
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
## 2023-03-30 DIAGNOSIS — R42 Dizziness and giddiness: Secondary | ICD-10-CM | POA: Diagnosis not present

## 2023-03-30 DIAGNOSIS — F1910 Other psychoactive substance abuse, uncomplicated: Secondary | ICD-10-CM | POA: Diagnosis not present

## 2023-03-30 DIAGNOSIS — F339 Major depressive disorder, recurrent, unspecified: Secondary | ICD-10-CM | POA: Diagnosis not present

## 2023-03-30 DIAGNOSIS — Z5900 Homelessness unspecified: Secondary | ICD-10-CM | POA: Diagnosis not present

## 2023-03-30 LAB — URINE CULTURE: Culture: >=100000 — AB

## 2023-03-30 MED ORDER — OLANZAPINE 5 MG PO TABS
5.0000 mg | ORAL_TABLET | Freq: Once | ORAL | Status: AC
  Administered 2023-03-30: 5 mg via ORAL
  Filled 2023-03-30: qty 1

## 2023-03-30 MED ORDER — OLANZAPINE 10 MG PO TABS
10.0000 mg | ORAL_TABLET | Freq: Two times a day (BID) | ORAL | Status: DC
  Administered 2023-03-30 – 2023-04-02 (×6): 10 mg via ORAL
  Filled 2023-03-30 (×6): qty 1

## 2023-03-30 MED ORDER — OLANZAPINE 10 MG PO TABS: 10.0000 mg | ORAL_TABLET | Freq: Every day | ORAL | Status: DC

## 2023-03-30 MED ORDER — TRAZODONE HCL 50 MG PO TABS
50.0000 mg | ORAL_TABLET | Freq: Every evening | ORAL | Status: DC | PRN
  Administered 2023-03-30: 50 mg via ORAL
  Filled 2023-03-30: qty 1

## 2023-03-30 NOTE — ED Notes (Signed)
Patient alert & oriented x4. Denies intent to harm self or others when asked. Denies A/VH. Patient reports stomach pains/cramping as well as sweats associated with detox. 1000 Clonidine held due to low BP. Scheduled and PRN medications all administered with no complications. No acute distress noted. Support and encouragement provided. Routine safety checks conducted per facility protocol. Encouraged patient to notify staff if any thoughts of harm towards self or others arise. Patient verbalizes understanding and agreement.

## 2023-03-30 NOTE — ED Notes (Signed)
Patient is sleeping. Respirations equal and unlabored, skin warm and dry. No change in assessment or acuity. Routine safety checks conducted according to facility protocol. Will continue to monitor for safety.

## 2023-03-30 NOTE — ED Notes (Signed)
Patient sitting in dayroom interacting with peers. No acute distress noted. No concerns voiced. Informed patient to notify staff with any needs or assistance. Patient verbalized understanding or agreement. Safety checks in place per facility policy.
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
## 2023-03-30 NOTE — Discharge Instructions (Signed)
[facility] [address], [place], [address] [phone] phone  New Patient Assessment/Therapy Walk-Ins:  Monday and Wednesday: 8 am until slots are full. Every 1st and 2nd Fridays of the month: 1 pm - 5 pm.  NO ASSESSMENT/THERAPY WALK-INS ON TUESDAYS OR THURSDAYS  New Patient Assessment/Medication Management Walk-Ins:  Monday - Friday:  8 am - 11 am.  For all walk-ins, we ask that you arrive by 7:30 am because patients will be seen in the order of arrival.  Availability is limited; therefore, you may not be seen on the same day that you walk-in.  Our goal is to serve and meet the needs of our community to the best of our [place] ability.  SUBSTANCE USE TREATMENT for Medicaid and State Funded/IPRS  [facility] [address], [place], [address] [phone] phone NOTE: [facility] is no longer offering IOP services.  Serves those who are low-income or have no insurance.  [facility] [address] [place], [place], [address] [phone] phone [phone] fax NOTE: Does have Substance Abuse-Intensive Outpatient Program [facility]) as well as transitional housing if eligible.  [facility] [address] [place], [place], [address] [phone] phone [phone] fax  [facility] [phone] [place], [place], [address] [phone] phone [phone] fax  HALFWAY HOUSES:  [facility] [phone]  [employer].oxfordvacancies.com  12 STEP PROGRAMS:  Alcoholics Anonymous of [place] SoftwareChalet.be  Narcotics Anonymous of [place] [employer]ct.dk  Al-Anon of [place], [place] www.[place]alanon.org/find-meetings.html  Nar-Anon https://nar-anon.org/find-a-meetin  List of Residential placements:   [facility] in [place]: [phone]  [facility]: [phone]  [place], [place]  [phone]: Female and female facility; 30-day program: (uninsured and Medicaid such as Laurena Bering, [place], [place], partners)  [facility]: [phone]; men and women's facility; 28 days; Can have Medicaid tailored plan Tour manager or Partners)  [facility]: [phone] Karoline Caldwell or Larita Fife; 28 day program; must be fully detox; tailored Medicaid or no insurance  [address] in [place], [place]; [phone]; 28 day all males program; no insurance accepted  [facility] Referral in [place]: Gabriel Rung [phone] (no insurance or Medicaid only); 90 days; outpatient services but provide housing in apartments [place]  [facility] Admission: [phone]: Patient must complete phone screening for placement: [place], [place]; 6 month program; uninsured, Medicaid, and [employer].   [facility]: no insurance required; [phone]  [facility]: [phone]; Intake: Molly Maduro; Must fill out application online; Alecia Lemming Delay [phone] x [address] [facility] in [place], [place]: [phone]; Admissions Coordinators Mr. Maurine Minister or Barron Alvine; 90 day program.  [facility]: [place], [place] [phone]; Co-Ed 9 month to a year program; Online application; Men entry fee is $500 (6-53months);  [employer]: [address] [place], [place] [address]; no fee or insurance required; minimum of 2 years; Highly structured; work based; Intake Coordinator is Thayer Ohm [phone]  [facility] in [place], [place]: [phone]; Fax number is [phone]; website: www.[facility].org; Requires 3-6 page autobiography; 2 year program (18 months and then 25month transitional housing); Admission fee is $300; no insurance needed; work Automotive engineer in [place], [place]: [employer] Desk Staff: Danise Edge [phone]: They have a Men's Regenerations Program 6-39months. Free program; There is an initial $300 fee however, they are willing to work  with patients regarding that. Application is online.  [facility]: Admissions [phone] Doran Heater ext [phone]; Any 7-90 day program is out of pocket; 12  month program is free of charge; there is a $275 entry fee; Patient is responsible for own transportation

## 2023-03-30 NOTE — Group Note (Signed)
Group Topic: Recovery Basics  Group Date: 03/30/2023 Start Time: 1000 End Time: 1040 Facilitators: Londell Moh, NT  Department: [facility]  Number of Participants: 10  Group Focus: acceptance, check in, and clarity of thought Treatment Modality:  Psychoeducation Interventions utilized were exploration and patient education Purpose: express feelings, increase insight, regain self-worth, and reinforce self-care  Name: Cassidy Underwood [facility] Date of Birth: October 11, 1995  MR: [id number]    Level of Participation: minimal Quality of Participation: withdrawn Interactions with others: gave feedback Mood/Affect: closed / guarded Triggers (if applicable): n/a Cognition: coherent/clear Progress: Moderate Response: Pt did not speak during group but appeared to be listening and taking in the information. Plan: patient will be encouraged to attend future groups.  Patients Problems:  Patient Active Problem List   Diagnosis Date Noted   Rhabdomyolysis 06/25/2021   Drug overdose, accidental or unintentional, initial encounter 06/25/2021   AKI (acute kidney injury) (HCC) 06/25/2021   Leukocytosis 06/25/2021   Elevated LFTs 06/25/2021   Hyperglycemia 06/25/2021   Hearing loss 06/25/2021   [redacted] weeks gestation of pregnancy 12/17/2018   Normal labor 12/16/2018   Substance induced mood disorder (HCC)    Cocaine use disorder, severe, dependence (HCC) 02/22/2018   Major depressive disorder, single episode, severe without psychotic features (HCC) 02/22/2018   Major depressive disorder, single episode, severe without psychosis (HCC) 02/22/2018   Indication for care in labor or delivery 11/13/2017   Pregnancy with adoption planned 10/05/2017   IUGR (intrauterine growth restriction) affecting care of mother 10/05/2017   Supervision of high risk pregnancy, antepartum 08/02/2017   Polysubstance abuse (HCC) 07/29/2017   Homeless 11/02/2016

## 2023-03-30 NOTE — ED Notes (Signed)
Pt was provided lunch

## 2023-03-30 NOTE — Tx Team (Signed)
LCSW met with patient to assess current mood, affect, physical state, and inquire about needs/goals while here in [facility] and after discharge. Patient reports she presented due to "being scared of her boyfriend and feeling like she needed to get away because he has not been the same". When asked to elaborate further, patient stated "I have been seeing these big tall things, like human things standing by a tree". Patient placed emphasis on the fact that she was not under the influence when she saw these human like figures. Patient then proceeded to say "Its a world of them". Patient reports she believes these things are "following her boyfriend around, and she also believes he has sold his soul to the devil and sacrificed her in order to keep getting drugs". Patient reports her boyfriend would constantly say "I've sacrificed for you" "I'm always sacrificing for you". Patient reports boyfriend has had really bizarre and strange behavior that she has been afraid to be around. Patient reports she has been homeless for the past 10 years and has been living in a tent since then. Patient reports she has no contact with family as she has been estranged since the age of 79. Patient reports current substance use to include heroin, cocaine, and ice. Patient reports she has been using cocaine for the last 8 years. Patient reports ice is typically used when her and her boyfriend are not able to find heroin. Patient denies any alcohol use and denies ever receiving inpatient or outpatient treatment for her substance use. Patient reports she grew up in the mental health system and reports her last admission into a behavioral health hospital was about 4 years ago. Patient reports her current goal is to seek residential placement and reports an interest in going to [facility] in [place]. Patient aware that LCSW will send referrals out for review and will follow up to provide updates as received. Patient expressed understanding and appreciation  of LCSW assistance. No other needs were reported at this time by patient.   Referral has been sent to [facility] and [facility] for review. LCSW will continue to follow and provide support to patient while on [facility] unit.   Fernande Boyden, LCSW Clinical Social Worker [place] [facility] Ph: [phone]

## 2023-03-30 NOTE — ED Notes (Signed)
Patient resting with eyes closed in no apparent acute distress. Respirations even and unlabored. Environment secured. Safety checks in place according to facility policy.

## 2023-03-31 ENCOUNTER — Telehealth (HOSPITAL_BASED_OUTPATIENT_CLINIC_OR_DEPARTMENT_OTHER): Payer: Self-pay | Admitting: *Deleted

## 2023-03-31 ENCOUNTER — Other Ambulatory Visit (HOSPITAL_COMMUNITY): Payer: Self-pay

## 2023-03-31 DIAGNOSIS — R42 Dizziness and giddiness: Secondary | ICD-10-CM | POA: Diagnosis not present

## 2023-03-31 DIAGNOSIS — Z5900 Homelessness unspecified: Secondary | ICD-10-CM | POA: Diagnosis not present

## 2023-03-31 DIAGNOSIS — F339 Major depressive disorder, recurrent, unspecified: Secondary | ICD-10-CM | POA: Diagnosis not present

## 2023-03-31 DIAGNOSIS — F1910 Other psychoactive substance abuse, uncomplicated: Secondary | ICD-10-CM | POA: Diagnosis not present

## 2023-03-31 LAB — SARS CORONAVIRUS 2 BY RT PCR: SARS Coronavirus 2 by RT PCR: NEGATIVE

## 2023-03-31 MED ORDER — HYDROXYZINE HCL 25 MG PO TABS
25.0000 mg | ORAL_TABLET | Freq: Four times a day (QID) | ORAL | Status: DC | PRN
  Administered 2023-04-01 – 2023-04-02 (×2): 25 mg via ORAL
  Filled 2023-03-31 (×4): qty 1

## 2023-03-31 MED ORDER — NAPROXEN 500 MG PO TABS
500.0000 mg | ORAL_TABLET | Freq: Two times a day (BID) | ORAL | Status: DC
  Administered 2023-04-01 – 2023-04-02 (×3): 500 mg via ORAL
  Filled 2023-03-31 (×4): qty 1

## 2023-03-31 MED ORDER — LOPERAMIDE HCL 2 MG PO CAPS
2.0000 mg | ORAL_CAPSULE | ORAL | Status: DC | PRN
Start: 2023-03-31 — End: 2023-04-02

## 2023-03-31 MED ORDER — DICYCLOMINE HCL 20 MG PO TABS
20.0000 mg | ORAL_TABLET | Freq: Four times a day (QID) | ORAL | Status: DC | PRN
  Administered 2023-04-02: 20 mg via ORAL
  Filled 2023-03-31 (×2): qty 1

## 2023-03-31 MED ORDER — BIOTENE DRY MOUTH MT LIQD
15.0000 mL | OROMUCOSAL | Status: DC | PRN
  Administered 2023-03-31 – 2023-04-01 (×3): 15 mL via OROMUCOSAL
  Filled 2023-03-31 (×4): qty 15

## 2023-03-31 MED ORDER — CHEWING GUM (ORBIT) SUGAR FREE: 1.0000 | CHEWING_GUM | Freq: Four times a day (QID) | ORAL | Status: DC | PRN

## 2023-03-31 MED ORDER — NAPROXEN 125 MG/5ML PO SUSP: 500.0000 mg | Freq: Two times a day (BID) | ORAL | Status: DC | PRN

## 2023-03-31 NOTE — Group Note (Signed)
 Group Topic: Change and Accountability  Group Date: 03/31/2023 Start Time: 1030 End Time: 1115 Facilitators: Ninfa Linden, NT+3 MHT 2  Department: [facility]  Number of Participants: 3  Group Focus: personal responsibility Treatment Modality:  Behavior Modification Therapy Interventions utilized were exploration Purpose: explore maladaptive thinking  Name: Cassidy Underwood [facility] Date of Birth: 1995/06/12  MR: [id number]    Level of Participation: Patient did not attend Group Quality of Participation: N/A Interactions with others: N/A Mood/Affect: N/A Triggers (if applicable): N/A Cognition: N/A Progress: N/A Response: N/A Plan: N/A  Patients Problems:  Patient Active Problem List   Diagnosis Date Noted   Rhabdomyolysis 06/25/2021   Drug overdose, accidental or unintentional, initial encounter 06/25/2021   AKI (acute kidney injury) (HCC) 06/25/2021   Leukocytosis 06/25/2021   Elevated LFTs 06/25/2021   Hyperglycemia 06/25/2021   Hearing loss 06/25/2021   [redacted] weeks gestation of pregnancy 12/17/2018   Normal labor 12/16/2018   Substance induced mood disorder (HCC)    Cocaine use disorder, severe, dependence (HCC) 02/22/2018   Major depressive disorder, single episode, severe without psychotic features (HCC) 02/22/2018   Major depressive disorder, single episode, severe without psychosis (HCC) 02/22/2018   Indication for care in labor or delivery 11/13/2017   Pregnancy with adoption planned 10/05/2017   IUGR (intrauterine growth restriction) affecting care of mother 10/05/2017   Supervision of high risk pregnancy, antepartum 08/02/2017   Polysubstance abuse (HCC) 07/29/2017   Homeless 11/02/2016

## 2023-03-31 NOTE — ED Notes (Signed)
 Patient is calm and cooperative and has been visible on the unit without any distress noted. Patient was compliant with HS medications. Staff will continue to monitor safety and changes in condition.

## 2023-03-31 NOTE — ED Notes (Signed)
 Patient resting with eyes closed in no apparent acute distress. Respirations even and unlabored. Environment secured. Safety checks in place according to facility policy.

## 2023-03-31 NOTE — ED Notes (Signed)
 PRN Biotene given for dry mouth and PRN Bentyl given due to patient reported stomach pains rating a 8 "Hurts a lot" on the FACES scale. Biotene administered with no complications. Patient struggled to get Bentyl down smoothly, appeared ready to throw up. Per patient, patient has regular difficulty getting down pills. Provider Alfonse Flavors, MD made aware. Patient able to get medication down successfully, patient in no acute distress. Environment secured, safety checks in place per facility policy.

## 2023-03-31 NOTE — ED Provider Notes (Signed)
 Behavioral Health Progress Note  Date and Time: 03/31/2023 7:20 AM Name: Cassidy [facility] MRN:  [id number]  Subjective:  "I feel horrible"  Diagnosis:  Final diagnoses:  Polysubstance abuse (HCC)  Recurrent major depressive disorder, remission status unspecified (HCC)  Homelessness    Total Time spent with patient: 1.5 hours  HPI:  Cassidy [facility] is a 28 y.o. female with a history of substance abuse (cocaine, heroin, opioid) and homeless for the last 10 years. Patient reports that she has been using crack cocaine for 8-10 years, heroin and fentanyl for the past 1-2 years and trank which is a form of horse tranquilizer off and on in the last month.  Her last use of trank was 03/28/2023. Her last use of heroin was 03/29/2023 at 8 AM prior to arrival to the [facility] facility based [facility]. On assessment today, patient continues to report feeling physically unwell.  She endorses abdominal cramping and feeling tired.  As well, she complains of dry mouth.  Her sleep has been "up and down" and she has been trying to advance her appetite.  She denies SI, HI, AVH, and paranoia, particularly since starting the Zyprexa.  She reports feeling safe on the unit.  She denies adverse effects of medications.  Patient remains agreeable to residential substance use treatment at this time.    Past Psychiatric History: Inpatient psychiatric hospitalization at 23 years Past Medical History:  Past Medical History:  Diagnosis Date   Abscess    Asthma    Cocaine abuse (HCC)    Medical history non-contributory    Substance abuse (HCC)     Family History: Unknown Family Psychiatric  History: unknown- adopted. Social History: Polysubstance abuse  Additional Social History:                         Sleep: Poor  Appetite:  Poor  Current Medications:  Current Facility-Administered Medications  Medication Dose Route Frequency Provider Last Rate Last Admin   acetaminophen  (TYLENOL) tablet 650 mg  650 mg Oral Q6H PRN Sindy Guadeloupe, NP       alum & mag hydroxide-simeth (MAALOX/MYLANTA) 200-200-20 MG/5ML suspension 30 mL  30 mL Oral Q4H PRN Sindy Guadeloupe, NP       cloNIDine (CATAPRES) tablet 0.1 mg  0.1 mg Oral QID Sindy Guadeloupe, NP   0.1 mg at 03/30/23 2256   Followed by   cloNIDine (CATAPRES) tablet 0.1 mg  0.1 mg Oral Elgie Collard, NP       Followed by   Melene Muller ON 04/03/2023] cloNIDine (CATAPRES) tablet 0.1 mg  0.1 mg Oral QAC breakfast Sindy Guadeloupe, NP       dicyclomine (BENTYL) tablet 20 mg  20 mg Oral Q6H PRN Sindy Guadeloupe, NP   20 mg at 03/30/23 1332   hydrOXYzine (ATARAX) tablet 25 mg  25 mg Oral Q6H PRN Sindy Guadeloupe, NP   25 mg at 03/30/23 1526   loperamide (IMODIUM) capsule 2-4 mg  2-4 mg Oral PRN Sindy Guadeloupe, NP       magnesium hydroxide (MILK OF MAGNESIA) suspension 30 mL  30 mL Oral Daily PRN Sindy Guadeloupe, NP       methocarbamol (ROBAXIN) tablet 500 mg  500 mg Oral Q8H PRN Sindy Guadeloupe, NP       naproxen (NAPROSYN) tablet 500 mg  500 mg Oral BID PRN Sindy Guadeloupe, NP   500 mg at 03/30/23 1004   OLANZapine (ZYPREXA) injection 10 mg  10 mg Intramuscular TID PRN Sindy Guadeloupe, NP       OLANZapine [facility]) injection 5 mg  5 mg Intramuscular TID PRN Sindy Guadeloupe, NP       OLANZapine (ZYPREXA) tablet 10 mg  10 mg Oral BID Mariel Craft, MD   10 mg at 03/30/23 2255   OLANZapine zydis (ZYPREXA) disintegrating tablet 5 mg  5 mg Oral TID PRN Sindy Guadeloupe, NP       ondansetron (ZOFRAN-ODT) disintegrating tablet 4 mg  4 mg Oral Q6H PRN Sindy Guadeloupe, NP       Current Outpatient Medications  Medication Sig Dispense Refill   Buprenorphine HCl-Naloxone HCl (SUBOXONE) 8-2 MG FILM Place 1 Film under the tongue daily. (Patient not taking: Reported on 03/30/2023)     cephALEXin (KEFLEX) 500 MG capsule Take 1 capsule (500 mg total) by mouth 4 (four) times daily. (Patient not taking: Reported on 03/30/2023) 20 capsule 0   chlordiazePOXIDE (LIBRIUM) 25  MG capsule Take two capsules by mouth three times daily for one day, then one to two capsules twice daily for one day, then one to two capsules once daily for one day. (Patient not taking: Reported on 03/30/2023) 12 capsule 0    Labs  Lab Results:  Admission on 03/29/2023  Component Date Value Ref Range Status   WBC 03/29/2023 10.1  4.0 - 10.5 K/uL Final   RBC 03/29/2023 4.62  3.87 - 5.11 MIL/uL Final   Hemoglobin 03/29/2023 13.2  12.0 - 15.0 g/dL Final   HCT 16/11/9602 39.5  36.0 - 46.0 % Final   MCV 03/29/2023 85.5  80.0 - 100.0 fL Final   MCH 03/29/2023 28.6  26.0 - 34.0 pg Final   MCHC 03/29/2023 33.4  30.0 - 36.0 g/dL Final   RDW 54/10/8117 11.7  11.5 - 15.5 % Final   Platelets 03/29/2023 407 (H)  150 - 400 K/uL Final   nRBC 03/29/2023 0.0  0.0 - 0.2 % Final   Neutrophils Relative % 03/29/2023 53  % Final   Neutro Abs 03/29/2023 5.2  1.7 - 7.7 K/uL Final   Lymphocytes Relative 03/29/2023 41  % Final   Lymphs Abs 03/29/2023 4.1 (H)  0.7 - 4.0 K/uL Final   Monocytes Relative 03/29/2023 5  % Final   Monocytes Absolute 03/29/2023 0.5  0.1 - 1.0 K/uL Final   Eosinophils Relative 03/29/2023 1  % Final   Eosinophils Absolute 03/29/2023 0.1  0.0 - 0.5 K/uL Final   Basophils Relative 03/29/2023 0  % Final   Basophils Absolute 03/29/2023 0.0  0.0 - 0.1 K/uL Final   Immature Granulocytes 03/29/2023 0  % Final   Abs Immature Granulocytes 03/29/2023 0.04  0.00 - 0.07 K/uL Final   Performed at [facility], [address], [place], [place] [address]   Sodium 03/29/2023 140  135 - 145 mmol/L Final   Potassium 03/29/2023 3.7  3.5 - 5.1 mmol/L Final   Chloride 03/29/2023 105  98 - 111 mmol/L Final   CO2 03/29/2023 25  22 - 32 mmol/L Final   Glucose, Bld 03/29/2023 86  70 - 99 mg/dL Final   Glucose reference range applies only to samples taken after fasting for at least 8 hours.   BUN 03/29/2023 13  6 - 20 mg/dL Final   Creatinine, Ser 03/29/2023 0.75  0.44 - 1.00 mg/dL Final   Calcium  95/62/1308 9.0  8.9 - 10.3 mg/dL Final   Total Protein 65/78/4696 6.7  6.5 - 8.1 g/dL  Final   Albumin 03/29/2023 3.8  3.5 - 5.0 g/dL Final   AST 16/11/9602 31  15 - 41 U/L Final   ALT 03/29/2023 28  0 - 44 U/L Final   Alkaline Phosphatase 03/29/2023 61  38 - 126 U/L Final   Total Bilirubin 03/29/2023 0.6  0.0 - 1.2 mg/dL Final   GFR, Estimated 03/29/2023 >60  >60 mL/min Final   Comment: (NOTE) Calculated using the CKD-EPI Creatinine Equation (2021)    Anion gap 03/29/2023 10  5 - 15 Final   Performed at [facility], [address] [place], [place] [address]   Alcohol, Ethyl (B) 03/29/2023 <10  <10 mg/dL Final   Comment: (NOTE) Lowest detectable limit for serum alcohol is 10 mg/dL.  For medical purposes only. Performed at [facility], [address], [place], [place] [address]    TSH 03/29/2023 0.378  0.350 - 4.500 uIU/mL Final   Comment: Performed by a 3rd Generation assay with a functional sensitivity of <=0.01 uIU/mL. Performed at [facility], [address] [place], [place] [address]    Preg Test, Ur 03/29/2023 Negative  Negative Final   POC Amphetamine UR 03/29/2023 None Detected  NONE DETECTED (Cut Off Level 1000 ng/mL) Final   POC Secobarbital (BAR) 03/29/2023 None Detected  NONE DETECTED (Cut Off Level 300 ng/mL) Final   POC Buprenorphine (BUP) 03/29/2023 Positive (A)  NONE DETECTED (Cut Off Level 10 ng/mL) Final   POC Oxazepam (BZO) 03/29/2023 Positive (A)  NONE DETECTED (Cut Off Level 300 ng/mL) Final   POC Cocaine UR 03/29/2023 Positive (A)  NONE DETECTED (Cut Off Level 300 ng/mL) Final   POC Methamphetamine UR 03/29/2023 None Detected  NONE DETECTED (Cut Off Level 1000 ng/mL) Final   POC Morphine 03/29/2023 None Detected  NONE DETECTED (Cut Off Level 300 ng/mL) Final   POC Methadone UR 03/29/2023 None Detected  NONE DETECTED (Cut Off Level 300 ng/mL) Final   POC Oxycodone UR 03/29/2023 None Detected  NONE DETECTED (Cut Off Level 100 ng/mL) Final    POC Marijuana UR 03/29/2023 None Detected  NONE DETECTED (Cut Off Level 50 ng/mL) Final  Admission on 03/28/2023, Discharged on 03/28/2023  Component Date Value Ref Range Status   Total CK 03/28/2023 361 (H)  38 - 234 U/L Final   Performed at [facility], [address] [place], [place] [address]   WBC 03/28/2023 11.2 (H)  4.0 - 10.5 K/uL Final   RBC 03/28/2023 4.49  3.87 - 5.11 MIL/uL Final   Hemoglobin 03/28/2023 13.2  12.0 - 15.0 g/dL Final   HCT 86/57/8469 38.2  36.0 - 46.0 % Final   MCV 03/28/2023 85.1  80.0 - 100.0 fL Final   MCH 03/28/2023 29.4  26.0 - 34.0 pg Final   MCHC 03/28/2023 34.6  30.0 - 36.0 g/dL Final   RDW 62/95/2841 11.6  11.5 - 15.5 % Final   Platelets 03/28/2023 448 (H)  150 - 400 K/uL Final   nRBC 03/28/2023 0.0  0.0 - 0.2 % Final   Neutrophils Relative % 03/28/2023 37  % Final   Neutro Abs 03/28/2023 4.2  1.7 - 7.7 K/uL Final   Lymphocytes Relative 03/28/2023 54  % Final   Lymphs Abs 03/28/2023 6.0 (H)  0.7 - 4.0 K/uL Final   Monocytes Relative 03/28/2023 6  % Final   Monocytes Absolute 03/28/2023 0.7  0.1 - 1.0 K/uL Final   Eosinophils Relative 03/28/2023 3  % Final  Eosinophils Absolute 03/28/2023 0.4  0.0 - 0.5 K/uL Final   Basophils Relative 03/28/2023 0  % Final   Basophils Absolute 03/28/2023 0.1  0.0 - 0.1 K/uL Final   Immature Granulocytes 03/28/2023 0  % Final   Abs Immature Granulocytes 03/28/2023 0.05  0.00 - 0.07 K/uL Final   Performed at [facility], [address] [place], [place] [address]   Sodium 03/28/2023 134 (L)  135 - 145 mmol/L Final   Potassium 03/28/2023 3.8  3.5 - 5.1 mmol/L Final   Chloride 03/28/2023 98  98 - 111 mmol/L Final   CO2 03/28/2023 26  22 - 32 mmol/L Final   Glucose, Bld 03/28/2023 111 (H)  70 - 99 mg/dL Final   Glucose reference range applies only to samples taken after fasting for at least 8 hours.   BUN 03/28/2023 22 (H)  6 - 20 mg/dL Final   Creatinine, Ser 03/28/2023 0.89   0.44 - 1.00 mg/dL Final   Calcium 09/81/1914 8.7 (L)  8.9 - 10.3 mg/dL Final   GFR, Estimated 03/28/2023 >60  >60 mL/min Final   Comment: (NOTE) Calculated using the CKD-EPI Creatinine Equation (2021)    Anion gap 03/28/2023 10  5 - 15 Final   Performed at [facility], [address] [place], [place] [address]   SARS Coronavirus 2 by RT PCR 03/28/2023 NEGATIVE  NEGATIVE Final   Comment: (NOTE) SARS-CoV-2 target nucleic acids are NOT DETECTED.  The SARS-CoV-2 RNA is generally detectable in upper respiratory specimens during the acute phase of infection. The lowest concentration of SARS-CoV-2 viral copies this assay can detect is 138 copies/mL. A negative result does not preclude SARS-Cov-2 infection and should not be used as the sole basis for treatment or other patient management decisions. A negative result may occur with  improper specimen collection/handling, submission of specimen other than nasopharyngeal swab, presence of viral mutation(s) within the areas targeted by this assay, and inadequate number of viral copies(<138 copies/mL). A negative result must be combined with clinical observations, patient history, and epidemiological information. The expected result is Negative.  Fact Sheet for Patients:  BloggerCourse.com  Fact Sheet for Healthcare Providers:  SeriousBroker.it  This test is no                          t yet approved or cleared by the [place] FDA and  has been authorized for detection and/or diagnosis of SARS-CoV-2 by FDA under an Emergency Use Authorization (EUA). This EUA will remain  in effect (meaning this test can be used) for the duration of the COVID-19 declaration under Section 564(b)(1) of the Act, 21 U.S.C.section 360bbb-3(b)(1), unless the authorization is terminated  or revoked sooner.       Influenza A by PCR 03/28/2023 NEGATIVE  NEGATIVE Final   Influenza B by PCR  03/28/2023 NEGATIVE  NEGATIVE Final   Comment: (NOTE) The Xpert Xpress SARS-CoV-2/FLU/RSV plus assay is intended as an aid in the diagnosis of influenza from Nasopharyngeal swab specimens and should not be used as a sole basis for treatment. Nasal washings and aspirates are unacceptable for Xpert Xpress SARS-CoV-2/FLU/RSV testing.  Fact Sheet for Patients: BloggerCourse.com  Fact Sheet for Healthcare Providers: SeriousBroker.it  This test is not yet approved or cleared by the [place] FDA and has been authorized for detection and/or diagnosis of SARS-CoV-2 by FDA under an Emergency Use Authorization (EUA). This EUA will remain in effect (meaning this test  can be used) for the duration of the COVID-19 declaration under Section 564(b)(1) of the Act, 21 U.S.C. section 360bbb-3(b)(1), unless the authorization is terminated or revoked.     Resp Syncytial Virus by PCR 03/28/2023 NEGATIVE  NEGATIVE Final   Comment: (NOTE) Fact Sheet for Patients: BloggerCourse.com  Fact Sheet for Healthcare Providers: SeriousBroker.it  This test is not yet approved or cleared by the [place] FDA and has been authorized for detection and/or diagnosis of SARS-CoV-2 by FDA under an Emergency Use Authorization (EUA). This EUA will remain in effect (meaning this test can be used) for the duration of the COVID-19 declaration under Section 564(b)(1) of the Act, 21 U.S.C. section 360bbb-3(b)(1), unless the authorization is terminated or revoked.  Performed at [facility], [address] [place], [place] [address]    Specimen Source 03/28/2023 URINE, CLEAN CATCH   Final   Color, Urine 03/28/2023 YELLOW  YELLOW Final   APPearance 03/28/2023 CLOUDY (A)  CLEAR Final   Specific Gravity, Urine 03/28/2023 1.024  1.005 - 1.030 Final   pH 03/28/2023 8.0  5.0 - 8.0 Final   Glucose, UA  03/28/2023 NEGATIVE  NEGATIVE mg/dL Final   Hgb urine dipstick 03/28/2023 NEGATIVE  NEGATIVE Final   Bilirubin Urine 03/28/2023 NEGATIVE  NEGATIVE Final   Ketones, ur 03/28/2023 NEGATIVE  NEGATIVE mg/dL Final   Protein, ur 56/21/3086 100 (A)  NEGATIVE mg/dL Final   Nitrite 57/84/6962 POSITIVE (A)  NEGATIVE Final   Leukocytes,Ua 03/28/2023 MODERATE (A)  NEGATIVE Final   RBC / HPF 03/28/2023 0-5  0 - 5 RBC/hpf Final   WBC, UA 03/28/2023 >50  0 - 5 WBC/hpf Final   Comment:        Reflex urine culture not performed if WBC <=10, OR if Squamous epithelial cells >5. If Squamous epithelial cells >5 suggest recollection.    Bacteria, UA 03/28/2023 MANY (A)  NONE SEEN Final   Squamous Epithelial / HPF 03/28/2023 0-5  0 - 5 /HPF Final   Mucus 03/28/2023 PRESENT   Final   Hyaline Casts, UA 03/28/2023 PRESENT   Final   Triple Phosphate Crystal 03/28/2023 PRESENT   Final   Performed at [facility], [address], [place], [place] [address]   Opiates 03/28/2023 NONE DETECTED  NONE DETECTED Final   Cocaine 03/28/2023 POSITIVE (A)  NONE DETECTED Final   Benzodiazepines 03/28/2023 POSITIVE (A)  NONE DETECTED Final   Amphetamines 03/28/2023 POSITIVE (A)  NONE DETECTED Final   Tetrahydrocannabinol 03/28/2023 NONE DETECTED  NONE DETECTED Final   Barbiturates 03/28/2023 NONE DETECTED  NONE DETECTED Final   Comment: (NOTE) DRUG SCREEN FOR MEDICAL PURPOSES ONLY.  IF CONFIRMATION IS NEEDED FOR ANY PURPOSE, NOTIFY LAB WITHIN 5 DAYS.  LOWEST DETECTABLE LIMITS FOR URINE DRUG SCREEN Drug Class                     Cutoff (ng/mL) Amphetamine and metabolites    1000 Barbiturate and metabolites    200 Benzodiazepine                 200 Opiates and metabolites        300 Cocaine and metabolites        300 THC                            50 Performed at [facility], [address] [place], [place] [address]    Preg  Test, Ur 03/28/2023 NEGATIVE  NEGATIVE Final    Comment:        THE SENSITIVITY OF THIS METHODOLOGY IS >25 mIU/mL. Performed at [facility], [address] [place], [place] [address]    Specimen Description 03/28/2023    Final                   Value:URINE, RANDOM Performed at [facility], [address] [place], [place] [address]    Special Requests 03/28/2023    Final                   Value:NONE Reflexed from [id number] Performed at [facility], [address], [place], [place] [address]    Culture 03/28/2023 >=100,000 COLONIES/mL PROTEUS MIRABILIS (A)   Final   Report Status 03/28/2023 03/30/2023 FINAL   Final   Organism ID, Bacteria 03/28/2023 PROTEUS MIRABILIS (A)   Final   Specimen Description 03/28/2023    Final                   Value:BLOOD RIGHT ANTECUBITAL Performed at [facility], [address] [place], [place] [address]    Special Requests 03/28/2023    Final                   Value:BOTTLES DRAWN AEROBIC AND ANAEROBIC Blood Culture adequate volume Performed at [facility], [address], [place], [place] [address]    Culture 03/28/2023    Final                   Value:NO GROWTH 2 DAYS Performed at [facility], [address], [place], [place] [address]    Report Status 03/28/2023 PENDING   Incomplete   Specimen Description 03/28/2023    Final                   Value:BLOOD LEFT ARM Performed at [facility], [address] [place], [place] [address]    Special Requests 03/28/2023    Final                   Value:BOTTLES DRAWN AEROBIC AND ANAEROBIC Blood Culture results may not be optimal due to an inadequate volume of blood received in culture bottles Performed at [facility], [address], [place], [place] [address]    Culture 03/28/2023    Final                   Value:NO GROWTH 2 DAYS Performed at [facility], [address], [place], [place] [address]    Report  Status 03/28/2023 PENDING   Incomplete   Lactic Acid, Venous 03/28/2023 2.8 (HH)  0.5 - 1.9 mmol/L Final   Comment 03/28/2023 NOTIFIED PHYSICIAN   Final   Troponin I (High Sensitivity) 03/28/2023 <2  <18 ng/L Final   Comment: (NOTE) Elevated high sensitivity troponin I (hsTnI) values and significant  changes across serial measurements may suggest ACS but many other  chronic and acute conditions are known to elevate hsTnI results.  Refer to the "Links" section for chest pain algorithms and additional  guidance. Performed at [facility], [address], [place], [place] [address]    Lactic Acid, Venous 03/28/2023 1.0  0.5 - 1.9 mmol/L Final    Blood Alcohol level:  Lab Results  Component Value Date   ETH <10 03/29/2023  ETH <10 08/13/2022    Metabolic Disorder Labs: Lab Results  Component Value Date   HGBA1C 5.4 06/25/2021   MPG 108.28 06/25/2021   No results found for: "PROLACTIN" No results found for: "CHOL", "TRIG", "HDL", "CHOLHDL", "VLDL", "LDLCALC"  Therapeutic Lab Levels: No results found for: "LITHIUM" No results found for: "VALPROATE" No results found for: "CBMZ"  Physical Findings   AIMS    Flowsheet Row Admission (Discharged) from 02/22/2018 in [facility]  AIMS Total Score 0      AUDIT    Flowsheet Row Admission (Discharged) from 02/22/2018 in [facility]PAT[facility]  Alcohol Use Disorder Identification Test Final Score (AUDIT) 8      PHQ2-9    Flowsheet Row ED from 03/29/2023 in [facility]  PHQ-2 Total Score 1  PHQ-9 Total Score 15      Flowsheet Row ED from 03/29/2023 in [facility] from 03/28/2023 in [facility] Department at [facility] ED from 08/13/2022 in [facility] Emergency Department at [facility]  C-SSRS RISK CATEGORY No Risk No Risk No Risk        Musculoskeletal  Strength & Muscle Tone:  within normal limits Gait & Station:  Patient lying in bed; unable to assess Patient leans: N/A  Psychiatric Specialty Exam  Presentation  General Appearance:  Casual  Eye Contact: Good  Speech: Clear and Coherent  Speech Volume: Normal  Handedness: Right   Mood and Affect  Mood: Anxious; Depressed  Affect: Congruent   Thought Process  Thought Processes: Coherent  Descriptions of Associations:Intact  Orientation:Full (Time, Place and Person)  Thought Content:Logical  Diagnosis of Schizophrenia or Schizoaffective disorder in past: No    Hallucinations:Hallucinations: Visual Description of Visual Hallucinations: shadows of people following her  Ideas of Reference:None  Suicidal Thoughts:No data recorded  Homicidal Thoughts:No data recorded   Sensorium  Memory: Immediate Fair  Judgment: Fair  Insight: Fair   Art therapist  Concentration: Fair  Attention Span: Good  Recall: Good  Fund of Knowledge: Good  Language: Good   Psychomotor Activity  Psychomotor Activity: No data recorded   Assets  Assets: Desire for Improvement; Housing; Resilience; Social Support   Sleep  Sleep: No data recorded   No data recorded   Physical Exam  Physical Exam Vitals and nursing note reviewed.  Constitutional:      Appearance: Normal appearance.  HENT:     Head: Normocephalic and atraumatic.     Nose: No congestion.  Eyes:     Conjunctiva/sclera: Conjunctivae normal.  Cardiovascular:     Rate and Rhythm: Normal rate.  Pulmonary:     Effort: Pulmonary effort is normal. No respiratory distress.  Musculoskeletal:        General: Normal range of motion.     Cervical back: Normal range of motion.  Neurological:     General: No focal deficit present.     Mental Status: She is alert and oriented to person, place, and time.    Review of Systems  Constitutional:  Positive for chills and diaphoresis. Negative for fever.  HENT:   Positive for sore throat.   Cardiovascular:  Positive for palpitations. Negative for chest pain.  Gastrointestinal:  Positive for abdominal pain.  Musculoskeletal:  Positive for myalgias.  Neurological:  Positive for dizziness and headaches. Negative for focal weakness.  Psychiatric/Behavioral:  Positive for substance abuse. Negative for depression, hallucinations and suicidal ideas. The patient has insomnia. The patient  is not nervous/anxious.    Blood pressure 114/80, pulse 61, temperature 98.1 F (36.7 C), temperature source Oral, resp. rate 16, SpO2 100%, unknown if currently breastfeeding. There is no height or weight on file to calculate BMI.  Treatment Plan Summary: Daily contact with patient to assess and evaluate symptoms and progress in treatment and Medication management  -Primary team previously offered Suboxone treatment, which patient declined -Continue COWS protocol, with Clonidine taper. Hold for SBP <90. 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  -Patient specifically denies any SI, HI, VH and continues to meet criteria for admission to [facility].  Patient is able to contact for safety while at the [facility].  -Appreciate social work assistance for discharge planning.  Patient is interested in a long-term residential, which I think would be very beneficial given her situation with no social support system in place.   Lamar Sprinkles, MD 03/31/2023 7:20 AM

## 2023-03-31 NOTE — ED Notes (Signed)
 Patient is resting with eyes closed with even unlabored breathing. No s/s of distress or withdrawals. Staff will continue to monitor safety and changes in condition.

## 2023-03-31 NOTE — Telephone Encounter (Signed)
 Post ED Visit - Positive Culture Follow-up  Culture report reviewed by antimicrobial stewardship pharmacist: [facility] []  Enzo Bi, Pharm.D. []  Celedonio Miyamoto, Pharm.D., BCPS AQ-ID []  Garvin Fila, Pharm.D., BCPS []  Georgina Pillion, Pharm.D., BCPS []  [place], [place].D., BCPS, AAHIVP []  Estella Husk, Pharm.D., BCPS, AAHIVP []  Lysle Pearl, PharmD, BCPS []  Phillips Climes, PharmD, BCPS []  Agapito Games, PharmD, BCPS []  Verlan Friends, PharmD []  Mervyn Gay, PharmD, BCPS []  Vinnie Level, PharmD  [facility] []  Len Childs, PharmD []  Greer Pickerel, PharmD []  Adalberto Cole, PharmD []  Perlie Gold, Rph []  Lonell Face) Jean Rosenthal, PharmD []  Earl Many, PharmD []  Junita Push, PharmD []  Dorna Leitz, PharmD []  Terrilee Files, PharmD []  Lynann Beaver, PharmD []  Keturah Barre, PharmD []  Loralee Pacas, PharmD [x]  Laureen Ochs, PharmD   Positive urine culture Treated with Cephalexin, organism sensitive to the same and no further patient follow-up is required at this time.  Cassidy Underwood 03/31/2023, 8:55 AM

## 2023-03-31 NOTE — ED Notes (Signed)
 Patient is resting in bed with eyes closed. She is easily aroused without any distress noted. Patient is wearing her personal clothing and she denies SIHI, AVH, and anxiety/depression. She reports withdrawal symptoms of shakes and reports sleeping off and on without having nightmares. Staff will continue to monitor safety and for changes in condition.

## 2023-03-31 NOTE — ED Notes (Signed)
 Specimen handed to [facility] for transport.

## 2023-03-31 NOTE — ED Notes (Signed)
 Abnormal BP of 82/60 obtained manually, scheduled clonidine held. Patient asymptomatic, in no acute distress. Provider Lamar Sprinkles, MD made aware.

## 2023-03-31 NOTE — Group Note (Signed)
 Group Topic: Change and Accountability  Group Date: 03/30/2023 Start Time: 1930 End Time: 2000 Facilitators: Debe Coder, NT  Department: [facility]  Number of Participants: 6  Group Focus: affirmation Treatment Modality:  Individual Therapy Interventions utilized were group exercise Purpose: explore maladaptive thinking and express feelings  Name: Cassidy Underwood [facility] Date of Birth: 1995/05/31  MR: [id number]    Level of Participation: pt did not attend group  Quality of Participation: pt did not attend group  Interactions with others:  pt did not attend group Mood/Affect:  pt did not attend group Triggers (if applicable):  pt did not attend group Cognition:  pt did not attend group Progress:  pt did not attend group Response:  pt did not attend group Plan:  pt did not attend group  Patients Problems:  Patient Active Problem List   Diagnosis Date Noted   Rhabdomyolysis 06/25/2021   Drug overdose, accidental or unintentional, initial encounter 06/25/2021   AKI (acute kidney injury) (HCC) 06/25/2021   Leukocytosis 06/25/2021   Elevated LFTs 06/25/2021   Hyperglycemia 06/25/2021   Hearing loss 06/25/2021   [redacted] weeks gestation of pregnancy 12/17/2018   Normal labor 12/16/2018   Substance induced mood disorder (HCC)    Cocaine use disorder, severe, dependence (HCC) 02/22/2018   Major depressive disorder, single episode, severe without psychotic features (HCC) 02/22/2018   Major depressive disorder, single episode, severe without psychosis (HCC) 02/22/2018   Indication for care in labor or delivery 11/13/2017   Pregnancy with adoption planned 10/05/2017   IUGR (intrauterine growth restriction) affecting care of mother 10/05/2017   Supervision of high risk pregnancy, antepartum 08/02/2017   Polysubstance abuse (HCC) 07/29/2017   Homeless 11/02/2016

## 2023-03-31 NOTE — ED Notes (Signed)
 Patient alert & oriented x4. Denies intent to harm self or others when asked. Denies A/VH. Patient reports shakiness and stomach pains related to detox, PRN medications provided. Scheduled medications given, patient struggles taking tablets and capsule medications. No acute distress noted. Support and encouragement provided. Routine safety checks conducted per facility protocol. Encouraged patient to notify staff if any thoughts of harm towards self or others arise. Patient verbalizes understanding and agreement.

## 2023-04-01 DIAGNOSIS — R42 Dizziness and giddiness: Secondary | ICD-10-CM | POA: Diagnosis not present

## 2023-04-01 DIAGNOSIS — F339 Major depressive disorder, recurrent, unspecified: Secondary | ICD-10-CM | POA: Diagnosis not present

## 2023-04-01 DIAGNOSIS — F1910 Other psychoactive substance abuse, uncomplicated: Secondary | ICD-10-CM | POA: Diagnosis not present

## 2023-04-01 DIAGNOSIS — Z5900 Homelessness unspecified: Secondary | ICD-10-CM | POA: Diagnosis not present

## 2023-04-01 MED ORDER — TRAZODONE HCL 50 MG PO TABS
50.0000 mg | ORAL_TABLET | Freq: Every evening | ORAL | Status: DC | PRN
  Administered 2023-04-01: 50 mg via ORAL
  Filled 2023-04-01: qty 1

## 2023-04-01 NOTE — ED Notes (Signed)
 Assumed care of patient this pm, she present A&O x 3, affect & mood pleasant and cooperative upon approach. Pt out in the milieu, @ intervals sociable, behavior appropriate to situation and she had c/o GI distress and "dry throat".  Pt denied thoughts, plan or intent to harm self or others,  she made a safety commitment and also denied current A/V/H.  Pt is compliant with taking medications and following her treatment plan. Pt educated on plan of care, medication regimen and she acknowledged an understanding and was without further complaints or concerns. Pt is managed on q 15 min rounds.

## 2023-04-01 NOTE — Group Note (Signed)
 Group Topic: Healthy Self Image and Positive Change  Group Date: 04/01/2023 Start Time: 1000 End Time: 1050 Facilitators: Ninfa Linden, NT +3 MHT 2 Department: [facility]  Number of Participants: 3  Group Focus: self-esteem Treatment Modality:  Patient-Centered Therapy Interventions utilized were clarification Purpose: enhance coping skills and regain self-worth  Name: Cassidy Underwood [facility] Date of Birth: 1995-09-01  MR: [id number]    Level of Participation: Patient did not attend Group Quality of Participation: N/A Interactions with others: N/A Mood/Affect: N/A Triggers (if applicable): N/A Cognition: N/A Progress: N/A Response: N/A Plan: N/A  Patients Problems:  Patient Active Problem List   Diagnosis Date Noted   Rhabdomyolysis 06/25/2021   Drug overdose, accidental or unintentional, initial encounter 06/25/2021   AKI (acute kidney injury) (HCC) 06/25/2021   Leukocytosis 06/25/2021   Elevated LFTs 06/25/2021   Hyperglycemia 06/25/2021   Hearing loss 06/25/2021   [redacted] weeks gestation of pregnancy 12/17/2018   Normal labor 12/16/2018   Substance induced mood disorder (HCC)    Cocaine use disorder, severe, dependence (HCC) 02/22/2018   Major depressive disorder, single episode, severe without psychotic features (HCC) 02/22/2018   Major depressive disorder, single episode, severe without psychosis (HCC) 02/22/2018   Indication for care in labor or delivery 11/13/2017   Pregnancy with adoption planned 10/05/2017   IUGR (intrauterine growth restriction) affecting care of mother 10/05/2017   Supervision of high risk pregnancy, antepartum 08/02/2017   Polysubstance abuse (HCC) 07/29/2017   Homeless 11/02/2016

## 2023-04-01 NOTE — ED Notes (Signed)
 Patient seen resting in bed with eyes closed and was easily aroused to her name being called. She currently denies SIHI, AVH, and depression and reports anxiety 4. She is calm and cooperative and wearing her personal clothing. She reports sleeping off and on during the night and denies having night mares. Staff will continue to monitor safety and changes in condition.

## 2023-04-01 NOTE — ED Notes (Signed)
 Patient is resting in bed with eyes closed without any distress noted. 2200 Clonidine 0.1mg  PO was held due to BP 102/53. During checking her BP, she c/o feeling light headed and nausea. After helping her to a chair to sit, she stated that she had to use the restroom urgently. The patient reported having a BM after not having one for some time. She received her other HS medication with issues swallowing them whole. Staff will continue to monitor for safety and changes in her condition.
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
## 2023-04-01 NOTE — ED Notes (Signed)
 Assumed care of pt. This am, A&O x3, pleasant and cooperative no complaints , no SI, HI AVH.

## 2023-04-01 NOTE — ED Notes (Signed)
 Patient c/o anixety. Hydroxyzine 25mg  Po given along with HS medication. Patient tolerated them okay and reports some difficulty swallowing them. Staff will continue to monitor safety and changes in condition.

## 2023-04-01 NOTE — ED Notes (Signed)
 Patient asked for something to help her sleep. Orders for Trazodone 50mg  was placed and given to patient. Staff will continue to monitor safety and changes in condition.

## 2023-04-01 NOTE — Group Note (Signed)
 Group Topic: Relapse and Recovery  Group Date: 03/31/2023 Start Time: 2000 End Time: 2100 Facilitators: Debe Coder, NT  Department: [facility]  Number of Participants: 8  Group Focus: goals/reality orientation Treatment Modality:  Individual Therapy Interventions utilized were patient education Purpose: reinforce self-care  Name: Cassidy Underwood [facility] Date of Birth: 08/07/1995  MR: [id number]    Level of Participation: pt did not attend group Quality of Participation: pt did not attend group Interactions with others: pt did not attend group Mood/Affect:  Triggers (if applicable):  Cognition:  Progress:  Response:  Plan:   Patients Problems:  Patient Active Problem List   Diagnosis Date Noted   Rhabdomyolysis 06/25/2021   Drug overdose, accidental or unintentional, initial encounter 06/25/2021   AKI (acute kidney injury) (HCC) 06/25/2021   Leukocytosis 06/25/2021   Elevated LFTs 06/25/2021   Hyperglycemia 06/25/2021   Hearing loss 06/25/2021   [redacted] weeks gestation of pregnancy 12/17/2018   Normal labor 12/16/2018   Substance induced mood disorder (HCC)    Cocaine use disorder, severe, dependence (HCC) 02/22/2018   Major depressive disorder, single episode, severe without psychotic features (HCC) 02/22/2018   Major depressive disorder, single episode, severe without psychosis (HCC) 02/22/2018   Indication for care in labor or delivery 11/13/2017   Pregnancy with adoption planned 10/05/2017   IUGR (intrauterine growth restriction) affecting care of mother 10/05/2017   Supervision of high risk pregnancy, antepartum 08/02/2017   Polysubstance abuse (HCC) 07/29/2017   Homeless 11/02/2016

## 2023-04-01 NOTE — ED Notes (Signed)
 Pt seemed irritable later in the day, requested to leave tx today, but then change her mind reported not "feeling well, Pt lying in bed quietly and asked questions regarding Suboxone.

## 2023-04-01 NOTE — ED Notes (Signed)
 Patient is resting in bed with eyes closed without any distress. No s/s of discomfort. Staff will continue to monitor safety and changes in condition.

## 2023-04-02 ENCOUNTER — Other Ambulatory Visit (HOSPITAL_BASED_OUTPATIENT_CLINIC_OR_DEPARTMENT_OTHER): Payer: Self-pay

## 2023-04-02 DIAGNOSIS — F1910 Other psychoactive substance abuse, uncomplicated: Secondary | ICD-10-CM | POA: Diagnosis not present

## 2023-04-02 DIAGNOSIS — F339 Major depressive disorder, recurrent, unspecified: Secondary | ICD-10-CM

## 2023-04-02 DIAGNOSIS — Z5900 Homelessness unspecified: Secondary | ICD-10-CM | POA: Diagnosis not present

## 2023-04-02 DIAGNOSIS — R42 Dizziness and giddiness: Secondary | ICD-10-CM | POA: Diagnosis not present

## 2023-04-02 LAB — CULTURE, BLOOD (ROUTINE X 2)
Culture: NO GROWTH
Culture: NO GROWTH
Special Requests: ADEQUATE

## 2023-04-02 MED ORDER — OLANZAPINE 10 MG PO TABS
10.0000 mg | ORAL_TABLET | Freq: Two times a day (BID) | ORAL | 0 refills | Status: DC
  Filled 2023-04-02: qty 30, 15d supply, fill #0

## 2023-04-02 MED ORDER — HYDROXYZINE HCL 25 MG PO TABS
25.0000 mg | ORAL_TABLET | Freq: Four times a day (QID) | ORAL | 0 refills | Status: DC | PRN
  Filled 2023-04-02: qty 30, 8d supply, fill #0

## 2023-04-02 NOTE — ED Notes (Signed)
 Patient alert & oriented x4. Denies intent to harm self or others when asked. Denies A/VH. Patient reports detox s/s of fatigue, stomach pains, and anxiety. Patient requesting d/c, provider made aware. No acute distress noted. Support and encouragement provided. Routine safety checks conducted per facility protocol. Encouraged patient to notify staff if any thoughts of harm towards self or others arise. Patient verbalizes understanding and agreement.
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
## 2023-04-02 NOTE — ED Notes (Signed)
 Patient discharged home per MD order. After Visit Summary (AVS) printed and given to patient, as well as printed prescriptions. AVS reviewed with patient and all questions fully answered. Patient discharged in no acute distress, A& O x4 and ambulatory. Patient denied SI/HI, A/VH upon discharge. Patient verbalized understanding of all discharge instructions explained by staff, RX's and safety plan. Patient mood fair. Patient belongings returned to patient from locker #25 complete and intact. Patient escorted to lobby via staff for transport to destination. Safety maintained.

## 2023-04-02 NOTE — Group Note (Signed)
 Group Topic: Relapse and Recovery  Group Date: 04/02/2023 Start Time: 1345 End Time: 1348 Facilitators: Ahmaad Neidhardt, Jacklynn Barnacle, RN  Department: [facility]  Number of Participants: 1  Group Focus: discharge education Treatment Modality:  Individual Therapy Interventions utilized were patient education Purpose: Discharge education  Name: Cassidy Underwood [facility] Date of Birth: 12-17-1995  MR: [id number]    Level of Participation: active Quality of Participation: attentive Interactions with others: gave feedback Mood/Affect: appropriate Triggers (if applicable): None identified Cognition: coherent/clear Progress: Gaining insight Response: Patient voiced understanding of all discharge instructions and medication instructions. Plan: patient will be encouraged to reach out for assistance if needed after discharge   Patients Problems:  Patient Active Problem List   Diagnosis Date Noted   Recurrent major depressive disorder (HCC) 04/02/2023   Rhabdomyolysis 06/25/2021   Drug overdose, accidental or unintentional, initial encounter 06/25/2021   AKI (acute kidney injury) (HCC) 06/25/2021   Leukocytosis 06/25/2021   Elevated LFTs 06/25/2021   Hyperglycemia 06/25/2021   Hearing loss 06/25/2021   [redacted] weeks gestation of pregnancy 12/17/2018   Normal labor 12/16/2018   Substance induced mood disorder (HCC)    Cocaine use disorder, severe, dependence (HCC) 02/22/2018   Major depressive disorder, single episode, severe without psychotic features (HCC) 02/22/2018   Major depressive disorder, single episode, severe without psychosis (HCC) 02/22/2018   Indication for care in labor or delivery 11/13/2017   Pregnancy with adoption planned 10/05/2017   IUGR (intrauterine growth restriction) affecting care of mother 10/05/2017   Supervision of high risk pregnancy, antepartum 08/02/2017   Polysubstance abuse (HCC) 07/29/2017   Homelessness 11/02/2016

## 2023-04-02 NOTE — ED Notes (Signed)
 Patient is seen sitting in the dining/milieu area. She is alert and oriented x3, not to time and denies SIHI, AVH, and depression and reports anxiety 8. She reports her mood as "anxious" she and cooperative with flat affect. She reports her medications are helping without any side effects and reports withdrawal symptoms of irritability and anxiety. She reports feeling light head when she is out of bed. She also reports sleeping off and on, and denies having nigh mares. The patient reports eating better today but she is still having difficulties swallowing pills. She reports her last BM was on 2/15. Staff will continue to monitor safety and changes in condition.

## 2023-04-02 NOTE — Group Note (Signed)
 Group Topic: Wellness  Group Date: 04/02/2023 Start Time: 2000 End Time: 2030 Facilitators: Concepcion Elk, LPN  Department: [facility]  Number of Participants: 0  Group Focus:  Treatment Modality:   Interventions utilized were  Purpose:   Name: Cassidy Underwood [facility] Date of Birth: [birth year]  MR: [id number]    Level of Participation: did no6t attend Quality of Participation:  Interactions with others:  Mood/Affect:  Triggers (if applicable):  Cognition:  Progress:  Response:  Plan:   Patients Problems:  Patient Active Problem List   Diagnosis Date Noted   Rhabdomyolysis 06/25/2021   Drug overdose, accidental or unintentional, initial encounter 06/25/2021   AKI (acute kidney injury) (HCC) 06/25/2021   Leukocytosis 06/25/2021   Elevated LFTs 06/25/2021   Hyperglycemia 06/25/2021   Hearing loss 06/25/2021   [redacted] weeks gestation of pregnancy 12/17/2018   Normal labor 12/16/2018   Substance induced mood disorder (HCC)    Cocaine use disorder, severe, dependence (HCC) 02/22/2018   Major depressive disorder, single episode, severe without psychotic features (HCC) 02/22/2018   Major depressive disorder, single episode, severe without psychosis (HCC) 02/22/2018   Indication for care in labor or delivery 11/13/2017   Pregnancy with adoption planned 10/05/2017   IUGR (intrauterine growth restriction) affecting care of mother 10/05/2017   Supervision of high risk pregnancy, antepartum 08/02/2017   Polysubstance abuse (HCC) 07/29/2017   Homeless 11/02/2016

## 2023-04-02 NOTE — ED Notes (Signed)
 Patient resting with eyes closed in no apparent acute distress. Respirations even and unlabored. Environment secured. Safety checks in place according to facility policy.

## 2023-04-02 NOTE — ED Provider Notes (Incomplete)
 FBC/OBS ASAP Discharge Summary  Date and Time: 04/02/2023 12:19 PM  Name: Cassidy Underwood [facility]  MRN:  [id number]   Discharge Diagnoses:  Final diagnoses:  Polysubstance abuse (HCC)  Recurrent major depressive disorder, remission status unspecified (HCC)  Homelessness   HPI: Cassidy Underwood is a 28 y.o. female with a history of substance abuse (cocaine, heroin, opioid) and homeless for the last 10 years. Patient reports that she has been using crack cocaine for 8-10 years, heroin and fentanyl for the past 1-2 years and trank which is a form of horse tranquilizer off and on in the last month.  Her last use of trank was 03/28/2023. Her last use of heroin was 03/29/2023 at 8 AM prior to arrival to the [facility] facility based crisis center.  Subjective:   Stay Summary: During the course of the hospitalization patient is doing well at this tiime despite her persistent substance use. She denies any withdrawal symptoms and has some improved insight by seeking detox and appears motivated to complete program. She is focused on her discharge today and through motival interviewing it has been determined that patient ideally would like to discharge today and is no longer interested in detox services. Her mood is euthymic and she denies any SI, HI or AVH on multiple occasions through her stay. She does not appear to be future oriented and has no intent on seeking rehab at residential facility.  Through stages of changes interviewing she is in her contemplation stage of change, with no commitment to action. While this patient presents with risk factors that history of substance abuse, treatment non-compliance and chronic poor judgment, these are mitigated by protective factors which include no known access to weapons or firearms, presence of an available support system (his girlfriend) and patients agreement with treatment recommendations, safety plan and need for follow-up care at.    Total Time spent with  patient: {Time; 15 min - 8 hours:17441}  Past Psychiatric History: *** Past Medical History: *** Family History: *** Family Psychiatric History: *** Social History: *** Tobacco Cessation:  {Discharge tobacco cessation prescription:304700209}  Current Medications:  Current Facility-Administered Medications  Medication Dose Route Frequency Provider Last Rate Last Admin   acetaminophen (TYLENOL) tablet 650 mg  650 mg Oral Q6H PRN Sindy Guadeloupe, NP       alum & mag hydroxide-simeth (MAALOX/MYLANTA) 200-200-20 MG/5ML suspension 30 mL  30 mL Oral Q4H PRN Sindy Guadeloupe, NP       antiseptic oral rinse (BIOTENE) solution 15 mL  15 mL Mouth Rinse PRN Lamar Sprinkles, MD   15 mL at 04/01/23 2112   cloNIDine (CATAPRES) tablet 0.1 mg  0.1 mg Oral Elgie Collard, NP   0.1 mg at 04/02/23 6295   Followed by   Melene Muller ON 04/03/2023] cloNIDine (CATAPRES) tablet 0.1 mg  0.1 mg Oral QAC breakfast Sindy Guadeloupe, NP       dicyclomine (BENTYL) tablet 20 mg  20 mg Oral Q6H PRN Lamar Sprinkles, MD   20 mg at 04/02/23 0304   hydrOXYzine (ATARAX) tablet 25 mg  25 mg Oral Q6H PRN Lamar Sprinkles, MD   25 mg at 04/02/23 0304   loperamide (IMODIUM) capsule 2-4 mg  2-4 mg Oral PRN Lamar Sprinkles, MD       magnesium hydroxide (MILK OF MAGNESIA) suspension 30 mL  30 mL Oral Daily PRN Sindy Guadeloupe, NP       methocarbamol (ROBAXIN) tablet 500 mg  500 mg Oral Q8H PRN Sindy Guadeloupe, NP  naproxen (NAPROSYN) tablet 500 mg  500 mg Oral BID WC Zouev, Dmitri, MD   500 mg at 04/02/23 0946   OLANZapine (ZYPREXA) injection 10 mg  10 mg Intramuscular TID PRN Sindy Guadeloupe, NP       OLANZapine (ZYPREXA) injection 5 mg  5 mg Intramuscular TID PRN Sindy Guadeloupe, NP       OLANZapine (ZYPREXA) tablet 10 mg  10 mg Oral BID Mariel Craft, MD   10 mg at 04/02/23 0946   OLANZapine zydis (ZYPREXA) disintegrating tablet 5 mg  5 mg Oral TID PRN Sindy Guadeloupe, NP       ondansetron (ZOFRAN-ODT) disintegrating tablet 4 mg  4 mg Oral  Q6H PRN Sindy Guadeloupe, NP       traZODone (DESYREL) tablet 50 mg  50 mg Oral QHS PRN Ajibola, Ene A, NP   50 mg at 04/01/23 2309   Current Outpatient Medications  Medication Sig Dispense Refill   Buprenorphine HCl-Naloxone HCl (SUBOXONE) 8-2 MG FILM Place 1 Film under the tongue daily. (Patient not taking: Reported on 03/30/2023)     cephALEXin (KEFLEX) 500 MG capsule Take 1 capsule (500 mg total) by mouth 4 (four) times daily. (Patient not taking: Reported on 03/30/2023) 20 capsule 0   chlordiazePOXIDE (LIBRIUM) 25 MG capsule Take two capsules by mouth three times daily for one day, then one to two capsules twice daily for one day, then one to two capsules once daily for one day. (Patient not taking: Reported on 03/30/2023) 12 capsule 0    PTA Medications:  Facility Ordered Medications  Medication   acetaminophen (TYLENOL) tablet 650 mg   alum & mag hydroxide-simeth (MAALOX/MYLANTA) 200-200-20 MG/5ML suspension 30 mL   magnesium hydroxide (MILK OF MAGNESIA) suspension 30 mL   methocarbamol (ROBAXIN) tablet 500 mg   ondansetron (ZOFRAN-ODT) disintegrating tablet 4 mg   [EXPIRED] cloNIDine (CATAPRES) tablet 0.1 mg   Followed by   cloNIDine (CATAPRES) tablet 0.1 mg   Followed by   Melene Muller ON 04/03/2023] cloNIDine (CATAPRES) tablet 0.1 mg   OLANZapine zydis (ZYPREXA) disintegrating tablet 5 mg   OLANZapine (ZYPREXA) injection 5 mg   OLANZapine (ZYPREXA) injection 10 mg   [COMPLETED] OLANZapine (ZYPREXA) tablet 5 mg   [COMPLETED] OLANZapine (ZYPREXA) tablet 5 mg   OLANZapine (ZYPREXA) tablet 10 mg   antiseptic oral rinse (BIOTENE) solution 15 mL   loperamide (IMODIUM) capsule 2-4 mg   hydrOXYzine (ATARAX) tablet 25 mg   dicyclomine (BENTYL) tablet 20 mg   naproxen (NAPROSYN) tablet 500 mg   traZODone (DESYREL) tablet 50 mg   PTA Medications  Medication Sig   cephALEXin (KEFLEX) 500 MG capsule Take 1 capsule (500 mg total) by mouth 4 (four) times daily. (Patient not taking: Reported on  03/30/2023)   chlordiazePOXIDE (LIBRIUM) 25 MG capsule Take two capsules by mouth three times daily for one day, then one to two capsules twice daily for one day, then one to two capsules once daily for one day. (Patient not taking: Reported on 03/30/2023)   Buprenorphine HCl-Naloxone HCl (SUBOXONE) 8-2 MG FILM Place 1 Film under the tongue daily. (Patient not taking: Reported on 03/30/2023)       03/29/2023    8:15 PM  Depression screen PHQ 2/9  Decreased Interest 0  Down, Depressed, Hopeless 1  PHQ - 2 Score 1  Altered sleeping 0  Tired, decreased energy 3  Change in appetite 3  Feeling bad or failure about yourself  2  Trouble concentrating 2  Moving slowly  or fidgety/restless 3  Suicidal thoughts 1  PHQ-9 Score 15    Flowsheet Row ED from 03/29/2023 in [facility] ED from 03/28/2023 in [facility] Emergency Department at [facility] ED from 08/13/2022 in [facility] Emergency Department at [facility]  C-SSRS RISK CATEGORY No Risk No Risk No Risk       Musculoskeletal  Strength & Muscle Tone: {desc; muscle tone:32375} Gait & Station: {PE GAIT ED ZOXW:96045} Patient leans: {Patient Leans:21022755}  Psychiatric Specialty Exam  Presentation  General Appearance:  Appropriate for Environment; Casual  Eye Contact: Good  Speech: Clear and Coherent; Normal Rate  Speech Volume: Normal  Handedness: Right   Mood and Affect  Mood: Anxious  Affect: Appropriate; Full Range   Thought Process  Thought Processes: Coherent; Linear; Goal Directed  Descriptions of Associations:Intact  Orientation:Full (Time, Place and Person)  Thought Content:Logical; WDL  Diagnosis of Schizophrenia or Schizoaffective disorder in past: No    Hallucinations:Hallucinations: None  Ideas of Reference:None  Suicidal Thoughts:Suicidal Thoughts: No  Homicidal Thoughts:Homicidal Thoughts: No   Sensorium  Memory: Immediate Fair; Recent  Fair  Judgment: Fair  Insight: Fair; Shallow   Executive Functions  Concentration: Good  Attention Span: Good  Recall: Good  Fund of Knowledge: Good  Language: Good   Psychomotor Activity  Psychomotor Activity: Psychomotor Activity: Normal   Assets  Assets: Communication Skills; Desire for Improvement; Resilience   Sleep  Sleep: Sleep: Fair   No data recorded  Physical Exam  Physical Exam ROS Blood pressure 123/75, pulse 77, temperature (!) 97.4 F (36.3 C), temperature source Oral, resp. rate 16, SpO2 100%, unknown if currently breastfeeding. There is no height or weight on file to calculate BMI.  Demographic Factors:  {Demographic Factors:20662}  Loss Factors: {Loss Factors:20659}  Historical Factors: {Historical Factors:20660}  Risk Reduction Factors:   {Risk Reduction Factors:20661}  Continued Clinical Symptoms:  {Clinical Factors:22706}  Cognitive Features That Contribute To Risk:  {chl bhh Cognitive Features:304700251}    Suicide Risk:  {BHH SUICIDE WUJW:11914}  Plan Of Care/Follow-up recommendations:  {[facility] DC FU RECOMMENDATIONS:22620}  Disposition: ***  Maryagnes Amos, FNP 04/02/2023, 12:19 PM

## 2023-04-02 NOTE — ED Notes (Signed)
 Patient c/o stomach cramp, hot/cold sweats and anxiety. Hydroxyzine 25mg  PO and Bentyl 20mg  PO. Staff will continue to monitor safety and changes in her condition.

## 2023-04-03 ENCOUNTER — Other Ambulatory Visit (HOSPITAL_BASED_OUTPATIENT_CLINIC_OR_DEPARTMENT_OTHER): Payer: Self-pay

## 2023-04-04 ENCOUNTER — Other Ambulatory Visit (HOSPITAL_COMMUNITY): Payer: Self-pay

## 2023-05-17 ENCOUNTER — Other Ambulatory Visit (HOSPITAL_COMMUNITY)
Admission: EM | Admit: 2023-05-17 | Discharge: 2023-05-18 | Disposition: A | Discharge status: Other Acute Inpatient Hospital | Source: Home / Self Care | Attending: Psychiatry | Admitting: Psychiatry

## 2023-05-17 ENCOUNTER — Other Ambulatory Visit (HOSPITAL_COMMUNITY)
Admission: EM | Admit: 2023-05-17 | Discharge: 2023-05-17 | Disposition: A | Discharge status: Nursing Home (Includes ICF) | Attending: Psychiatry | Admitting: Psychiatry

## 2023-05-17 DIAGNOSIS — F1520 Other stimulant dependence, uncomplicated: Secondary | ICD-10-CM | POA: Insufficient documentation

## 2023-05-17 DIAGNOSIS — F1994 Other psychoactive substance use, unspecified with psychoactive substance-induced mood disorder: Secondary | ICD-10-CM | POA: Diagnosis present

## 2023-05-17 DIAGNOSIS — F19982 Other psychoactive substance use, unspecified with psychoactive substance-induced sleep disorder: Secondary | ICD-10-CM | POA: Insufficient documentation

## 2023-05-17 DIAGNOSIS — Z5900 Homelessness unspecified: Secondary | ICD-10-CM | POA: Insufficient documentation

## 2023-05-17 DIAGNOSIS — F411 Generalized anxiety disorder: Secondary | ICD-10-CM | POA: Diagnosis not present

## 2023-05-17 DIAGNOSIS — F17210 Nicotine dependence, cigarettes, uncomplicated: Secondary | ICD-10-CM | POA: Insufficient documentation

## 2023-05-17 DIAGNOSIS — F1910 Other psychoactive substance abuse, uncomplicated: Secondary | ICD-10-CM

## 2023-05-17 DIAGNOSIS — Z9151 Personal history of suicidal behavior: Secondary | ICD-10-CM | POA: Insufficient documentation

## 2023-05-17 DIAGNOSIS — F319 Bipolar disorder, unspecified: Secondary | ICD-10-CM | POA: Insufficient documentation

## 2023-05-17 DIAGNOSIS — F1310 Sedative, hypnotic or anxiolytic abuse, uncomplicated: Secondary | ICD-10-CM | POA: Insufficient documentation

## 2023-05-17 DIAGNOSIS — F1420 Cocaine dependence, uncomplicated: Secondary | ICD-10-CM | POA: Insufficient documentation

## 2023-05-17 DIAGNOSIS — T50901A Poisoning by unspecified drugs, medicaments and biological substances, accidental (unintentional), initial encounter: Secondary | ICD-10-CM | POA: Insufficient documentation

## 2023-05-17 DIAGNOSIS — F1120 Opioid dependence, uncomplicated: Secondary | ICD-10-CM

## 2023-05-17 LAB — CBC WITH DIFFERENTIAL/PLATELET
Abs Immature Granulocytes: 0.04 10*3/uL (ref 0.00–0.07)
Basophils Absolute: 0 10*3/uL (ref 0.0–0.1)
Basophils Relative: 0 %
Eosinophils Absolute: 0.3 10*3/uL (ref 0.0–0.5)
Eosinophils Relative: 4 %
HCT: 37.9 % (ref 36.0–46.0)
Hemoglobin: 12.7 g/dL (ref 12.0–15.0)
Immature Granulocytes: 0 %
Lymphocytes Relative: 41 %
Lymphs Abs: 3.9 10*3/uL (ref 0.7–4.0)
MCH: 29.6 pg (ref 26.0–34.0)
MCHC: 33.5 g/dL (ref 30.0–36.0)
MCV: 88.3 fL (ref 80.0–100.0)
Monocytes Absolute: 0.6 10*3/uL (ref 0.1–1.0)
Monocytes Relative: 6 %
Neutro Abs: 4.6 10*3/uL (ref 1.7–7.7)
Neutrophils Relative %: 49 %
Platelets: 355 10*3/uL (ref 150–400)
RBC: 4.29 MIL/uL (ref 3.87–5.11)
RDW: 12.2 % (ref 11.5–15.5)
WBC: 9.5 10*3/uL (ref 4.0–10.5)
nRBC: 0 % (ref 0.0–0.2)

## 2023-05-17 LAB — URINALYSIS, ROUTINE W REFLEX MICROSCOPIC
Bilirubin Urine: NEGATIVE
Glucose, UA: NEGATIVE mg/dL
Hgb urine dipstick: NEGATIVE
Ketones, ur: NEGATIVE mg/dL
Leukocytes,Ua: NEGATIVE
Nitrite: NEGATIVE
Protein, ur: NEGATIVE mg/dL
Specific Gravity, Urine: 1.025 (ref 1.005–1.030)
pH: 6 (ref 5.0–8.0)

## 2023-05-17 LAB — COMPREHENSIVE METABOLIC PANEL WITH GFR
ALT: 16 U/L (ref 0–44)
AST: 22 U/L (ref 15–41)
Albumin: 3.5 g/dL (ref 3.5–5.0)
Alkaline Phosphatase: 57 U/L (ref 38–126)
Anion gap: 10 (ref 5–15)
BUN: 8 mg/dL (ref 6–20)
CO2: 25 mmol/L (ref 22–32)
Calcium: 9 mg/dL (ref 8.9–10.3)
Chloride: 106 mmol/L (ref 98–111)
Creatinine, Ser: 0.77 mg/dL (ref 0.44–1.00)
GFR, Estimated: >60 mL/min (ref >60)
Glucose, Bld: 84 mg/dL (ref 70–99)
Potassium: 4.5 mmol/L (ref 3.5–5.1)
Sodium: 141 mmol/L (ref 135–145)
Total Bilirubin: 0.6 mg/dL (ref 0.0–1.2)
Total Protein: 6.3 g/dL — ABNORMAL LOW (ref 6.5–8.1)

## 2023-05-17 LAB — LIPID PANEL
Cholesterol: 142 mg/dL (ref 0–200)
HDL: 59 mg/dL (ref >40)
LDL Cholesterol: 36 mg/dL (ref 0–99)
Total CHOL/HDL Ratio: 2.4 ratio
Triglycerides: 237 mg/dL — ABNORMAL HIGH (ref <150)
VLDL: 47 mg/dL — ABNORMAL HIGH (ref 0–40)

## 2023-05-17 LAB — POCT URINE DRUG SCREEN - MANUAL ENTRY (I-SCREEN)
POC Amphetamine UR: NOT DETECTED
POC Buprenorphine (BUP): NOT DETECTED
POC Cocaine UR: POSITIVE — AB
POC Marijuana UR: NOT DETECTED
POC Methadone UR: NOT DETECTED
POC Methamphetamine UR: NOT DETECTED
POC Morphine: POSITIVE — AB
POC Oxazepam (BZO): NOT DETECTED
POC Oxycodone UR: NOT DETECTED
POC Secobarbital (BAR): NOT DETECTED

## 2023-05-17 LAB — POC URINE PREG, ED: Preg Test, Ur: NEGATIVE

## 2023-05-17 LAB — ETHANOL: Alcohol, Ethyl (B): <10 mg/dL (ref <10)

## 2023-05-17 LAB — TSH: TSH: 0.589 u[IU]/mL (ref 0.350–4.500)

## 2023-05-17 MED ORDER — MAGNESIUM HYDROXIDE 400 MG/5ML PO SUSP: 30.0000 mL | Freq: Every day | ORAL | Status: DC | PRN

## 2023-05-17 MED ORDER — DIPHENHYDRAMINE HCL 50 MG/ML IJ SOLN: 50.0000 mg | Freq: Three times a day (TID) | INTRAMUSCULAR | Status: DC | PRN

## 2023-05-17 MED ORDER — CLONIDINE HCL 0.1 MG PO TABS
0.1000 mg | ORAL_TABLET | Freq: Four times a day (QID) | ORAL | Status: DC
  Administered 2023-05-18: 0.1 mg via ORAL

## 2023-05-17 MED ORDER — HYDROXYZINE HCL 25 MG PO TABS
25.0000 mg | ORAL_TABLET | Freq: Three times a day (TID) | ORAL | Status: DC | PRN
  Administered 2023-05-18: 25 mg via ORAL
  Filled 2023-05-17: qty 1

## 2023-05-17 MED ORDER — TRAZODONE HCL 100 MG PO TABS: 100.0000 mg | ORAL_TABLET | Freq: Every evening | ORAL | Status: DC | PRN

## 2023-05-17 MED ORDER — LORAZEPAM 2 MG/ML IJ SOLN: 2.0000 mg | Freq: Three times a day (TID) | INTRAMUSCULAR | Status: DC | PRN

## 2023-05-17 MED ORDER — CLONIDINE HCL 0.1 MG PO TABS
0.1000 mg | ORAL_TABLET | Freq: Four times a day (QID) | ORAL | Status: DC
  Filled 2023-05-17: qty 1

## 2023-05-17 MED ORDER — CLONIDINE HCL 0.1 MG PO TABS: 0.1000 mg | ORAL_TABLET | ORAL | Status: DC

## 2023-05-17 MED ORDER — ACETAMINOPHEN 325 MG PO TABS: 650.0000 mg | ORAL_TABLET | Freq: Four times a day (QID) | ORAL | Status: DC | PRN

## 2023-05-17 MED ORDER — ONDANSETRON 4 MG PO TBDP: 4.0000 mg | ORAL_TABLET | Freq: Four times a day (QID) | ORAL | Status: DC | PRN

## 2023-05-17 MED ORDER — ALUM & MAG HYDROXIDE-SIMETH 200-200-20 MG/5ML PO SUSP: 30.0000 mL | ORAL | Status: DC | PRN

## 2023-05-17 MED ORDER — HALOPERIDOL LACTATE 5 MG/ML IJ SOLN: 5.0000 mg | Freq: Three times a day (TID) | INTRAMUSCULAR | Status: DC | PRN

## 2023-05-17 MED ORDER — CLONIDINE HCL 0.1 MG PO TABS: 0.1000 mg | ORAL_TABLET | Freq: Every day | ORAL | Status: DC

## 2023-05-17 MED ORDER — NAPROXEN 500 MG PO TABS: 500.0000 mg | ORAL_TABLET | Freq: Two times a day (BID) | ORAL | Status: DC | PRN

## 2023-05-17 MED ORDER — HYDROXYZINE HCL 25 MG PO TABS: 25.0000 mg | ORAL_TABLET | Freq: Three times a day (TID) | ORAL | Status: DC | PRN

## 2023-05-17 MED ORDER — TRAZODONE HCL 50 MG PO TABS
50.0000 mg | ORAL_TABLET | Freq: Every evening | ORAL | Status: DC | PRN
  Filled 2023-05-17: qty 1

## 2023-05-17 MED ORDER — DICYCLOMINE HCL 20 MG PO TABS: 20.0000 mg | ORAL_TABLET | Freq: Four times a day (QID) | ORAL | Status: DC | PRN

## 2023-05-17 MED ORDER — LOPERAMIDE HCL 2 MG PO CAPS
2.0000 mg | ORAL_CAPSULE | ORAL | Status: DC | PRN
Start: 2023-05-17 — End: 2023-05-22

## 2023-05-17 MED ORDER — HALOPERIDOL 5 MG PO TABS: 5.0000 mg | ORAL_TABLET | Freq: Three times a day (TID) | ORAL | Status: DC | PRN

## 2023-05-17 MED ORDER — HYDROXYZINE HCL 25 MG PO TABS: 50.0000 mg | ORAL_TABLET | Freq: Four times a day (QID) | ORAL | Status: DC | PRN

## 2023-05-17 MED ORDER — DIPHENHYDRAMINE HCL 50 MG PO CAPS: 50.0000 mg | ORAL_CAPSULE | Freq: Three times a day (TID) | ORAL | Status: DC | PRN

## 2023-05-17 MED ORDER — HYDROXYZINE HCL 25 MG PO TABS: 25.0000 mg | ORAL_TABLET | Freq: Four times a day (QID) | ORAL | Status: DC | PRN

## 2023-05-17 MED ORDER — METHOCARBAMOL 500 MG PO TABS
500.0000 mg | ORAL_TABLET | Freq: Three times a day (TID) | ORAL | Status: DC | PRN
  Administered 2023-05-18: 500 mg via ORAL
  Filled 2023-05-17: qty 1

## 2023-05-17 MED ORDER — NAPROXEN 500 MG PO TABS: 500.0000 mg | ORAL_TABLET | Freq: Three times a day (TID) | ORAL | Status: DC | PRN

## 2023-05-17 MED ORDER — HALOPERIDOL LACTATE 5 MG/ML IJ SOLN: 10.0000 mg | Freq: Three times a day (TID) | INTRAMUSCULAR | Status: DC | PRN

## 2023-05-17 MED ORDER — METHOCARBAMOL 500 MG PO TABS: 1000.0000 mg | ORAL_TABLET | Freq: Four times a day (QID) | ORAL | Status: DC | PRN

## 2023-05-17 MED ORDER — LOPERAMIDE HCL 2 MG PO CAPS: 2.0000 mg | ORAL_CAPSULE | ORAL | Status: DC | PRN

## 2023-05-17 MED ORDER — ACETAMINOPHEN 325 MG PO TABS
650.0000 mg | ORAL_TABLET | Freq: Four times a day (QID) | ORAL | Status: DC | PRN
Start: 2023-05-17 — End: 2023-05-18
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
## 2023-05-17 NOTE — Group Note (Signed)
 Group Topic: Feelings about Diagnosis  Group Date: 05/17/2023 Start Time: 2000 End Time: 2152 Facilitators: Rae Lips B  Department: [facility]  Number of Participants: 5  Group Focus: abuse issues, acceptance, activities of daily living skills, anxiety, art therapy, check in, communication, coping skills, family, feeling awareness/expression, forgiveness, goals/reality orientation, healthy friendships, personal responsibility, problem solving, reminiscence, safety plan, self-awareness, self-esteem, social skills, and substance abuse education Treatment Modality:  Exposure Therapy, Leisure Counsellor, Optician, dispensing, and Spiritual Interventions utilized were leisure development, patient education, problem solving, reality testing, reminiscence, story telling, and support Purpose: enhance coping skills, express feelings, express irrational fears, improve communication skills, increase insight, regain self-worth, reinforce self-care, relapse prevention strategies, and trigger / craving management  Name: Marylynn Rigdon [facility] Date of Birth: [birth year]  MR: [id number]    Level of Participation: active Quality of Participation: attentive, cooperative, initiates communication, motivated, and offered feedback Interactions with others: gave feedback Mood/Affect: appropriate Triggers (if applicable): NA Cognition: coherent/clear Progress: Gaining insight Response: NA Plan: patient will be encouraged to keep going to groups.   Patients Problems:  Patient Active Problem List   Diagnosis Date Noted   Recurrent major depressive disorder (HCC) 04/02/2023   Rhabdomyolysis 06/25/2021   Drug overdose, accidental or unintentional, initial encounter 06/25/2021   AKI (acute kidney injury) (HCC) 06/25/2021   Leukocytosis 06/25/2021   Elevated LFTs 06/25/2021   Hyperglycemia 06/25/2021   Hearing loss 06/25/2021   [redacted] weeks gestation of pregnancy 12/17/2018   Normal labor  12/16/2018   Substance induced mood disorder (HCC)    Cocaine use disorder, severe, dependence (HCC) 02/22/2018   Major depressive disorder, single episode, severe without psychotic features (HCC) 02/22/2018   Major depressive disorder, single episode, severe without psychosis (HCC) 02/22/2018   Indication for care in labor or delivery 11/13/2017   Pregnancy with adoption planned 10/05/2017   IUGR (intrauterine growth restriction) affecting care of mother 10/05/2017   Supervision of high risk pregnancy, antepartum 08/02/2017   Polysubstance abuse (HCC) 07/29/2017   Homelessness 11/02/2016

## 2023-05-18 ENCOUNTER — Other Ambulatory Visit: Payer: Self-pay

## 2023-05-18 ENCOUNTER — Other Ambulatory Visit (HOSPITAL_COMMUNITY)
Admission: EM | Admit: 2023-05-18 | Discharge: 2023-05-23 | Disposition: A | Discharge status: Home / Self Care | Attending: Psychiatry | Admitting: Psychiatry

## 2023-05-18 ENCOUNTER — Encounter (HOSPITAL_COMMUNITY): Payer: Self-pay

## 2023-05-18 ENCOUNTER — Emergency Department (HOSPITAL_COMMUNITY)
Admission: EM | Admit: 2023-05-18 | Discharge: 2023-05-18 | Disposition: A | Discharge status: Other Acute Inpatient Hospital | Payer: Self-pay | Attending: Emergency Medicine | Admitting: Emergency Medicine

## 2023-05-18 DIAGNOSIS — Z9151 Personal history of suicidal behavior: Secondary | ICD-10-CM

## 2023-05-18 DIAGNOSIS — F322 Major depressive disorder, single episode, severe without psychotic features: Secondary | ICD-10-CM | POA: Diagnosis present

## 2023-05-18 DIAGNOSIS — F1420 Cocaine dependence, uncomplicated: Secondary | ICD-10-CM | POA: Diagnosis present

## 2023-05-18 DIAGNOSIS — F1193 Opioid use, unspecified with withdrawal: Secondary | ICD-10-CM

## 2023-05-18 DIAGNOSIS — F1310 Sedative, hypnotic or anxiolytic abuse, uncomplicated: Secondary | ICD-10-CM | POA: Diagnosis present

## 2023-05-18 DIAGNOSIS — F1123 Opioid dependence with withdrawal: Secondary | ICD-10-CM | POA: Insufficient documentation

## 2023-05-18 DIAGNOSIS — T50901A Poisoning by unspecified drugs, medicaments and biological substances, accidental (unintentional), initial encounter: Secondary | ICD-10-CM | POA: Diagnosis present

## 2023-05-18 DIAGNOSIS — F1423 Cocaine dependence with withdrawal: Secondary | ICD-10-CM | POA: Diagnosis not present

## 2023-05-18 DIAGNOSIS — J45909 Unspecified asthma, uncomplicated: Secondary | ICD-10-CM | POA: Insufficient documentation

## 2023-05-18 DIAGNOSIS — I959 Hypotension, unspecified: Secondary | ICD-10-CM | POA: Insufficient documentation

## 2023-05-18 DIAGNOSIS — F1920 Other psychoactive substance dependence, uncomplicated: Secondary | ICD-10-CM | POA: Insufficient documentation

## 2023-05-18 DIAGNOSIS — F1910 Other psychoactive substance abuse, uncomplicated: Secondary | ICD-10-CM | POA: Diagnosis present

## 2023-05-18 DIAGNOSIS — F19982 Other psychoactive substance use, unspecified with psychoactive substance-induced sleep disorder: Secondary | ICD-10-CM | POA: Diagnosis not present

## 2023-05-18 DIAGNOSIS — F1520 Other stimulant dependence, uncomplicated: Secondary | ICD-10-CM | POA: Diagnosis present

## 2023-05-18 DIAGNOSIS — Z5900 Homelessness unspecified: Secondary | ICD-10-CM

## 2023-05-18 DIAGNOSIS — F1410 Cocaine abuse, uncomplicated: Secondary | ICD-10-CM | POA: Diagnosis present

## 2023-05-18 DIAGNOSIS — F1994 Other psychoactive substance use, unspecified with psychoactive substance-induced mood disorder: Secondary | ICD-10-CM | POA: Diagnosis not present

## 2023-05-18 DIAGNOSIS — F411 Generalized anxiety disorder: Secondary | ICD-10-CM | POA: Diagnosis not present

## 2023-05-18 DIAGNOSIS — Z8659 Personal history of other mental and behavioral disorders: Secondary | ICD-10-CM

## 2023-05-18 DIAGNOSIS — F319 Bipolar disorder, unspecified: Secondary | ICD-10-CM | POA: Diagnosis not present

## 2023-05-18 LAB — HEMOGLOBIN A1C
Hgb A1c MFr Bld: 5.2 % (ref 4.8–5.6)
Mean Plasma Glucose: 102.54 mg/dL

## 2023-05-18 MED ORDER — HALOPERIDOL LACTATE 5 MG/ML IJ SOLN
5.0000 mg | Freq: Three times a day (TID) | INTRAMUSCULAR | Status: DC | PRN
  Administered 2023-05-18: 5 mg via INTRAMUSCULAR
  Filled 2023-05-18: qty 1

## 2023-05-18 MED ORDER — KETOROLAC TROMETHAMINE 15 MG/ML IJ SOLN
15.0000 mg | Freq: Once | INTRAMUSCULAR | Status: AC
  Administered 2023-05-18: 15 mg via INTRAVENOUS
  Filled 2023-05-18: qty 1

## 2023-05-18 MED ORDER — ACETAMINOPHEN 325 MG PO TABS
650.0000 mg | ORAL_TABLET | Freq: Four times a day (QID) | ORAL | Status: DC | PRN
  Administered 2023-05-19: 650 mg via ORAL
  Filled 2023-05-18: qty 2

## 2023-05-18 MED ORDER — NAPROXEN 500 MG PO TABS
500.0000 mg | ORAL_TABLET | Freq: Two times a day (BID) | ORAL | Status: DC | PRN
  Administered 2023-05-19 – 2023-05-21 (×3): 500 mg via ORAL
  Filled 2023-05-18 (×3): qty 1

## 2023-05-18 MED ORDER — LACTATED RINGERS IV BOLUS
1000.0000 mL | Freq: Once | INTRAVENOUS | Status: AC
  Administered 2023-05-18: 1000 mL via INTRAVENOUS

## 2023-05-18 MED ORDER — ONDANSETRON HCL 4 MG/2ML IJ SOLN
4.0000 mg | Freq: Once | INTRAMUSCULAR | Status: AC
  Administered 2023-05-18: 4 mg via INTRAVENOUS
  Filled 2023-05-18: qty 2

## 2023-05-18 MED ORDER — LORAZEPAM 2 MG/ML IJ SOLN
2.0000 mg | Freq: Three times a day (TID) | INTRAMUSCULAR | Status: DC | PRN
  Administered 2023-05-18: 2 mg via INTRAMUSCULAR
  Filled 2023-05-18: qty 1

## 2023-05-18 MED ORDER — MAGNESIUM HYDROXIDE 400 MG/5ML PO SUSP: 30.0000 mL | Freq: Every day | ORAL | Status: DC | PRN

## 2023-05-18 MED ORDER — DICYCLOMINE HCL 20 MG PO TABS
20.0000 mg | ORAL_TABLET | Freq: Four times a day (QID) | ORAL | Status: DC | PRN
  Administered 2023-05-20 (×2): 20 mg via ORAL
  Filled 2023-05-18 (×2): qty 1

## 2023-05-18 MED ORDER — DIPHENHYDRAMINE HCL 50 MG/ML IJ SOLN
50.0000 mg | Freq: Three times a day (TID) | INTRAMUSCULAR | Status: DC | PRN
  Administered 2023-05-18: 50 mg via INTRAMUSCULAR
  Filled 2023-05-18: qty 1

## 2023-05-18 MED ORDER — LOPERAMIDE HCL 2 MG PO CAPS
2.0000 mg | ORAL_CAPSULE | ORAL | Status: DC | PRN
  Administered 2023-05-20: 4 mg via ORAL
  Filled 2023-05-18: qty 2

## 2023-05-18 MED ORDER — ONDANSETRON 4 MG PO TBDP: 4.0000 mg | ORAL_TABLET | Freq: Four times a day (QID) | ORAL | Status: DC | PRN

## 2023-05-18 MED ORDER — ALUM & MAG HYDROXIDE-SIMETH 200-200-20 MG/5ML PO SUSP: 30.0000 mL | ORAL | Status: DC | PRN

## 2023-05-18 MED ORDER — OLANZAPINE 10 MG PO TABS
10.0000 mg | ORAL_TABLET | Freq: Every day | ORAL | Status: DC
  Administered 2023-05-18 – 2023-05-22 (×5): 10 mg via ORAL
  Filled 2023-05-18 (×5): qty 1

## 2023-05-18 MED ORDER — HYDROXYZINE HCL 25 MG PO TABS: 25.0000 mg | ORAL_TABLET | Freq: Four times a day (QID) | ORAL | Status: DC | PRN

## 2023-05-18 MED ORDER — HYDROXYZINE HCL 25 MG PO TABS
25.0000 mg | ORAL_TABLET | Freq: Three times a day (TID) | ORAL | Status: DC | PRN
  Administered 2023-05-18 – 2023-05-22 (×9): 25 mg via ORAL
  Filled 2023-05-18 (×9): qty 1

## 2023-05-18 NOTE — ED Notes (Signed)
 Patient observed/assessed in patient room lying down asleep in bed. Patient aroused with verbal stimulation to which patient responded in an agitated/annoyed demeanor. Patient evasive to preliminary assessment questions. Denies S/I and H/I. Denies A/V/H. Pt started cussing, agitated, saying she came here for help but no one wants to help her, even when she is withdrawing so bad and stated using the ' F words". Will continue to monitor for safety and provide support.

## 2023-05-18 NOTE — ED Notes (Signed)
 Pt is weak from withdrawing from heroin. Writer encouraged fluids and fed pt. Some cereal per patient request. Pt was able to take about ten spoons and refused to eat anymore.

## 2023-05-18 NOTE — ED Provider Notes (Signed)
 [facility] AT [facility] Provider Note   CSN: [id number] Arrival date & time: 05/18/23  0756     History  Chief Complaint  Patient presents with   Withdrawal   Hypotension    Cassidy Underwood is a 28 y.o. female.  HPI Patient with history of cocaine use and heroin use from behavioral health for hypotension.  Low blood pressures.  States she has not been eating and drinking much last few days.  Reviewing notes it appears she was started on the COWS withdrawal protocol and around 2:30 in the morning was given Robaxin hydroxyzine and clonidine.  Sent here for further evaluation.  Can return after treatment.   Past Medical History:  Diagnosis Date   Abscess    Asthma    Cocaine abuse (HCC)    Medical history non-contributory    Substance abuse (HCC)     Home Medications Prior to Admission medications   Medication Sig Start Date End Date Taking? Authorizing Provider  hydrOXYzine (ATARAX) 25 MG tablet Take 1 tablet (25 mg total) by mouth every 6 (six) hours as needed for anxiety or nausea. Patient not taking: Reported on 05/18/2023 04/02/23   Maryagnes Amos, FNP  OLANZapine (ZYPREXA) 10 MG tablet Take 1 tablet (10 mg total) by mouth 2 (two) times daily. Patient not taking: Reported on 05/18/2023 04/02/23   Maryagnes Amos, FNP      Allergies    Patient has no known allergies.    Review of Systems   Review of Systems  Physical Exam Updated Vital Signs BP 113/74   Pulse (!) 54   Temp 97.8 F (36.6 C)   Resp 14   Ht 4\' 8"  (1.422 m)   Wt 43.1 kg   SpO2 100%   Breastfeeding No   BMI 21.30 kg/m  Physical Exam Vitals and nursing note reviewed.  Cardiovascular:     Rate and Rhythm: Bradycardia present.  Pulmonary:     Breath sounds: No wheezing.  Abdominal:     Tenderness: There is no abdominal tenderness.  Neurological:     Mental Status: She is alert. Mental status is at baseline.     ED Results / Procedures /  Treatments   Labs (all labs ordered are listed, but only abnormal results are displayed) Labs Reviewed - No data to display  EKG EKG Interpretation Date/Time:  Friday May 18 2023 08:13:19 EDT Ventricular Rate:  54 PR Interval:  131 QRS Duration:  86 QT Interval:  445 QTC Calculation: 422 R Axis:   88  Text Interpretation: Sinus rhythm Nonspecific T abnrm, anterolateral leads ST elev, probable normal early repol pattern Confirmed by Benjiman Core [phone]) on 05/18/2023 2:55:01 PM  Radiology No results found.  Procedures Procedures    Medications Ordered in ED Medications  lactated ringers bolus 1,000 mL (0 mLs Intravenous Stopped 05/18/23 0956)  ondansetron (ZOFRAN) injection 4 mg (4 mg Intravenous Given 05/18/23 1034)  ketorolac (TORADOL) 15 MG/ML injection 15 mg (15 mg Intravenous Given 05/18/23 1402)    ED Course/ Medical Decision Making/ A&P                                 Medical Decision Making Risk Prescription drug management.   Patient with hypotension.  Has not had much oral intake recently but also recently given 3 medicines like a drop of blood pressure.  Reviewing notes it appears that the  blood pressure decreased after getting the medicines.  For now we will give fluid bolus.  Reviewed blood work from yesterday afternoon.  It was reassuring do not think we need to recheck at this time.  Reviewed notes from behavioral health.  Patient has been given some fluids.  Still having cold chills.  Afebrile.  Workup reassuring.  Blood pressure improved.  Likely hypotensive due to medications been given.  Appears stable for discharge back to the [facility]        Final Clinical Impression(s) / ED Diagnoses Final diagnoses:  Opioid withdrawal [facility])    Rx / DC Orders ED Discharge Orders     None         Benjiman Core, MD 05/18/23 1459

## 2023-05-18 NOTE — ED Notes (Signed)
 Pt ran out her room to the nursing station saying she doesn't feel right she's sweating and she said her heart beat/ pulse feels like its beating fast. RN took vitals MHT ran to get water and Gatorade and a cold washcloth to put over her face.

## 2023-05-18 NOTE — ED Notes (Signed)
 Pt sent back from [facility] for continued stay to [facility]. Pt reports using heroin and cocaine yesterday prior to admit to facility. Pt appears disheveled, poor hygiene and malnourished. Pt requesting meal. Skin assessment completed, pt escorted to her room after meal provided. Pt tolerated well. Pt constantly asking for medication for withdrawal. Pt observed tremulous and increased anxiety.VSS.  MD made aware. Awaiting orders. Pt denies SI/HI/AVH. Will continue to monitor for safety.

## 2023-05-18 NOTE — ED Notes (Signed)
 Pt pulse rechecked by nurse is 52. NP [place] notified
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
## 2023-05-18 NOTE — ED Provider Notes (Signed)
 Per admission  notes History of Present illness: Cassidy Underwood is a 28 y.o. female with a history of opioid and cocaine use disorder, who presented today at the [facility] behavioral health center seeking detox from substances of abuse.  Patient reports a history of reactive attachment disorder, bipolar disorder, ODD, and GAD    Assessment: During encounter with patient, she reports snorting heroin now for the past 1.5 years, reports that she uses 1.5 to 2 g daily.  Reports that last use was earlier today prior to arrival at the urgent care.  She reports also snorting cocaine x 7 years, using 1 g daily. Reports using 1.5 packs of cigarettes daily, denies any other substance use  Received report from patient's nurse this am, Pt presenting with low BP and HR . See VS Flowsheet Pt on Cows Protocol meds/Clonidine taper for heroin and cocaine abuse.   Recommend transfer to [facility] for medical clearance.   I spoke with Dr Madilyn Hook at [facility] and the provider has agreed to accept the patient.   Patient may return to [facility] to continue her detox treatment when medically stable.   EMTALA completed.

## 2023-05-18 NOTE — Group Note (Signed)
 Group Topic: Positive Affirmations  Group Date: 05/18/2023 Start Time: 2015 End Time: 2100 Facilitators: Darin Engels  Department: [facility]  Number of Participants: 3  Group Focus: leisure skills, self-esteem, and social skills Treatment Modality:  Leisure Development Interventions utilized were leisure development, reminiscence, story telling, and support Purpose: express feelings, express irrational fears, and regain self-worth  Name: Cassidy Underwood [facility] Date of Birth: 1995/05/09  MR: [id number]    Level of Participation: pt did not attend group  Quality of Participation: pt did not attend group  Interactions with others: minimal and positive  Mood/Affect: intermittent confusion  Triggers (if applicable): n/a Cognition: confused Progress: Other/ pt is having withdrawal symptoms Response: pt was not able to attend group due to withdrawals  Plan: patient will be encouraged to attend group   Patients Problems:  Patient Active Problem List   Diagnosis Date Noted   Methamphetamine dependence (HCC) 05/18/2023   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   Polysubstance dependence (HCC) 05/18/2023   Recurrent major depressive disorder (HCC) 04/02/2023   Rhabdomyolysis 06/25/2021   Drug overdose, accidental or unintentional, initial encounter 06/25/2021   AKI (acute kidney injury) (HCC) 06/25/2021   Leukocytosis 06/25/2021   Elevated LFTs 06/25/2021   Hyperglycemia 06/25/2021   Hearing loss 06/25/2021   [redacted] weeks gestation of pregnancy 12/17/2018   Normal labor 12/16/2018   Substance induced mood disorder (HCC)    Cocaine use disorder, severe, dependence (HCC) 02/22/2018   Major depressive disorder, single episode, severe without psychotic features (HCC) 02/22/2018   Major depressive disorder, single episode, severe without psychosis (HCC) 02/22/2018   Indication for care in labor or delivery 11/13/2017   Pregnancy with adoption planned 10/05/2017    IUGR (intrauterine growth restriction) affecting care of mother 10/05/2017   Supervision of high risk pregnancy, antepartum 08/02/2017   Polysubstance abuse (HCC) 07/29/2017   Homelessness 11/02/2016

## 2023-05-18 NOTE — ED Triage Notes (Signed)
 Pt presents to ED via EMS with heroin and cocaine detox from [facility] for 8 hours. Has not eaten or drank well in the last couple days per pt. EMS called due to reported hypotension.

## 2023-05-18 NOTE — ED Notes (Signed)
 Pt sleeping in no acute distress. RR even and unlabored. Environment secured. Will continue to monitor for safety.

## 2023-05-18 NOTE — ED Notes (Signed)
 Pt transferred to [facility]. Belongings sent with pt.

## 2023-05-18 NOTE — ED Notes (Addendum)
 Pt manual BP is 88/58 and pulse 51. NP [place] notified.

## 2023-05-18 NOTE — ED Notes (Signed)
 Patient is in her room. Respirations equal and unlabored, skin warm and dry, NAD. No change in assessment or acuity. Routine safety checks conducted according to facility protocol. Will continue to monitor for safety.

## 2023-05-19 ENCOUNTER — Encounter (HOSPITAL_COMMUNITY): Payer: Self-pay | Admitting: Psychiatry

## 2023-05-19 DIAGNOSIS — F1994 Other psychoactive substance use, unspecified with psychoactive substance-induced mood disorder: Secondary | ICD-10-CM | POA: Diagnosis not present

## 2023-05-19 DIAGNOSIS — F19982 Other psychoactive substance use, unspecified with psychoactive substance-induced sleep disorder: Secondary | ICD-10-CM | POA: Diagnosis present

## 2023-05-19 DIAGNOSIS — Z8659 Personal history of other mental and behavioral disorders: Secondary | ICD-10-CM

## 2023-05-19 DIAGNOSIS — F1410 Cocaine abuse, uncomplicated: Secondary | ICD-10-CM | POA: Diagnosis present

## 2023-05-19 DIAGNOSIS — F1423 Cocaine dependence with withdrawal: Secondary | ICD-10-CM | POA: Diagnosis not present

## 2023-05-19 DIAGNOSIS — F1520 Other stimulant dependence, uncomplicated: Secondary | ICD-10-CM | POA: Diagnosis not present

## 2023-05-19 DIAGNOSIS — Z9151 Personal history of suicidal behavior: Secondary | ICD-10-CM

## 2023-05-19 HISTORY — DX: Other psychoactive substance use, unspecified with psychoactive substance-induced sleep disorder: F19.982

## 2023-05-19 MED ORDER — CLONIDINE HCL 0.1 MG PO TABS
0.1000 mg | ORAL_TABLET | Freq: Four times a day (QID) | ORAL | Status: DC
  Administered 2023-05-19 – 2023-05-20 (×3): 0.1 mg via ORAL
  Filled 2023-05-19 (×3): qty 1

## 2023-05-19 MED ORDER — METHOCARBAMOL 500 MG PO TABS
500.0000 mg | ORAL_TABLET | Freq: Three times a day (TID) | ORAL | Status: DC | PRN
  Administered 2023-05-19 – 2023-05-22 (×5): 500 mg via ORAL
  Filled 2023-05-19 (×5): qty 1

## 2023-05-19 MED ORDER — BOOST / RESOURCE BREEZE PO LIQD CUSTOM
1.0000 | Freq: Three times a day (TID) | ORAL | Status: DC
Start: 2023-05-19 — End: 2023-05-23
  Administered 2023-05-19 – 2023-05-23 (×13): 1 via ORAL

## 2023-05-19 MED ORDER — CLONIDINE HCL 0.1 MG PO TABS: 0.1000 mg | ORAL_TABLET | ORAL | Status: DC

## 2023-05-19 MED ORDER — CLONIDINE HCL 0.1 MG PO TABS: 0.1000 mg | ORAL_TABLET | Freq: Every day | ORAL | Status: DC

## 2023-05-19 NOTE — ED Notes (Addendum)
 Pt calm and resting in the bedroom.Concern about her going through withdrawal, Reassured and medicated. Marland Kitchen NAD. Respirations even and unlabored. Will monitor for safety.

## 2023-05-19 NOTE — ED Provider Notes (Cosign Needed Addendum)
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 15 MG/ML injection 15 mg   acetaminophen (TYLENOL) tablet 650 mg   alum & mag hydroxide-simeth (MAALOX/MYLANTA) 200-200-20 MG/5ML suspension 30 mL   magnesium hydroxide (MILK OF MAGNESIA) suspension 30 mL   haloperidol lactate (HALDOL) injection 5 mg   And   diphenhydrAMINE (BENADRYL) injection 50 mg   And   LORazepam (ATIVAN) injection 2 mg   hydrOXYzine (ATARAX) tablet 25 mg   OLANZapine (ZYPREXA) tablet 10 mg   dicyclomine (BENTYL) tablet 20 mg   loperamide (IMODIUM) capsule 2-4 mg   naproxen (NAPROSYN) tablet 500 mg   ondansetron (ZOFRAN-ODT) disintegrating tablet 4 mg   methocarbamol (ROBAXIN) tablet 500 mg   cloNIDine (CATAPRES) tablet 0.1 mg   Followed by   Melene Muller ON 05/21/2023]  cloNIDine (CATAPRES) tablet 0.1 mg   Followed by   Melene Muller ON 05/24/2023] cloNIDine (CATAPRES) tablet 0.1 mg   feeding supplement (BOOST /  RESOURCE BREEZE) liquid 1 Container    Princess Bruins, DO Psych Resident, PGY-3 05/19/23  8:42 PM .

## 2023-05-19 NOTE — ED Notes (Signed)
 Patient resting with eyes closed in no apparent acute distress. Respirations even and unlabored. Environment secured. Safety checks in place according to facility policy.

## 2023-05-19 NOTE — Group Note (Signed)
 Group Topic: Relapse and Recovery  Group Date: 05/19/2023 Start Time: 2005 End Time: 2100 Facilitators: Rae Lips B  Department: [facility]  Number of Participants: 4  Group Focus: abuse issues, acceptance, and activities of daily living skills Treatment Modality:  Leisure Development Interventions utilized were leisure development Purpose: relapse prevention strategies  Name: Cassidy Underwood [facility] Date of Birth: [birth year]  MR: [id number]    Level of Participation: PT DID NOT ATTEND GROUPS Quality of Participation: cooperative Interactions with others: gave feedback Mood/Affect: appropriate Triggers (if applicable): NA Cognition: coherent/clear Progress: None Response: NA Plan: patient will be encouraged to go to groups.   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   Polysubstance dependence (HCC) 05/18/2023   Recurrent major depressive disorder (HCC) 04/02/2023   Rhabdomyolysis 06/25/2021   HO Drug overdose, accidental or unintentional 06/25/2021   AKI (acute kidney injury) (HCC) 06/25/2021   Leukocytosis 06/25/2021   Elevated LFTs 06/25/2021   Hyperglycemia 06/25/2021   Hearing loss 06/25/2021   [redacted] weeks gestation of pregnancy 12/17/2018   Normal labor 12/16/2018   Substance induced mood disorder (HCC)    Cocaine use disorder, severe, dependence (HCC) 02/22/2018   Major depressive disorder, single episode, severe without psychotic features (HCC) 02/22/2018   Major depressive disorder, single episode, severe without psychosis (HCC) 02/22/2018   Indication for care in labor or delivery 11/13/2017   Pregnancy with adoption planned 10/05/2017   IUGR (intrauterine growth restriction) affecting care of mother 10/05/2017   Supervision of high  risk pregnancy, antepartum 08/02/2017   Polysubstance abuse (HCC) 07/29/2017   Homelessness 11/02/2016

## 2023-05-19 NOTE — ED Notes (Signed)
 Patient in the Bedroom calm and resting. Requested for something to help with her withdrawal, vistaril given and pt reassured. Respirations even and unlabored. Environmental checked, secured and safe per policy. Will monitor for safety.

## 2023-05-19 NOTE — ED Notes (Signed)
 Patient is sleeping. Respirations equal and unlabored, skin warm and dry. No change in assessment or acuity. Routine safety checks conducted according to facility protocol. Will continue to monitor for safety.

## 2023-05-19 NOTE — ED Notes (Signed)
 Abnormal BP of 94/59 obtained. Patient asymptomatic, in no acute distress. Provider Princess Bruins, DO and Haze Rushing, MD made aware.

## 2023-05-19 NOTE — ED Notes (Addendum)
 Patient is sleeping. Respirations equal and unlabored, skin warm and dry.  Routine safety checks conducted according to facility protocol. Will continue to monitor for safety.

## 2023-05-19 NOTE — ED Notes (Signed)
 Mht was doing rounds pt asked for meds for withdrawal. MHT made RN aware.

## 2023-05-20 DIAGNOSIS — F1423 Cocaine dependence with withdrawal: Secondary | ICD-10-CM | POA: Diagnosis not present

## 2023-05-20 DIAGNOSIS — F1994 Other psychoactive substance use, unspecified with psychoactive substance-induced mood disorder: Secondary | ICD-10-CM | POA: Diagnosis not present

## 2023-05-20 DIAGNOSIS — F19982 Other psychoactive substance use, unspecified with psychoactive substance-induced sleep disorder: Secondary | ICD-10-CM | POA: Diagnosis not present

## 2023-05-20 DIAGNOSIS — F1520 Other stimulant dependence, uncomplicated: Secondary | ICD-10-CM | POA: Diagnosis not present

## 2023-05-20 MED ORDER — CLONIDINE HCL 0.2 MG PO TABS: 0.2000 mg | ORAL_TABLET | Freq: Every day | ORAL | Status: DC

## 2023-05-20 MED ORDER — MIRTAZAPINE 15 MG PO TABS
15.0000 mg | ORAL_TABLET | Freq: Every day | ORAL | Status: DC
  Administered 2023-05-20 – 2023-05-22 (×3): 15 mg via ORAL
  Filled 2023-05-20 (×3): qty 1

## 2023-05-20 MED ORDER — CLONIDINE HCL 0.1 MG PO TABS: 0.1000 mg | ORAL_TABLET | ORAL | Status: DC

## 2023-05-20 MED ORDER — CLONIDINE HCL 0.2 MG PO TABS
0.2000 mg | ORAL_TABLET | ORAL | Status: AC
  Administered 2023-05-21 – 2023-05-23 (×4): 0.2 mg via ORAL
  Filled 2023-05-20 (×4): qty 1

## 2023-05-20 MED ORDER — CLONIDINE HCL 0.2 MG PO TABS
0.2000 mg | ORAL_TABLET | Freq: Four times a day (QID) | ORAL | Status: AC
  Administered 2023-05-20 – 2023-05-21 (×2): 0.2 mg via ORAL
  Filled 2023-05-20 (×3): qty 1

## 2023-05-20 NOTE — ED Notes (Signed)
 Patient alert & oriented x4. Denies intent to harm self or others when asked. Denies A/VH. Patient reported generalized aches rating a 8/10 - PRN medication given, patient has had no further complaints No acute distress noted. Scheduled and PRN medications administered with no complications. Support and encouragement provided. Routine safety checks conducted per facility protocol. Encouraged patient to notify staff if any thoughts of harm towards self or others arise. Patient verbalizes understanding and agreement.

## 2023-05-20 NOTE — ED Notes (Signed)
 Patient resting with eyes closed in no apparent acute distress. Respirations even and unlabored. Environment secured. Safety checks in place according to facility policy.

## 2023-05-20 NOTE — Group Note (Signed)
 Group Topic: Positive Affirmations  Group Date: 05/20/2023 Start Time: 1630 End Time: 1700 Facilitators: Loyce Dys, NT  Department: [facility]  Number of Participants: 6  Group Focus: affirmation Treatment Modality:  Interpersonal Therapy Interventions utilized were reminiscence and support Purpose: express feelings and increase insight  Name: Cassidy Underwood [facility] Date of Birth: [birth year]  MR: [id number]    Level of Participation: when cued Quality of Participation: attentive, cooperative, and quiet Interactions with others: gave feedback Mood/Affect: appropriate Triggers (if applicable): n/a Cognition: coherent/clear Progress: Gaining insight Response: PT stated that it was a different world in here (as opposed to being homeless), stated that she was thankful Plan: patient will be encouraged to attend group  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   Polysubstance dependence (HCC) 05/18/2023   Recurrent major depressive disorder (HCC) 04/02/2023   Rhabdomyolysis 06/25/2021   HO Drug overdose, accidental or unintentional 06/25/2021   AKI (acute kidney injury) (HCC) 06/25/2021   Leukocytosis 06/25/2021   Elevated LFTs 06/25/2021   Hyperglycemia 06/25/2021   Hearing loss 06/25/2021   [redacted] weeks gestation of pregnancy 12/17/2018   Normal labor 12/16/2018   Substance induced mood disorder (HCC)    Cocaine use disorder, severe, dependence (HCC) 02/22/2018   Major depressive disorder, single episode, severe without psychotic features (HCC) 02/22/2018   Major depressive disorder, single episode, severe without psychosis (HCC) 02/22/2018    Indication for care in labor or delivery 11/13/2017   Pregnancy with adoption planned 10/05/2017   IUGR (intrauterine growth restriction) affecting care of mother 10/05/2017   Supervision of high risk pregnancy, antepartum 08/02/2017   Polysubstance abuse (HCC) 07/29/2017   Homelessness 11/02/2016

## 2023-05-20 NOTE — ED Provider Notes (Signed)
 Behavioral Health Progress Note  Date and Time: 05/20/2023 2:21 PM Name: Cassidy Underwood [facility] MRN:  [id number] CC: opioid detox  Subjective:  Cassidy Underwood [facility] is a 28 y.o. 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 This is not her first time at [facility].  Does have cravings for opioids, asked for suboxone.  Some improvement in withdrawal sxs, mainly tremors. She does find clonidine to be helpful. Her BP tolerated clonidine well, will increase to help with w/d sxs, she is aware we have to watch her BP.  She is interested in residential rehab. She is tired of being homeless and wants to get off the streets. Her biggest concern is that she has a feeling of generalized weakness and fatigue, that she feels is alleviated by using.   Mood:Anxious (mainly somatic sxs of anxiety, less cognitive sxs) Sleep:Poor (better, but still having difficulties with staying and falling asleep) Appetite: low, but improving SI:No HI:No FAO:ZHYQ Ideas of Reference:None   Review of Systems  Constitutional:  Positive for chills and malaise/fatigue.  HENT:  Positive for congestion.   Respiratory:  Negative for shortness of breath.   Gastrointestinal:  Positive for abdominal pain, diarrhea and nausea.  Musculoskeletal:  Positive for back pain and myalgias. Negative for falls.  Neurological:  Positive for weakness and headaches. Negative for dizziness and tremors.    Diagnosis:  Final diagnoses:  Sedative or hypnotic abuse (HCC)  Cocaine use disorder, severe, dependence (HCC)  Substance induced mood disorder (HCC)  Psychoactive substance-induced insomnia (HCC)  Homelessness   Methamphetamine dependence (HCC)  HO Drug overdose, accidental or unintentional  History of admission to inpatient psychiatry department  History of suicide attempt   Past Psychiatric History: See H&P Past Medical History: See H&P Family History: See H&P Family Psychiatric  History: See H&P Social History: See H&P  Total Time spent with patient: 30 minutes  Additional Social History:                         Current Medications:  Current Facility-Administered Medications  Medication Dose Route Frequency Provider Last Rate Last Admin   acetaminophen (TYLENOL) tablet 650 mg  650 mg Oral Q6H PRN Roselle Locus, MD   650 mg at 05/19/23 1645   alum & mag hydroxide-simeth (MAALOX/MYLANTA) 200-200-20 MG/5ML suspension 30 mL  30 mL Oral Q4H PRN Roselle Locus, MD       [START ON 05/25/2023] cloNIDine (CATAPRES) tablet 0.1 mg  0.1 mg Oral Mal Amabile, DO       cloNIDine (CATAPRES) tablet 0.2 mg  0.2 mg Oral QID Princess Bruins, DO       Followed by   Melene Muller ON 05/21/2023] cloNIDine (CATAPRES) tablet 0.2 mg  0.2 mg Oral BH-qamhs Princess Bruins, DO       Followed by   Melene Muller ON 05/24/2023] cloNIDine (CATAPRES) tablet 0.2 mg  0.2 mg Oral QAC breakfast Princess Bruins, DO       dicyclomine (BENTYL) tablet 20 mg  20 mg Oral Q6H PRN Roselle Locus, MD   20 mg at 05/20/23 1100   haloperidol lactate (HALDOL) injection 5 mg  5 mg Intramuscular TID PRN Haze Rushing  Leigh, MD   5 mg at 05/18/23 2009   And   diphenhydrAMINE (BENADRYL) injection 50 mg  50 mg Intramuscular TID PRN Roselle Locus, MD   50 mg at 05/18/23 2009   And   LORazepam (ATIVAN) injection 2 mg  2 mg Intramuscular TID PRN Roselle Locus, MD   2 mg at 05/18/23 2009   feeding supplement (BOOST / RESOURCE BREEZE) liquid 1 Container  1 Container Oral TID BM Princess Bruins, DO   1 Container at 05/20/23 1100   hydrOXYzine (ATARAX) tablet 25 mg  25 mg Oral TID PRN Roselle Locus, MD   25  mg at 05/20/23 4098   loperamide (IMODIUM) capsule 2-4 mg  2-4 mg Oral PRN Roselle Locus, MD       magnesium hydroxide (MILK OF MAGNESIA) suspension 30 mL  30 mL Oral Daily PRN Roselle Locus, MD       methocarbamol (ROBAXIN) tablet 500 mg  500 mg Oral Q8H PRN Princess Bruins, DO   500 mg at 05/19/23 2339   mirtazapine (REMERON) tablet 15 mg  15 mg Oral QHS Princess Bruins, DO       naproxen (NAPROSYN) tablet 500 mg  500 mg Oral BID PRN Roselle Locus, MD   500 mg at 05/19/23 2340   OLANZapine (ZYPREXA) tablet 10 mg  10 mg Oral QHS Hill, Shelbie Hutching, MD   10 mg at 05/19/23 2119   ondansetron (ZOFRAN-ODT) disintegrating tablet 4 mg  4 mg Oral Q6H PRN Roselle Locus, MD       No current outpatient medications on file.   Labs  Lab Results:  Admission on 05/17/2023, Discharged on 05/17/2023  Component Date Value Ref Range Status   WBC 05/17/2023 9.5  4.0 - 10.5 K/uL Final   RBC 05/17/2023 4.29  3.87 - 5.11 MIL/uL Final   Hemoglobin 05/17/2023 12.7  12.0 - 15.0 g/dL Final   HCT 11/91/4782 37.9  36.0 - 46.0 % Final   MCV 05/17/2023 88.3  80.0 - 100.0 fL Final   MCH 05/17/2023 29.6  26.0 - 34.0 pg Final   MCHC 05/17/2023 33.5  30.0 - 36.0 g/dL Final   RDW 95/62/1308 12.2  11.5 - 15.5 % Final   Platelets 05/17/2023 355  150 - 400 K/uL Final   nRBC 05/17/2023 0.0  0.0 - 0.2 % Final   Neutrophils Relative % 05/17/2023 49  % Final   Neutro Abs 05/17/2023 4.6  1.7 - 7.7 K/uL Final   Lymphocytes Relative 05/17/2023 41  % Final   Lymphs Abs 05/17/2023 3.9  0.7 - 4.0 K/uL Final   Monocytes Relative 05/17/2023 6  % Final   Monocytes Absolute 05/17/2023 0.6  0.1 - 1.0 K/uL Final   Eosinophils Relative 05/17/2023 4  % Final   Eosinophils Absolute 05/17/2023 0.3  0.0 - 0.5 K/uL Final   Basophils Relative 05/17/2023 0  % Final   Basophils Absolute 05/17/2023 0.0  0.0 - 0.1 K/uL Final   Immature Granulocytes 05/17/2023 0  % Final   Abs Immature Granulocytes 05/17/2023 0.04   0.00 - 0.07 K/uL Final   Performed at [facility], [address], [place], [place] [address]   Sodium 05/17/2023 141  135 - 145 mmol/L Final   Potassium 05/17/2023 4.5  3.5 - 5.1 mmol/L Final   Chloride 05/17/2023 106  98 - 111 mmol/L Final   CO2 05/17/2023 25  22 - 32 mmol/L Final   Glucose, Bld  05/17/2023 84  70 - 99 mg/dL Final   Glucose reference range applies only to samples taken after fasting for at least 8 hours.   BUN 05/17/2023 8  6 - 20 mg/dL Final   Creatinine, Ser 05/17/2023 0.77  0.44 - 1.00 mg/dL Final   Calcium 16/11/9602 9.0  8.9 - 10.3 mg/dL Final   Total Protein 54/10/8117 6.3 (L)  6.5 - 8.1 g/dL Final   Albumin 14/78/2956 3.5  3.5 - 5.0 g/dL Final   AST 21/30/8657 22  15 - 41 U/L Final   ALT 05/17/2023 16  0 - 44 U/L Final   Alkaline Phosphatase 05/17/2023 57  38 - 126 U/L Final   Total Bilirubin 05/17/2023 0.6  0.0 - 1.2 mg/dL Final   GFR, Estimated 05/17/2023 >60  >60 mL/min Final   Comment: (NOTE) Calculated using the CKD-EPI Creatinine Equation (2021)    Anion gap 05/17/2023 10  5 - 15 Final   Performed at [facility], [address], [place], [place] [address]   Hgb A1c MFr Bld 05/17/2023 5.2  4.8 - 5.6 % Final   Comment: (NOTE) Pre diabetes:          5.7%-6.4%  Diabetes:              >6.4%  Glycemic control for   <7.0% adults with diabetes    Mean Plasma Glucose 05/17/2023 102.54  mg/dL Final   Performed at [facility], [address] [place], [place] [address]   Alcohol, Ethyl (B) 05/17/2023 <10  <10 mg/dL Final   Comment: (NOTE) Lowest detectable limit for serum alcohol is 10 mg/dL.  For medical purposes only. Performed at [facility], [address], [place], [place] [address]    Cholesterol 05/17/2023 142  0 - 200 mg/dL Final   Triglycerides 40/11/2723 237 (H)  <150 mg/dL Final   HDL 36/64/4034 59  >40 mg/dL Final   Total CHOL/HDL Ratio 05/17/2023 2.4  RATIO Final   VLDL 05/17/2023 47 (H)  0 - 40 mg/dL Final    LDL Cholesterol 05/17/2023 36  0 - 99 mg/dL Final   Comment:        Total Cholesterol/HDL:CHD Risk Coronary Heart Disease Risk Table                     Men   Women  1/2 Average Risk   3.4   3.3  Average Risk       5.0   4.4  2 X Average Risk   9.6   7.1  3 X Average Risk  23.4   11.0        Use the calculated Patient Ratio above and the CHD Risk Table to determine the patient's CHD Risk.        ATP III CLASSIFICATION (LDL):  <100     mg/dL   Optimal  742-595  mg/dL   Near or Above                    Optimal  130-159  mg/dL   Borderline  638-756  mg/dL   High  >433     mg/dL   Very High Performed at [facility], [address] [place], [place] [address]    TSH 05/17/2023 0.589  0.350 - 4.500 uIU/mL Final   Comment: Performed by a 3rd Generation assay with a functional sensitivity of <=0.01 uIU/mL. Performed at [facility], [address] [place], [place] [address]    Color, Urine 05/17/2023 YELLOW  YELLOW Final   APPearance 05/17/2023 CLEAR  CLEAR Final   Specific Gravity, Urine 05/17/2023 1.025  1.005 - 1.030 Final   pH 05/17/2023 6.0  5.0 - 8.0 Final   Glucose, UA 05/17/2023 NEGATIVE  NEGATIVE mg/dL Final   Hgb urine dipstick 05/17/2023 NEGATIVE  NEGATIVE Final   Bilirubin Urine 05/17/2023 NEGATIVE  NEGATIVE Final   Ketones, ur 05/17/2023 NEGATIVE  NEGATIVE mg/dL Final   Protein, ur 62/13/0865 NEGATIVE  NEGATIVE mg/dL Final   Nitrite 78/46/9629 NEGATIVE  NEGATIVE Final   Leukocytes,Ua 05/17/2023 NEGATIVE  NEGATIVE Final   Comment: Microscopic not done on urines with negative protein, blood, leukocytes, nitrite, or glucose < 500 mg/dL. Performed at [facility], [address] [place], [place] [address]    Preg Test, Ur 05/17/2023 Negative  Negative Final   POC Amphetamine UR 05/17/2023 None Detected  NONE DETECTED (Cut Off Level 1000 ng/mL) Final   POC Secobarbital (BAR) 05/17/2023 None Detected  NONE DETECTED (Cut Off Level 300 ng/mL) Final   POC  Buprenorphine (BUP) 05/17/2023 None Detected  NONE DETECTED (Cut Off Level 10 ng/mL) Final   POC Oxazepam (BZO) 05/17/2023 None Detected  NONE DETECTED (Cut Off Level 300 ng/mL) Final   POC Cocaine UR 05/17/2023 Positive (A)  NONE DETECTED (Cut Off Level 300 ng/mL) Final   POC Methamphetamine UR 05/17/2023 None Detected  NONE DETECTED (Cut Off Level 1000 ng/mL) Final   POC Morphine 05/17/2023 Positive (A)  NONE DETECTED (Cut Off Level 300 ng/mL) Final   POC Methadone UR 05/17/2023 None Detected  NONE DETECTED (Cut Off Level 300 ng/mL) Final   POC Oxycodone UR 05/17/2023 None Detected  NONE DETECTED (Cut Off Level 100 ng/mL) Final   POC Marijuana UR 05/17/2023 None Detected  NONE DETECTED (Cut Off Level 50 ng/mL) Final  Admission on 03/29/2023, Discharged on 04/02/2023  Component Date Value Ref Range Status   SARS Coronavirus 2 by RT PCR 03/31/2023 NEGATIVE  NEGATIVE Final   Performed at [facility], [address], [place], [place] [address]   WBC 03/29/2023 10.1  4.0 - 10.5 K/uL Final   RBC 03/29/2023 4.62  3.87 - 5.11 MIL/uL Final   Hemoglobin 03/29/2023 13.2  12.0 - 15.0 g/dL Final   HCT 12/10/2534 39.5  36.0 - 46.0 % Final   MCV 03/29/2023 85.5  80.0 - 100.0 fL Final   MCH 03/29/2023 28.6  26.0 - 34.0 pg Final   MCHC 03/29/2023 33.4  30.0 - 36.0 g/dL Final   RDW 64/40/3474 11.7  11.5 - 15.5 % Final   Platelets 03/29/2023 407 (H)  150 - 400 K/uL Final   nRBC 03/29/2023 0.0  0.0 - 0.2 % Final   Neutrophils Relative % 03/29/2023 53  % Final   Neutro Abs 03/29/2023 5.2  1.7 - 7.7 K/uL Final   Lymphocytes Relative 03/29/2023 41  % Final   Lymphs Abs 03/29/2023 4.1 (H)  0.7 - 4.0 K/uL Final   Monocytes Relative 03/29/2023 5  % Final   Monocytes Absolute 03/29/2023 0.5  0.1 - 1.0 K/uL Final   Eosinophils Relative 03/29/2023 1  % Final   Eosinophils Absolute 03/29/2023 0.1  0.0 - 0.5 K/uL Final   Basophils Relative 03/29/2023 0  % Final   Basophils Absolute 03/29/2023 0.0  0.0 - 0.1  K/uL Final   Immature Granulocytes 03/29/2023 0  % Final   Abs Immature Granulocytes 03/29/2023  0.04  0.00 - 0.07 K/uL Final   Performed at [facility], [address] [place], [place] [address]   Sodium 03/29/2023 140  135 - 145 mmol/L Final   Potassium 03/29/2023 3.7  3.5 - 5.1 mmol/L Final   Chloride 03/29/2023 105  98 - 111 mmol/L Final   CO2 03/29/2023 25  22 - 32 mmol/L Final   Glucose, Bld 03/29/2023 86  70 - 99 mg/dL Final   Glucose reference range applies only to samples taken after fasting for at least 8 hours.   BUN 03/29/2023 13  6 - 20 mg/dL Final   Creatinine, Ser 03/29/2023 0.75  0.44 - 1.00 mg/dL Final   Calcium 29/56/2130 9.0  8.9 - 10.3 mg/dL Final   Total Protein 86/57/8469 6.7  6.5 - 8.1 g/dL Final   Albumin 62/95/2841 3.8  3.5 - 5.0 g/dL Final   AST 32/44/0102 31  15 - 41 U/L Final   ALT 03/29/2023 28  0 - 44 U/L Final   Alkaline Phosphatase 03/29/2023 61  38 - 126 U/L Final   Total Bilirubin 03/29/2023 0.6  0.0 - 1.2 mg/dL Final   GFR, Estimated 03/29/2023 >60  >60 mL/min Final   Comment: (NOTE) Calculated using the CKD-EPI Creatinine Equation (2021)    Anion gap 03/29/2023 10  5 - 15 Final   Performed at [facility], [address], [place], [place] [address]   Alcohol, Ethyl (B) 03/29/2023 <10  <10 mg/dL Final   Comment: (NOTE) Lowest detectable limit for serum alcohol is 10 mg/dL.  For medical purposes only. Performed at [facility], [address] [place], [place] [address]    TSH 03/29/2023 0.378  0.350 - 4.500 uIU/mL Final   Comment: Performed by a 3rd Generation assay with a functional sensitivity of <=0.01 uIU/mL. Performed at [facility], [address] [place], [place] [address]    Preg Test, Ur 03/29/2023 Negative  Negative Final   POC Amphetamine UR 03/29/2023 None Detected  NONE DETECTED (Cut Off Level 1000 ng/mL) Final   POC Secobarbital (BAR) 03/29/2023 None Detected  NONE DETECTED (Cut Off Level 300 ng/mL)  Final   POC Buprenorphine (BUP) 03/29/2023 Positive (A)  NONE DETECTED (Cut Off Level 10 ng/mL) Final   POC Oxazepam (BZO) 03/29/2023 Positive (A)  NONE DETECTED (Cut Off Level 300 ng/mL) Final   POC Cocaine UR 03/29/2023 Positive (A)  NONE DETECTED (Cut Off Level 300 ng/mL) Final   POC Methamphetamine UR 03/29/2023 None Detected  NONE DETECTED (Cut Off Level 1000 ng/mL) Final   POC Morphine 03/29/2023 None Detected  NONE DETECTED (Cut Off Level 300 ng/mL) Final   POC Methadone UR 03/29/2023 None Detected  NONE DETECTED (Cut Off Level 300 ng/mL) Final   POC Oxycodone UR 03/29/2023 None Detected  NONE DETECTED (Cut Off Level 100 ng/mL) Final   POC Marijuana UR 03/29/2023 None Detected  NONE DETECTED (Cut Off Level 50 ng/mL) Final  Admission on 03/28/2023, Discharged on 03/28/2023  Component Date Value Ref Range Status   Total CK 03/28/2023 361 (H)  38 - 234 U/L Final   Performed at [facility], [address], [place], [place] [address]   WBC 03/28/2023 11.2 (H)  4.0 - 10.5 K/uL Final   RBC 03/28/2023 4.49  3.87 - 5.11 MIL/uL Final   Hemoglobin 03/28/2023 13.2  12.0 - 15.0 g/dL Final   HCT 75/64/3329 38.2  36.0 - 46.0 % Final   MCV  03/28/2023 85.1  80.0 - 100.0 fL Final   MCH 03/28/2023 29.4  26.0 - 34.0 pg Final   MCHC 03/28/2023 34.6  30.0 - 36.0 g/dL Final   RDW 16/11/9602 11.6  11.5 - 15.5 % Final   Platelets 03/28/2023 448 (H)  150 - 400 K/uL Final   nRBC 03/28/2023 0.0  0.0 - 0.2 % Final   Neutrophils Relative % 03/28/2023 37  % Final   Neutro Abs 03/28/2023 4.2  1.7 - 7.7 K/uL Final   Lymphocytes Relative 03/28/2023 54  % Final   Lymphs Abs 03/28/2023 6.0 (H)  0.7 - 4.0 K/uL Final   Monocytes Relative 03/28/2023 6  % Final   Monocytes Absolute 03/28/2023 0.7  0.1 - 1.0 K/uL Final   Eosinophils Relative 03/28/2023 3  % Final   Eosinophils Absolute 03/28/2023 0.4  0.0 - 0.5 K/uL Final   Basophils Relative 03/28/2023 0  % Final   Basophils Absolute 03/28/2023  0.1  0.0 - 0.1 K/uL Final   Immature Granulocytes 03/28/2023 0  % Final   Abs Immature Granulocytes 03/28/2023 0.05  0.00 - 0.07 K/uL Final   Performed at [facility], [address], [place], [place] [address]   Sodium 03/28/2023 134 (L)  135 - 145 mmol/L Final   Potassium 03/28/2023 3.8  3.5 - 5.1 mmol/L Final   Chloride 03/28/2023 98  98 - 111 mmol/L Final   CO2 03/28/2023 26  22 - 32 mmol/L Final   Glucose, Bld 03/28/2023 111 (H)  70 - 99 mg/dL Final   Glucose reference range applies only to samples taken after fasting for at least 8 hours.   BUN 03/28/2023 22 (H)  6 - 20 mg/dL Final   Creatinine, Ser 03/28/2023 0.89  0.44 - 1.00 mg/dL Final   Calcium 11/91/4782 8.7 (L)  8.9 - 10.3 mg/dL Final   GFR, Estimated 03/28/2023 >60  >60 mL/min Final   Comment: (NOTE) Calculated using the CKD-EPI Creatinine Equation (2021)    Anion gap 03/28/2023 10  5 - 15 Final   Performed at [facility], [address], [place], [place] [address]   SARS Coronavirus 2 by RT PCR 03/28/2023 NEGATIVE  NEGATIVE Final   Comment: (NOTE) SARS-CoV-2 target nucleic acids are NOT DETECTED.  The SARS-CoV-2 RNA is generally detectable in upper respiratory specimens during the acute phase of infection. The lowest concentration of SARS-CoV-2 viral copies this assay can detect is 138 copies/mL. A negative result does not preclude SARS-Cov-2 infection and should not be used as the sole basis for treatment or other patient management decisions. A negative result may occur with  improper specimen collection/handling, submission of specimen other than nasopharyngeal swab, presence of viral mutation(s) within the areas targeted by this assay, and inadequate number of viral copies(<138 copies/mL). A negative result must be combined with clinical observations, patient history, and epidemiological information. The expected result is Negative.  Fact Sheet for Patients:   BloggerCourse.com  Fact Sheet for Healthcare Providers:  SeriousBroker.it  This test is no                          t yet approved or cleared by the [place] FDA and  has been authorized for detection and/or diagnosis of SARS-CoV-2 by FDA under an Emergency Use Authorization (EUA). This EUA will remain  in effect (meaning this test can be used) for the duration of the COVID-19 declaration under Section 564(b)(1) of  the Act, 21 U.S.C.section 360bbb-3(b)(1), unless the authorization is terminated  or revoked sooner.       Influenza A by PCR 03/28/2023 NEGATIVE  NEGATIVE Final   Influenza B by PCR 03/28/2023 NEGATIVE  NEGATIVE Final   Comment: (NOTE) The Xpert Xpress SARS-CoV-2/FLU/RSV plus assay is intended as an aid in the diagnosis of influenza from Nasopharyngeal swab specimens and should not be used as a sole basis for treatment. Nasal washings and aspirates are unacceptable for Xpert Xpress SARS-CoV-2/FLU/RSV testing.  Fact Sheet for Patients: BloggerCourse.com  Fact Sheet for Healthcare Providers: SeriousBroker.it  This test is not yet approved or cleared by the [place] FDA and has been authorized for detection and/or diagnosis of SARS-CoV-2 by FDA under an Emergency Use Authorization (EUA). This EUA will remain in effect (meaning this test can be used) for the duration of the COVID-19 declaration under Section 564(b)(1) of the Act, 21 U.S.C. section 360bbb-3(b)(1), unless the authorization is terminated or revoked.     Resp Syncytial Virus by PCR 03/28/2023 NEGATIVE  NEGATIVE Final   Comment: (NOTE) Fact Sheet for Patients: BloggerCourse.com  Fact Sheet for Healthcare Providers: SeriousBroker.it  This test is not yet approved or cleared by the [place] FDA and has been authorized for detection and/or  diagnosis of SARS-CoV-2 by FDA under an Emergency Use Authorization (EUA). This EUA will remain in effect (meaning this test can be used) for the duration of the COVID-19 declaration under Section 564(b)(1) of the Act, 21 U.S.C. section 360bbb-3(b)(1), unless the authorization is terminated or revoked.  Performed at [facility], [address], [place], [place] [address]    Specimen Source 03/28/2023 URINE, CLEAN CATCH   Final   Color, Urine 03/28/2023 YELLOW  YELLOW Final   APPearance 03/28/2023 CLOUDY (A)  CLEAR Final   Specific Gravity, Urine 03/28/2023 1.024  1.005 - 1.030 Final   pH 03/28/2023 8.0  5.0 - 8.0 Final   Glucose, UA 03/28/2023 NEGATIVE  NEGATIVE mg/dL Final   Hgb urine dipstick 03/28/2023 NEGATIVE  NEGATIVE Final   Bilirubin Urine 03/28/2023 NEGATIVE  NEGATIVE Final   Ketones, ur 03/28/2023 NEGATIVE  NEGATIVE mg/dL Final   Protein, ur 60/45/4098 100 (A)  NEGATIVE mg/dL Final   Nitrite 11/91/4782 POSITIVE (A)  NEGATIVE Final   Leukocytes,Ua 03/28/2023 MODERATE (A)  NEGATIVE Final   RBC / HPF 03/28/2023 0-5  0 - 5 RBC/hpf Final   WBC, UA 03/28/2023 >50  0 - 5 WBC/hpf Final   Comment:        Reflex urine culture not performed if WBC <=10, OR if Squamous epithelial cells >5. If Squamous epithelial cells >5 suggest recollection.    Bacteria, UA 03/28/2023 MANY (A)  NONE SEEN Final   Squamous Epithelial / HPF 03/28/2023 0-5  0 - 5 /HPF Final   Mucus 03/28/2023 PRESENT   Final   Hyaline Casts, UA 03/28/2023 PRESENT   Final   Triple Phosphate Crystal 03/28/2023 PRESENT   Final   Performed at [facility], [address] [place], [place] [address]   Opiates 03/28/2023 NONE DETECTED  NONE DETECTED Final   Cocaine 03/28/2023 POSITIVE (A)  NONE DETECTED Final   Benzodiazepines 03/28/2023 POSITIVE (A)  NONE DETECTED Final   Amphetamines 03/28/2023 POSITIVE (A)  NONE DETECTED Final   Tetrahydrocannabinol 03/28/2023 NONE DETECTED  NONE  DETECTED Final   Barbiturates 03/28/2023 NONE DETECTED  NONE DETECTED Final   Comment: (NOTE) DRUG SCREEN FOR MEDICAL PURPOSES ONLY.  IF CONFIRMATION IS  NEEDED FOR ANY PURPOSE, NOTIFY LAB WITHIN 5 DAYS.  LOWEST DETECTABLE LIMITS FOR URINE DRUG SCREEN Drug Class                     Cutoff (ng/mL) Amphetamine and metabolites    1000 Barbiturate and metabolites    200 Benzodiazepine                 200 Opiates and metabolites        300 Cocaine and metabolites        300 THC                            50 Performed at [facility], [address] [place], [place] [address]    Preg Test, Ur 03/28/2023 NEGATIVE  NEGATIVE Final   Comment:        THE SENSITIVITY OF THIS METHODOLOGY IS >25 mIU/mL. Performed at [facility], [address] [place], [place] [address]    Specimen Description 03/28/2023    Final                   Value:URINE, RANDOM Performed at [facility], [address], [place], [place] [address]    Special Requests 03/28/2023    Final                   Value:NONE Reflexed from [id number] Performed at [facility], [address], [place], [place] [address]    Culture 03/28/2023 >=100,000 COLONIES/mL PROTEUS MIRABILIS (A)   Final   Report Status 03/28/2023 03/30/2023 FINAL   Final   Organism ID, Bacteria 03/28/2023 PROTEUS MIRABILIS (A)   Final   Specimen Description 03/28/2023    Final                   Value:BLOOD RIGHT ANTECUBITAL Performed at [facility], [address], [place], [place] [address]    Special Requests 03/28/2023    Final                   Value:BOTTLES DRAWN AEROBIC AND ANAEROBIC Blood Culture adequate volume Performed at [facility], [address] [place], [place] [address]    Culture 03/28/2023    Final                   Value:NO GROWTH 5 DAYS Performed at [facility], [address], [place], [place] [address]     Report Status 03/28/2023 04/02/2023 FINAL   Final   Specimen Description 03/28/2023    Final                   Value:BLOOD LEFT ARM Performed at [facility], [address] [place], [place] [address]    Special Requests 03/28/2023    Final                   Value:BOTTLES DRAWN AEROBIC AND ANAEROBIC Blood Culture results may not be optimal due to an inadequate volume of blood received in culture bottles Performed at [facility], [address] [place], [place] [address]    Culture 03/28/2023    Final                   Value:NO GROWTH 5 DAYS Performed at [facility], [address] [place],  [place] [address]    Report Status 03/28/2023 04/02/2023 FINAL   Final   Lactic Acid, Venous 03/28/2023 2.8 (HH)  0.5 - 1.9 mmol/L Final   Comment 03/28/2023 NOTIFIED PHYSICIAN   Final   Troponin I (High Sensitivity) 03/28/2023 <2  <18 ng/L Final   Comment: (NOTE) Elevated high sensitivity troponin I (hsTnI) values and significant  changes across serial measurements may suggest ACS but many other  chronic and acute conditions are known to elevate hsTnI results.  Refer to the "Links" section for chest pain algorithms and additional  guidance. Performed at [facility], [address], [place], [place] [address]    Lactic Acid, Venous 03/28/2023 1.0  0.5 - 1.9 mmol/L Final    Blood Alcohol level:  Lab Results  Component Value Date   ETH <10 05/17/2023   ETH <10 03/29/2023    Metabolic Disorder Labs: Lab Results  Component Value Date   HGBA1C 5.2 05/17/2023   MPG 102.54 05/17/2023   MPG 108.28 06/25/2021   No results found for: "PROLACTIN" Lab Results  Component Value Date   CHOL 142 05/17/2023   TRIG 237 (H) 05/17/2023   HDL 59 05/17/2023   CHOLHDL 2.4 05/17/2023   VLDL 47 (H) 05/17/2023   LDLCALC 36 05/17/2023    Therapeutic Lab Levels: No results found for: "LITHIUM" No results found for: "VALPROATE" No results found  for: "CBMZ"  Physical Findings   AIMS    Flowsheet Row Admission (Discharged) from 02/22/2018 in [facility]PAT[facility]INE  AIMS Total Score 0      AUDIT    Flowsheet Row Admission (Discharged) from 02/22/2018 in [facility] INPAT[facility] BE[facility]AV[facility]AL MEDICINE  Alcohol Use Disorder Identification Test Final Score (AUDIT) 8      PHQ2-9    Flowsheet Row ED from 03/29/2023 in [facility]  PHQ-2 Total Score 1  PHQ-9 Total Score 4      Flowsheet Row ED from 05/18/2023 in [facility] Emergency Department at [facility] Most recent reading at 05/18/2023  8:07 AM ED from 05/17/2023 in [facility] Most recent reading at 05/17/2023 11:29 PM ED from 05/17/2023 in [facility] Most recent reading at 05/17/2023  3:38 PM  C-SSRS RISK CATEGORY No Risk No Risk Error: Q3, 4, or 5 should not be populated when Q2 is No        Musculoskeletal  Strength & Muscle Tone: within normal limited Gait & Station: normal Patient leans: NA  Psychiatric Specialty Exam  Presentation General Appearance:Disheveled (in bed, under blanket, more engaged and pleasant today) Eye Contact:Fair Speech:Clear and Coherent, Normal Rate (spontaneous) Volume:Decreased Handedness:Right  Mood and Affect  Mood:Anxious (mainly somatic sxs of anxiety, less cognitive sxs) Affect:Appropriate, Congruent, Full Range  Thought Process  Thought Process:Coherent, Goal Directed, Linear Descriptions of Associations:Intact  Thought Content Suicidal Thoughts:No Homicidal Thoughts:No Hallucinations:None Ideas of Reference:None Thought Content:WDL  Sensorium  Memory:Immediate Fair Judgment:Fair Insight:Shallow  Executive Functions  Orientation:Full (Time, Place and Person) Language:Good Concentration:Good Attention:Good Recall:Good Fund of Knowledge:Good  Psychomotor Activity  Psychomotor Activity:Psychomotor Activity:  Normal  Assets  Assets:Communication Skills, Desire for Improvement, Resilience  Sleep  Quality:Poor (better, but still having difficulties with staying and falling asleep) Physical Exam  Physical Exam Vitals and nursing note reviewed.  Constitutional:      General: She is not in acute distress.    Appearance: She is ill-appearing. She is not toxic-appearing or diaphoretic.  HENT:     Head: Normocephalic  and atraumatic.  Eyes:     Conjunctiva/sclera: Conjunctivae normal.  Pulmonary:     Effort: Pulmonary effort is normal. No respiratory distress.  Neurological:     General: No focal deficit present.     Mental Status: She is alert and oriented to person, place, and time.     Gait: Gait normal.    Blood pressure 112/66, pulse 70, temperature 97.9 F (36.6 C), temperature source Oral, resp. rate 20, SpO2 100%. There is no height or weight on file to calculate BMI.  Treatment Plan Summary: Daily contact with patient to assess and evaluate symptoms and progress in treatment and Medication management Principal Problem:   Polysubstance abuse (HCC) Active Problems:   HO Drug overdose, accidental or unintentional   Homelessness   Cocaine use disorder, severe, dependence (HCC)   Major depressive disorder, single episode, severe without psychotic features (HCC)   Methamphetamine dependence (HCC)   Sedative or hypnotic abuse (HCC)   Cocaine abuse (HCC)   Psychoactive substance-induced insomnia (HCC)   History of admission to inpatient psychiatry department   History of suicide attempt  Still suffering through withdrawals and cravings, although she looks better than yesterday. BP is normotensive, will increase it per below.  Has issues with anxiety and appetite, despite being on zyprexa 10 mg, starting her on remeron per below.  OUD, severe Denied ever IVDU COWS with clonidine and comfort PRNs per protocol Continued clonidine taper (ends 4/11)  Stimulant use d/o Cannabis use  d/o Denied ever IVDU Encouraged cessation Comfort PRNs   Substance-induced mood d/o  Substance-induced insomnia Continued home zyprexa 10 mg qPM STARTED remeron 15 mg qPM (new med)  PRNs: acetaminophen, 650 mg, Q6H PRN alum & mag hydroxide-simeth, 30 mL, Q4H PRN dicyclomine, 20 mg, Q6H PRN haloperidol lactate, 5 mg, TID PRN  And diphenhydrAMINE, 50 mg, TID PRN  And LORazepam, 2 mg, TID PRN hydrOXYzine, 25 mg, TID PRN loperamide, 2-4 mg, PRN magnesium hydroxide, 30 mL, Daily PRN methocarbamol, 500 mg, Q8H PRN naproxen, 500 mg, BID PRN ondansetron, 4 mg, Q6H PRN       Abnormal Labs Reviewed - No data to display   DISPO:  Tentative date: 23, ~4/11 Location: interested in rehab Follow-up/Recs: PCP - health maintenance    Princess Bruins, DO Psych Resident, PGY-3 05/20/2023 2:21 PM

## 2023-05-20 NOTE — ED Notes (Signed)
 Patient sitting in courtyard interacting with peers and participating in group. No acute distress noted. No concerns voiced. Informed patient to notify staff with any needs or assistance. Patient verbalized understanding or agreement. Safety checks in place per facility policy.

## 2023-05-20 NOTE — ED Notes (Signed)
 Patient is in the bedroom calm and sleeping. NAD. Respirations are even and unlabored. Will continue to monitor for safety.

## 2023-05-20 NOTE — ED Notes (Signed)
 Patient observed/assessed at bedside lying in bed asleep. Patient alert and oriented to self and location. Pt reports he is weak and just wants to stay in the bed . Patient endorses anxiety. He denies A/V/H. She denies having any thoughts/plan of self harm and harm towards others. Fluid and snack offered. Patient states that appetite has not been good throughout the day because she is still going through withdrawals. Verbalizes no further complaints at this time. Will continue to monitor and support.

## 2023-05-20 NOTE — ED Notes (Signed)
 Patient sitting in bedroom calm and composed. No acute distress noted. No concerns voiced. Informed patient to notify staff with any needs or assistance. Patient verbalized understanding or agreement. Safety checks in place per facility policy.

## 2023-05-20 NOTE — ED Notes (Signed)
 Patient observed/assessed in room in bed appearing in no immediate distress resting peacefully. Q15 minute checks continued by MHT and nursing staff. Will continue to monitor and support.

## 2023-05-21 DIAGNOSIS — F1423 Cocaine dependence with withdrawal: Secondary | ICD-10-CM | POA: Diagnosis not present

## 2023-05-21 DIAGNOSIS — F1994 Other psychoactive substance use, unspecified with psychoactive substance-induced mood disorder: Secondary | ICD-10-CM | POA: Diagnosis not present

## 2023-05-21 DIAGNOSIS — F1520 Other stimulant dependence, uncomplicated: Secondary | ICD-10-CM | POA: Diagnosis not present

## 2023-05-21 DIAGNOSIS — F19982 Other psychoactive substance use, unspecified with psychoactive substance-induced sleep disorder: Secondary | ICD-10-CM | POA: Diagnosis not present

## 2023-05-21 MED ORDER — TRAZODONE HCL 50 MG PO TABS
50.0000 mg | ORAL_TABLET | Freq: Every day | ORAL | Status: DC
Start: 2023-05-21 — End: 2023-05-23
  Administered 2023-05-21 – 2023-05-22 (×2): 50 mg via ORAL
  Filled 2023-05-21 (×2): qty 1

## 2023-05-21 NOTE — ED Provider Notes (Signed)
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 05/17/2023   MPG 102.54 05/17/2023   MPG 108.28 06/25/2021   No results  found for: "PROLACTIN" Lab Results  Component Value Date   CHOL 142 05/17/2023   TRIG 237 (H) 05/17/2023   HDL 59 05/17/2023   CHOLHDL 2.4 05/17/2023   VLDL 47 (H) 05/17/2023   LDLCALC 36 05/17/2023    Therapeutic Lab Levels: No results found for: "LITHIUM" No results found for: "VALPROATE" No results found for: "CBMZ"  Physical Findings   AIMS    Flowsheet Row Admission (Discharged) from 02/22/2018 in [facility] INPATIENT BEHAVIORAL MEDICINE  AIMS Total Score 0      AUDIT    Flowsheet Row Admission (Discharged) from 02/22/2018 in [facility]PAT[facility] BEH[facility]AL MEDICINE  Alcohol Use Disorder Identification Test Final Score (AUDIT) 8      PHQ2-9    Flowsheet Row ED from 03/29/2023 in [facility]  PHQ-2 Total Score 1  PHQ-9 Total Score 4      Flowsheet Row ED from 05/18/2023 in [facility] Emergency Department at [facility] Most recent reading at 05/18/2023  8:07 AM ED from 05/17/2023 in [facility] Most recent reading at 05/17/2023 11:29 PM ED from 05/17/2023 in [facility] Most recent reading at 05/17/2023  3:38 PM  C-SSRS RISK CATEGORY No Risk No Risk Error: Q3, 4, or 5 should not be populated when Q2 is No        Musculoskeletal  Strength & Muscle Tone: within normal limited Gait & Station: normal Patient leans: NA  Psychiatric Specialty Exam  Presentation General Appearance:Disheveled (in bed, under blanket, more engaged and pleasant today) Eye Contact:Fair Speech:Clear and Coherent, Normal Rate (spontaneous) Volume:Decreased Handedness:Right  Mood and Affect  Mood:Anxious (mainly somatic sxs of anxiety, less cognitive sxs) Affect: Anxious and mood congruent  Thought Process  Thought Process:Coherent, Goal Directed, Linear Descriptions of Associations:Intact  Thought Content Suicidal Thoughts:No Homicidal Thoughts:No Hallucinations:None Ideas of Reference:None Thought  Content:WDL  Sensorium  Memory:Immediate Fair Judgment: Poor Insight:Shallow  Executive Functions  Orientation:Full (Time, Place and Person) Language:Good Concentration:Good Attention:Good Recall:Good Fund of Knowledge:Good  Psychomotor Activity  Psychomotor Activity: Decreased Assets  Assets:Communication Skills, Desire for Improvement, Resilience  Sleep  Quality:Poor (better, but still having difficulties with staying and falling asleep) Physical Exam  Physical Exam Vitals  and nursing note reviewed.  Constitutional:      General: She is not in acute distress.    Appearance: She is ill-appearing. She is not toxic-appearing or diaphoretic.  HENT:     Head: Normocephalic and atraumatic.  Eyes:     Conjunctiva/sclera: Conjunctivae normal.  Pulmonary:     Effort: Pulmonary effort is normal. No respiratory distress.  Neurological:     General: No focal deficit present.     Mental Status: She is alert and oriented to person, place, and time.     Gait: Gait normal.    Blood pressure 109/67, pulse 84, temperature 98.9 F (37.2 C), temperature source Oral, resp. rate 18, SpO2 97%. There is no height or weight on file to calculate BMI.  Treatment Plan Summary: Daily contact with patient to assess and evaluate symptoms and progress in treatment and Medication management Principal Problem:   Polysubstance abuse (HCC) Active Problems:   Homelessness   Cocaine use disorder, severe, dependence (HCC)   Major depressive disorder, single episode, severe without psychotic features (HCC)   HO Drug overdose, accidental or unintentional   Methamphetamine dependence (HCC)   Sedative or hypnotic abuse (HCC)   Cocaine abuse (HCC)   Psychoactive substance-induced insomnia (HCC)   History of admission to inpatient psychiatry department   History of suicide attempt  Still suffering through withdrawals and cravings, although she looks better than yesterday. BP is normotensive, will  increase it per below.  Has issues with anxiety and appetite, despite being on zyprexa 10 mg, starting her on remeron per below.  OUD, severe Denied ever IVDU COWS with clonidine and comfort PRNs per protocol Continued clonidine taper (ends 4/11)  Stimulant use d/o Cannabis use d/o Denied ever IVDU Encouraged cessation Comfort PRNs   Substance-induced mood d/o  Substance-induced insomnia Continued home zyprexa 10 mg qPM Continue on Remeron Will add trazodone 50 mg at bedtime for insomnia  PRNs: acetaminophen, 650 mg, Q6H PRN alum & mag hydroxide-simeth, 30 mL, Q4H PRN dicyclomine, 20 mg, Q6H PRN haloperidol lactate, 5 mg, TID PRN  And diphenhydrAMINE, 50 mg, TID PRN  And LORazepam, 2 mg, TID PRN hydrOXYzine, 25 mg, TID PRN loperamide, 2-4 mg, PRN magnesium hydroxide, 30 mL, Daily PRN methocarbamol, 500 mg, Q8H PRN naproxen, 500 mg, BID PRN ondansetron, 4 mg, Q6H PRN       Abnormal Labs Reviewed - No data to display   DISPO:  Tentative date: 5, ~4/11 Location: interested in rehab Follow-up/Recs: PCP - health maintenance    Lewanda Rife, MD

## 2023-05-21 NOTE — ED Notes (Signed)
 Provider made aware of pt's low BP reading 92/57. Pt denies distress. Will recheck on R arm. No new orders given. Pt encouraged to drink fluids. Snack offered. Will continue to  monitor.

## 2023-05-21 NOTE — Group Note (Signed)
 Group Topic: Trust and Honesty  Group Date: 05/20/2023 Start Time: 1930 End Time: 2000 Facilitators: Hilma Favors, RN  Department: [facility]  Number of Participants: 3  Group Focus: acceptance Treatment Modality:  Behavior Modification Therapy Interventions utilized were confrontation Purpose: express feelings  Name: Cassidy Underwood [facility] Date of Birth: October 25, 1995  MR: [id number]    Level of Participation: minimal Quality of Participation: cooperative Interactions with others: gave feedback Mood/Affect: anxious Triggers (if applicable): n/a Cognition: coherent/clear Progress: Minimal Response: good Plan: referral / recommendations  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   Polysubstance dependence (HCC) 05/18/2023   Recurrent major depressive disorder (HCC) 04/02/2023   Rhabdomyolysis 06/25/2021   HO Drug overdose, accidental or unintentional 06/25/2021   AKI (acute kidney injury) (HCC) 06/25/2021   Leukocytosis 06/25/2021   Elevated LFTs 06/25/2021   Hyperglycemia 06/25/2021   Hearing loss 06/25/2021   [redacted] weeks gestation of pregnancy 12/17/2018   Normal labor 12/16/2018   Substance induced mood disorder (HCC)    Cocaine use disorder, severe, dependence (HCC) 02/22/2018   Major depressive disorder, single episode, severe without psychotic features (HCC) 02/22/2018   Major depressive disorder, single episode, severe without psychosis (HCC) 02/22/2018   Indication for care in labor or delivery 11/13/2017   Pregnancy with adoption planned 10/05/2017   IUGR (intrauterine growth restriction) affecting care of mother 10/05/2017    Supervision of high risk pregnancy, antepartum 08/02/2017   Polysubstance abuse (HCC) 07/29/2017   Homelessness 11/02/2016

## 2023-05-21 NOTE — Group Note (Signed)
 Group Topic: Communication  Group Date: 05/21/2023 Start Time: 0900 End Time: 1030 Facilitators: Prentice Docker, RN  Department: [facility]  Number of Participants: 9  Group Focus: communication Treatment Modality:  Individual Therapy Interventions utilized were patient education Purpose: increase insight  Name: Cassidy Underwood [facility] Date of Birth: [birth year]  MR: [id number]    Level of Participation: active Quality of Participation: cooperative Interactions with others: gave feedback Mood/Affect: appropriate Triggers (if applicable): none identified Cognition: coherent/clear Progress: Gaining insight Response: Pt verbalized understanding of all medication given Plan: patient will be encouraged to notify staff with any side effects of medication given  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   Polysubstance dependence (HCC) 05/18/2023   Recurrent major depressive disorder (HCC) 04/02/2023   Rhabdomyolysis 06/25/2021   HO Drug overdose, accidental or unintentional 06/25/2021   AKI (acute kidney injury) (HCC) 06/25/2021   Leukocytosis 06/25/2021   Elevated LFTs 06/25/2021   Hyperglycemia 06/25/2021   Hearing loss 06/25/2021   [redacted] weeks gestation of pregnancy 12/17/2018   Normal labor 12/16/2018   Substance induced mood disorder (HCC)    Cocaine use disorder, severe, dependence (HCC) 02/22/2018   Major depressive disorder, single episode, severe without psychotic features (HCC) 02/22/2018   Major depressive disorder, single episode, severe without psychosis (HCC) 02/22/2018   Indication for care in labor or delivery 11/13/2017   Pregnancy  with adoption planned 10/05/2017   IUGR (intrauterine growth restriction) affecting care of mother 10/05/2017   Supervision of high risk pregnancy, antepartum 08/02/2017   Polysubstance abuse (HCC) 07/29/2017   Homelessness 11/02/2016

## 2023-05-21 NOTE — ED Notes (Signed)
 Pt sleeping in no acute distress. RR even and unlabored. Environment secured. Will continue to monitor for safety.

## 2023-05-21 NOTE — Discharge Planning (Signed)
 Patient has been referred to [facility] and [facility] for review.   Fernande Boyden, LCSW Clinical Social Worker [place] [facility] Ph: [phone]

## 2023-05-21 NOTE — ED Notes (Signed)
 Patient is sleeping. Respirations equal and unlabored, skin warm and dry. No change in assessment or acuity. Routine safety checks conducted according to facility protocol. Will continue to monitor for safety.

## 2023-05-21 NOTE — ED Notes (Signed)
 Pt approached nurses station requesting to discharge. Pt states, "I'm just ready to go please". Informed pt of discharge process. Pt signed 72hour request to discharge form. Pt states, "Can you make sure the doctor see it today so I can leave". Reassured pt that a provider would see form and discuss appropriate plan of action with pt. Pt verbalized understanding. Denies intent to harm self/others when asked. Denies A/VH. Patient denies any physical complaints when asked. No acute distress noted. Support and encouragement provided. Routine safety checks conducted according to facility protocol. Encouraged patient to notify staff if thoughts of harm toward self or others arise. Patient verbalize understanding and agreement. Will continue to monitor for safety.

## 2023-05-21 NOTE — ED Notes (Signed)
 Pt  is in the hallway near the medication window eating Dinner. Requested to sit there instead of the dayroom because she feels pretty anxious sitting with people. RN agreed for her to sit there and eat.  Denies SI/HI & AVH. NAD. Respirations are even and unlabored.  Will monitor for safety.

## 2023-05-21 NOTE — ED Notes (Signed)
 Pt sitting in dayroom watching television and eating snack. No acute distress noted. No concerns voiced. Informed pt to notify staff with any needs or assistance. Pt verbalized understanding and agreement. Will continue to monitor for safety.

## 2023-05-21 NOTE — Discharge Instructions (Signed)
 [facility] [address], [place], [address] [phone] phone  New Patient Assessment/Therapy Walk-Ins:  Monday and Wednesday: 8 am until slots are full. Every 1st and 2nd Fridays of the month: 1 pm - 5 pm.  NO ASSESSMENT/THERAPY WALK-INS ON TUESDAYS OR THURSDAYS  New Patient Assessment/Medication Management Walk-Ins:  Monday - Friday:  8 am - 11 am.  For all walk-ins, we ask that you arrive by 7:30 am because patients will be seen in the order of arrival.  Availability is limited; therefore, you may not be seen on the same day that you walk-in.  Our goal is to serve and meet the needs of our community to the best of our [place] ability.

## 2023-05-21 NOTE — Tx Team (Signed)
 LCSW and MD spoke with patient on this morning at bedside to discuss reason for admission and disposition plans.  Patient reports she presented in order to detox from her current substance use.  Patient reports a daily use of heroin and crack cocaine via snorting. Patient denies IV drug use.  Patient denies any other substance use at this time.  Patient reports she uses $120 worth of heroin daily.  Patient reports she uses between $120-$150 worth of cocaine daily.  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  Patient reports her goal is to get off of the heroin, however reports she does not want to quit crack cocaine use.  Patient reports she lives in a tent in [place] and plans to return back to her tent once she is stable for discharge.  Patient asked if she could discharge on today, however MD informed the patient that she is still currently detoxing and will need to remain in the facility at least 72 hours.  Patient expressed understanding. Patient reports she has tried to go to rehab in the past, however left AMA before being admitted. Patient reports she does not want treatment at this time as she is not motivated for change. Patient asked again if she could leave today and brief counseling was provided to the patient. Patient later then stated, "If I have to be here then I will go to treatment".  Patient has provided permission for LCSW to send her clinical information out for review.  LCSW will provide an update once received.  Patient is known to this clinician, and this was the same presentation as last admission.  Patient expressed an interest in residential placement, however left AMA prior to securing placement. Referrals will be sent out. Updates to be provided as received.   Fernande Boyden, LCSW Clinical Social Worker [place] [facility] Ph: [phone]

## 2023-05-22 DIAGNOSIS — F1520 Other stimulant dependence, uncomplicated: Secondary | ICD-10-CM | POA: Diagnosis not present

## 2023-05-22 DIAGNOSIS — F1994 Other psychoactive substance use, unspecified with psychoactive substance-induced mood disorder: Secondary | ICD-10-CM | POA: Diagnosis not present

## 2023-05-22 DIAGNOSIS — F1423 Cocaine dependence with withdrawal: Secondary | ICD-10-CM | POA: Diagnosis not present

## 2023-05-22 DIAGNOSIS — F19982 Other psychoactive substance use, unspecified with psychoactive substance-induced sleep disorder: Secondary | ICD-10-CM | POA: Diagnosis not present

## 2023-05-22 MED ORDER — NICOTINE POLACRILEX 2 MG MT GUM
2.0000 mg | CHEWING_GUM | OROMUCOSAL | Status: DC
  Administered 2023-05-22 (×2): 2 mg via ORAL
  Filled 2023-05-22 (×5): qty 1

## 2023-05-22 NOTE — ED Notes (Signed)
 Pt is in the dayroom watching TV with peers. Pt denies SI/HI/AVH. Pt endorses anxiety and pain, in which writer informed her, medication can be administered to help. Pt has no further complain.No acute distress noted. Will continue to monitor for safety and provide support.

## 2023-05-22 NOTE — Group Note (Signed)
 Group Topic: Healthy Self Image and Positive Change  Group Date: 05/21/2023 Start Time: 2015 End Time: 2030 Facilitators: Darin Engels  Department: [facility]  Number of Participants: 5  Group Focus: leisure skills and reminiscence Treatment Modality:  Leisure Development Interventions utilized were leisure development Purpose: express feelings, regain self-worth, and reinforce self-care  Name: Cassidy Underwood [facility] Date of Birth: [birth year]  MR: [id number]    Level of Participation: pt did not attend group  Quality of Participation: pt did not attend group  Interactions with others: minimal  Mood/Affect: appropriate Triggers (if applicable): n/a Cognition: coherent/clear Progress: Gaining insight Response: pt stated she gets anxious around large groups of people, she did not want to attend group but sat outside the door to listen  Plan: patient will be encouraged to attend groups  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   Polysubstance dependence (HCC) 05/18/2023   Recurrent major depressive disorder (HCC) 04/02/2023   Rhabdomyolysis 06/25/2021   HO Drug overdose, accidental or unintentional 06/25/2021   AKI (acute kidney injury) (HCC) 06/25/2021   Leukocytosis 06/25/2021   Elevated LFTs 06/25/2021   Hyperglycemia 06/25/2021   Hearing loss 06/25/2021   [redacted] weeks gestation of pregnancy 12/17/2018   Normal labor 12/16/2018   Substance induced mood disorder (HCC)    Cocaine use disorder, severe, dependence (HCC) 02/22/2018   Major depressive disorder, single episode, severe without psychotic features (HCC) 02/22/2018   Major depressive  disorder, single episode, severe without psychosis (HCC) 02/22/2018   Indication for care in labor or delivery 11/13/2017   Pregnancy with adoption planned 10/05/2017   IUGR (intrauterine growth restriction) affecting care of mother 10/05/2017   Supervision of high risk pregnancy, antepartum 08/02/2017   Polysubstance abuse (HCC) 07/29/2017   Homelessness 11/02/2016

## 2023-05-22 NOTE — ED Notes (Signed)
 Patient woke up and walked to the nurses' station asking who was calling her. Patient was reassured that, no body was calling her and that the MHT was doing her rounds checking on the patients. Pt went back to the bedroom to sleep. Will keep monitoring for safety.

## 2023-05-22 NOTE — ED Notes (Signed)
Patient remains asleep in bed without distress or complaint.  Will monitor.

## 2023-05-22 NOTE — ED Notes (Signed)
 Patient in the bedroom calm and composed cresting on the bed. Pt stated she hopes to sleep after taking sleeping med. Pt reassured. NAD. Respirations are even and unlabored. Will monitor for safety

## 2023-05-22 NOTE — Group Note (Signed)
 Group Topic: Relapse and Recovery  Group Date: 05/22/2023 Start Time: 1500 End Time: 1520 Facilitators: Jenean Lindau, RN  Department: [facility]  Number of Participants: 6  Group Focus: chemical dependency education, chemical dependency issues, co-dependency, and coping skills Treatment Modality:  Behavior Modification Therapy Interventions utilized were patient education Purpose: express feelings, express irrational fears, improve communication skills, increase insight, regain self-worth, and reinforce self-care  Name: Cassidy Underwood [facility] Date of Birth: Jun 21, 1995  MR: [id number]    Level of Participation: minimal Quality of Participation: attentive Interactions with others: gave feedback Mood/Affect: appropriate Triggers (if applicable):   Cognition: coherent/clear Progress: Moderate Response:   Plan: follow-up needed  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   Polysubstance dependence (HCC) 05/18/2023   Recurrent major depressive disorder (HCC) 04/02/2023   Rhabdomyolysis 06/25/2021   HO Drug overdose, accidental or unintentional 06/25/2021   AKI (acute kidney injury) (HCC) 06/25/2021   Leukocytosis 06/25/2021   Elevated LFTs 06/25/2021   Hyperglycemia 06/25/2021   Hearing loss 06/25/2021   [redacted] weeks gestation of pregnancy 12/17/2018   Normal labor 12/16/2018   Substance induced mood disorder (HCC)    Cocaine use disorder, severe, dependence (HCC) 02/22/2018   Major depressive disorder, single episode, severe without psychotic features (HCC) 02/22/2018   Major depressive disorder, single episode, severe without psychosis (HCC)  02/22/2018   Indication for care in labor or delivery 11/13/2017   Pregnancy with adoption planned 10/05/2017   IUGR (intrauterine growth restriction) affecting care of mother 10/05/2017   Supervision of high risk pregnancy, antepartum 08/02/2017   Polysubstance abuse (HCC) 07/29/2017   Homelessness 11/02/2016

## 2023-05-22 NOTE — Discharge Planning (Signed)
 LCSW went and spoke with patient to inform her of that [facility] and [facility] are willing to consider her for placement. Patient reported to this Clinician that she wants to be discharged on tomorrow morning back to her tent. Patient reports she is not interested in residential placement at this time. Patient aware that LCSW will inform MD of her decision. No other needs were reported at this time.   MD made aware.   Fernande Boyden, LCSW Clinical Social Worker [place] [facility] Ph: [phone]

## 2023-05-22 NOTE — ED Notes (Signed)
 Patient is sleeping. Respirations equal and unlabored, skin warm and dry. No change in assessment or acuity. Routine safety checks conducted according to facility protocol. Will continue to monitor for safety.

## 2023-05-22 NOTE — ED Notes (Signed)
 Patient has been awake most of the day.  She makes needs known to staff.  She showered and nicotine gum was ordered for her.  Patient has asked for discharge and will possibly be discharged tomorrow.  Patient states she does not want rehab.  She has been calm and pleasant.  Soft spoken and without distress.  Will continue to monitor.

## 2023-05-22 NOTE — Group Note (Signed)
 Group Topic: Social Support  Group Date: 05/22/2023 Start Time: 2000 End Time: 2030 Facilitators: Rae Lips B  Department: [facility]  Number of Participants: 3  Group Focus: abuse issues, acceptance, activities of daily living skills, affirmation, anxiety, check in, clarity of thought, communication, coping skills, daily focus, depression, discharge education, family, feeling awareness/expression, goals/reality orientation, healthy friendships, individual meeting, relapse prevention, safety plan, self-awareness, self-esteem, and social skills Treatment Modality:  Individual Therapy Interventions utilized were leisure development and support Purpose: enhance coping skills, express feelings, improve communication skills, increase insight, regain self-worth, reinforce self-care, relapse prevention strategies, and trigger / craving management  Name: Cassidy Underwood [facility] Date of Birth: 12-06-1995  MR: [id number]    Level of Participation: active Quality of Participation: attentive, cooperative, initiates communication, motivated, and offered feedback Interactions with others: gave feedback Mood/Affect: appropriate, brightens with interaction, and positive Triggers (if applicable): Being by herself.  Cognition: coherent/clear and insightful Progress: Gaining insight Response: She is worrying about discharging tom. She said she came with her bf but she doesn't know where he went to. She's scared of being alone and said that he is her only protection and she would really like to try and get in touch with him if she can so she can find him.  Plan: changes to treatment plan she said she may want to reconsider discharging home and discharge to a rehab.   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   History of admission to inpatient psychiatry department 05/19/2023   History of  suicide attempt 05/19/2023   Cocaine abuse (HCC)    Methamphetamine dependence (HCC) 05/18/2023   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   Polysubstance dependence (HCC) 05/18/2023   Recurrent major depressive disorder (HCC) 04/02/2023   Rhabdomyolysis 06/25/2021   HO Drug overdose, accidental or unintentional 06/25/2021   AKI (acute kidney injury) (HCC) 06/25/2021   Leukocytosis 06/25/2021   Elevated LFTs 06/25/2021   Hyperglycemia 06/25/2021   Hearing loss 06/25/2021   [redacted] weeks gestation of pregnancy 12/17/2018   Normal labor 12/16/2018   Substance induced mood disorder (HCC)    Cocaine use disorder, severe, dependence (HCC) 02/22/2018   Major depressive disorder, single episode, severe without psychotic features (HCC) 02/22/2018   Major depressive disorder, single episode, severe without psychosis (HCC) 02/22/2018   Indication for care in labor or delivery 11/13/2017   Pregnancy with adoption planned 10/05/2017   IUGR (intrauterine growth restriction) affecting care of mother 10/05/2017   Supervision of high risk pregnancy, antepartum 08/02/2017   Polysubstance abuse (HCC) 07/29/2017   Homelessness 11/02/2016

## 2023-05-22 NOTE — Group Note (Signed)
 Group Topic: Change and Accountability  Group Date: 05/22/2023 Start Time: 1640 End Time: 1700 Facilitators: Cristal Ford  Department: [facility]  Number of Participants: 3  Group Focus: affirmation and daily focus Treatment Modality:  Psychoeducation Interventions utilized were patient education and problem solving Purpose: regain self-worth and reinforce self-care  Name: Cassidy Underwood [facility] Date of Birth: 1995-07-16  MR: [id number]    Level of Participation: active Quality of Participation: attentive and cooperative Interactions with others: gave feedback Mood/Affect: positive Triggers (if applicable): N/A Cognition: coherent/clear Progress: Moderate Response: pt is not willing to go to inpatient at this time Plan: follow-up needed  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   Polysubstance dependence (HCC) 05/18/2023   Recurrent major depressive disorder (HCC) 04/02/2023   Rhabdomyolysis 06/25/2021   HO Drug overdose, accidental or unintentional 06/25/2021   AKI (acute kidney injury) (HCC) 06/25/2021   Leukocytosis 06/25/2021   Elevated LFTs 06/25/2021   Hyperglycemia 06/25/2021   Hearing loss 06/25/2021   [redacted] weeks gestation of pregnancy 12/17/2018   Normal labor 12/16/2018   Substance induced mood disorder (HCC)    Cocaine use disorder, severe, dependence (HCC) 02/22/2018   Major depressive disorder, single episode, severe without psychotic features (HCC) 02/22/2018   Major depressive disorder, single episode, severe without psychosis (HCC) 02/22/2018   Indication for care in labor or delivery 11/13/2017   Pregnancy with adoption  planned 10/05/2017   IUGR (intrauterine growth restriction) affecting care of mother 10/05/2017   Supervision of high risk pregnancy, antepartum 08/02/2017   Polysubstance abuse (HCC) 07/29/2017   Homelessness 11/02/2016

## 2023-05-22 NOTE — ED Provider Notes (Signed)
 Behavioral Health Progress Note  Date and Time: 05/22/2023 1:40 PM Name: Cassidy Underwood [facility] MRN:  [id number] CC: opioid detox    Cassidy Underwood [facility] is a 28 y.o. 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 This is not her first time at [facility].   Subjective:   Chart reviewed, case discussed with social worker, patient seen during rounds.  Patient reports that she is confused, she does not know if she wants to go to a rehab facility or back to her tent.  Patient was encouraged to consider going to a rehab program and work on sobriety.  Social worker shared that she has provided the patient with information on [facility].  Patient was encouraged to call them for an interview.  Patient denies nausea vomiting or diarrhea today.  She continues to report body aches and insomnia.  Patient was encouraged to consider inpatient rehab program.  She was encouraged to abstain from drug use and work on sobriety.  Today patient denies thoughts of harming herself or others.  She denies psychotic or manic symptoms.   Review of Systems  Constitutional:  Positive for malaise/fatigue. Negative for chills.  HENT:  Positive for congestion.   Respiratory:  Negative for shortness of breath.   Gastrointestinal:  Negative for abdominal pain, diarrhea and nausea.  Musculoskeletal:  Positive for back pain and myalgias. Negative for falls.  Neurological:  Positive for headaches. Negative for dizziness, tremors, speech change and weakness.    Diagnosis:  Final diagnoses:  Sedative or hypnotic abuse (HCC)  Cocaine use disorder, severe, dependence (HCC)  Substance induced mood disorder (HCC)   Psychoactive substance-induced insomnia (HCC)  Homelessness  Methamphetamine dependence (HCC)  HO Drug overdose, accidental or unintentional  History of admission to inpatient psychiatry department  History of suicide attempt   Past Psychiatric History: See H&P Past Medical History: See H&P Family History: See H&P Family Psychiatric  History: See H&P Social History: See H&P  Total Time spent with patient: 30 minutes  Additional Social History:     Patient reports that she is homeless and lives in a tent                    Current Medications:  Current Facility-Administered Medications  Medication Dose Route Frequency Provider Last Rate Last Admin   acetaminophen (TYLENOL) tablet 650 mg  650 mg Oral Q6H PRN Roselle Locus, MD   650 mg at 05/19/23 1645   alum & mag hydroxide-simeth (MAALOX/MYLANTA) 200-200-20 MG/5ML suspension 30 mL  30 mL Oral Q4H PRN Roselle Locus, MD       [START ON 05/25/2023] cloNIDine (CATAPRES) tablet 0.1 mg  0.1 mg Oral Mignon Pine, Raynelle Fanning, DO       cloNIDine (CATAPRES) tablet 0.2 mg  0.2 mg Oral Doreatha Martin, DO   0.2 mg at 05/22/23 0836   dicyclomine (BENTYL) tablet 20 mg  20 mg Oral Q6H PRN Roselle Locus, MD   20 mg at 05/20/23 2251   haloperidol lactate (HALDOL) injection 5 mg  5 mg Intramuscular TID PRN Roselle Locus, MD   5 mg at 05/18/23 2009   And   diphenhydrAMINE (BENADRYL) injection 50 mg  50 mg Intramuscular TID PRN Roselle Locus, MD   50 mg at 05/18/23 2009   And   LORazepam (ATIVAN) injection 2 mg  2 mg Intramuscular TID PRN Roselle Locus, MD   2 mg at 05/18/23 2009   feeding supplement (BOOST / RESOURCE BREEZE) liquid 1 Container  1 Container Oral TID BM Princess Bruins, DO   1 Container at 05/22/23 6387   hydrOXYzine (ATARAX) tablet 25 mg  25 mg Oral TID PRN Roselle Locus, MD   25 mg at 05/21/23 2350   loperamide (IMODIUM) capsule 2-4 mg  2-4 mg Oral PRN Roselle Locus, MD   4 mg at 05/20/23 1828   magnesium hydroxide (MILK OF MAGNESIA) suspension 30 mL  30 mL Oral Daily PRN Roselle Locus, MD       methocarbamol (ROBAXIN) tablet 500 mg  500 mg Oral Q8H PRN Princess Bruins, DO   500 mg at 05/22/23 1314   mirtazapine (REMERON) tablet 15 mg  15 mg Oral QHS Princess Bruins, DO   15 mg at 05/21/23 2132   naproxen (NAPROSYN) tablet 500 mg  500 mg Oral BID PRN Roselle Locus, MD   500 mg at 05/21/23 2350   nicotine polacrilex (NICORETTE) gum 2 mg  2 mg Oral Q4H while awake Lewanda Rife, MD       OLANZapine (ZYPREXA) tablet 10 mg  10 mg Oral QHS Hill, Shelbie Hutching, MD   10 mg at 05/21/23 2131   ondansetron (ZOFRAN-ODT) disintegrating tablet 4 mg  4 mg Oral Q6H PRN Roselle Locus, MD       traZODone (DESYREL) tablet 50 mg  50 mg Oral QHS Lewanda Rife, MD   50 mg at 05/21/23 2131   No current outpatient medications on file.   Labs  Lab Results:  Admission on 05/17/2023, Discharged on 05/17/2023  Component Date Value Ref Range Status   WBC 05/17/2023 9.5  4.0 - 10.5 K/uL Final   RBC 05/17/2023 4.29  3.87 - 5.11 MIL/uL Final   Hemoglobin 05/17/2023 12.7  12.0 - 15.0 g/dL Final   HCT 56/43/3295 37.9  36.0 - 46.0 % Final   MCV 05/17/2023 88.3  80.0 - 100.0 fL Final   MCH 05/17/2023 29.6  26.0 - 34.0 pg Final   MCHC 05/17/2023 33.5  30.0 - 36.0 g/dL Final   RDW 18/84/1660 12.2  11.5 - 15.5 % Final   Platelets 05/17/2023 355  150 - 400 K/uL Final   nRBC 05/17/2023 0.0  0.0 - 0.2 % Final   Neutrophils Relative % 05/17/2023 49  % Final   Neutro Abs 05/17/2023 4.6  1.7 - 7.7 K/uL Final   Lymphocytes Relative 05/17/2023 41  % Final   Lymphs Abs 05/17/2023 3.9  0.7 - 4.0 K/uL Final   Monocytes Relative 05/17/2023 6  % Final   Monocytes Absolute 05/17/2023 0.6  0.1 - 1.0 K/uL Final   Eosinophils Relative 05/17/2023 4  % Final   Eosinophils Absolute 05/17/2023 0.3  0.0 - 0.5 K/uL Final   Basophils Relative 05/17/2023 0  % Final    Basophils Absolute 05/17/2023 0.0  0.0 - 0.1 K/uL Final   Immature Granulocytes 05/17/2023 0  % Final   Abs Immature Granulocytes 05/17/2023 0.04  0.00 - 0.07 K/uL Final   Performed at [facility], [address] [place], [place] [address]   Sodium 05/17/2023 141  135 - 145 mmol/L Final   Potassium 05/17/2023 4.5  3.5 -  5.1 mmol/L Final   Chloride 05/17/2023 106  98 - 111 mmol/L Final   CO2 05/17/2023 25  22 - 32 mmol/L Final   Glucose, Bld 05/17/2023 84  70 - 99 mg/dL Final   Glucose reference range applies only to samples taken after fasting for at least 8 hours.   BUN 05/17/2023 8  6 - 20 mg/dL Final   Creatinine, Ser 05/17/2023 0.77  0.44 - 1.00 mg/dL Final   Calcium 42/70/6237 9.0  8.9 - 10.3 mg/dL Final   Total Protein 62/83/1517 6.3 (L)  6.5 - 8.1 g/dL Final   Albumin 61/60/7371 3.5  3.5 - 5.0 g/dL Final   AST 08/09/9483 22  15 - 41 U/L Final   ALT 05/17/2023 16  0 - 44 U/L Final   Alkaline Phosphatase 05/17/2023 57  38 - 126 U/L Final   Total Bilirubin 05/17/2023 0.6  0.0 - 1.2 mg/dL Final   GFR, Estimated 05/17/2023 >60  >60 mL/min Final   Comment: (NOTE) Calculated using the CKD-EPI Creatinine Equation (2021)    Anion gap 05/17/2023 10  5 - 15 Final   Performed at [facility], [address], [place], [place] [address]   Hgb A1c MFr Bld 05/17/2023 5.2  4.8 - 5.6 % Final   Comment: (NOTE) Pre diabetes:          5.7%-6.4%  Diabetes:              >6.4%  Glycemic control for   <7.0% adults with diabetes    Mean Plasma Glucose 05/17/2023 102.54  mg/dL Final   Performed at [facility], [address] [place], [place] [address]   Alcohol, Ethyl (B) 05/17/2023 <10  <10 mg/dL Final   Comment: (NOTE) Lowest detectable limit for serum alcohol is 10 mg/dL.  For medical purposes only. Performed at [facility], [address], [place], [place] [address]    Cholesterol 05/17/2023 142  0 - 200 mg/dL Final   Triglycerides 99/37/1696 237 (H)  <150  mg/dL Final   HDL 78/93/8101 59  >40 mg/dL Final   Total CHOL/HDL Ratio 05/17/2023 2.4  RATIO Final   VLDL 05/17/2023 47 (H)  0 - 40 mg/dL Final   LDL Cholesterol 05/17/2023 36  0 - 99 mg/dL Final   Comment:        Total Cholesterol/HDL:CHD Risk Coronary Heart Disease Risk Table                     Men   Women  1/2 Average Risk   3.4   3.3  Average Risk       5.0   4.4  2 X Average Risk   9.6   7.1  3 X Average Risk  23.4   11.0        Use the calculated Patient Ratio above and the CHD Risk Table to determine the patient's CHD Risk.        ATP III CLASSIFICATION (LDL):  <100     mg/dL   Optimal  751-025  mg/dL   Near or Above                    Optimal  130-159  mg/dL   Borderline  852-778  mg/dL   High  >242     mg/dL   Very High Performed at [facility], [address] [place], [place] [address]    TSH 05/17/2023 0.589  0.350 - 4.500 uIU/mL Final   Comment: Performed by a 3rd Generation assay with a functional sensitivity of <=0.01 uIU/mL. Performed at [facility], [address] [place], [place] [address]    Color, Urine 05/17/2023 YELLOW  YELLOW Final   APPearance 05/17/2023 CLEAR  CLEAR Final   Specific Gravity, Urine 05/17/2023 1.025  1.005 - 1.030 Final   pH 05/17/2023 6.0  5.0 - 8.0 Final   Glucose, UA 05/17/2023 NEGATIVE  NEGATIVE mg/dL Final   Hgb urine dipstick 05/17/2023 NEGATIVE  NEGATIVE Final   Bilirubin Urine 05/17/2023 NEGATIVE  NEGATIVE Final   Ketones, ur 05/17/2023 NEGATIVE  NEGATIVE mg/dL Final   Protein, ur 60/45/4098 NEGATIVE  NEGATIVE mg/dL Final   Nitrite 11/91/4782 NEGATIVE  NEGATIVE Final   Leukocytes,Ua 05/17/2023 NEGATIVE  NEGATIVE Final   Comment: Microscopic not done on urines with negative protein, blood, leukocytes, nitrite, or glucose < 500 mg/dL. Performed at [facility], [address] [place], [place] [address]    Preg Test, Ur 05/17/2023 Negative  Negative Final   POC Amphetamine UR 05/17/2023 None Detected   NONE DETECTED (Cut Off Level 1000 ng/mL) Final   POC Secobarbital (BAR) 05/17/2023 None Detected  NONE DETECTED (Cut Off Level 300 ng/mL) Final   POC Buprenorphine (BUP) 05/17/2023 None Detected  NONE DETECTED (Cut Off Level 10 ng/mL) Final   POC Oxazepam (BZO) 05/17/2023 None Detected  NONE DETECTED (Cut Off Level 300 ng/mL) Final   POC Cocaine UR 05/17/2023 Positive (A)  NONE DETECTED (Cut Off Level 300 ng/mL) Final   POC Methamphetamine UR 05/17/2023 None Detected  NONE DETECTED (Cut Off Level 1000 ng/mL) Final   POC Morphine 05/17/2023 Positive (A)  NONE DETECTED (Cut Off Level 300 ng/mL) Final   POC Methadone UR 05/17/2023 None Detected  NONE DETECTED (Cut Off Level 300 ng/mL) Final   POC Oxycodone UR 05/17/2023 None Detected  NONE DETECTED (Cut Off Level 100 ng/mL) Final   POC Marijuana UR 05/17/2023 None Detected  NONE DETECTED (Cut Off Level 50 ng/mL) Final  Admission on 03/29/2023, Discharged on 04/02/2023  Component Date Value Ref Range Status   SARS Coronavirus 2 by RT PCR 03/31/2023 NEGATIVE  NEGATIVE Final   Performed at [facility], [address] [place], [place] [address]   WBC 03/29/2023 10.1  4.0 - 10.5 K/uL Final   RBC 03/29/2023 4.62  3.87 - 5.11 MIL/uL Final   Hemoglobin 03/29/2023 13.2  12.0 - 15.0 g/dL Final   HCT 78/46/9629 39.5  36.0 - 46.0 % Final   MCV 03/29/2023 85.5  80.0 - 100.0 fL Final   MCH 03/29/2023 28.6  26.0 - 34.0 pg Final   MCHC 03/29/2023 33.4  30.0 - 36.0 g/dL Final   RDW 52/84/1324 11.7  11.5 - 15.5 % Final   Platelets 03/29/2023 407 (H)  150 - 400 K/uL Final   nRBC 03/29/2023 0.0  0.0 - 0.2 % Final   Neutrophils Relative % 03/29/2023 53  % Final   Neutro Abs 03/29/2023 5.2  1.7 - 7.7 K/uL Final   Lymphocytes Relative 03/29/2023 41  % Final   Lymphs Abs 03/29/2023 4.1 (H)  0.7 - 4.0 K/uL Final   Monocytes Relative 03/29/2023 5  % Final   Monocytes Absolute 03/29/2023 0.5  0.1 - 1.0 K/uL Final   Eosinophils Relative 03/29/2023 1  % Final    Eosinophils Absolute 03/29/2023 0.1  0.0 - 0.5 K/uL Final   Basophils Relative 03/29/2023 0  %  Final   Basophils Absolute 03/29/2023 0.0  0.0 - 0.1 K/uL Final   Immature Granulocytes 03/29/2023 0  % Final   Abs Immature Granulocytes 03/29/2023 0.04  0.00 - 0.07 K/uL Final   Performed at [facility], [address], [place], [place] [address]   Sodium 03/29/2023 140  135 - 145 mmol/L Final   Potassium 03/29/2023 3.7  3.5 - 5.1 mmol/L Final   Chloride 03/29/2023 105  98 - 111 mmol/L Final   CO2 03/29/2023 25  22 - 32 mmol/L Final   Glucose, Bld 03/29/2023 86  70 - 99 mg/dL Final   Glucose reference range applies only to samples taken after fasting for at least 8 hours.   BUN 03/29/2023 13  6 - 20 mg/dL Final   Creatinine, Ser 03/29/2023 0.75  0.44 - 1.00 mg/dL Final   Calcium 53/66/4403 9.0  8.9 - 10.3 mg/dL Final   Total Protein 47/42/5956 6.7  6.5 - 8.1 g/dL Final   Albumin 38/75/6433 3.8  3.5 - 5.0 g/dL Final   AST 29/51/8841 31  15 - 41 U/L Final   ALT 03/29/2023 28  0 - 44 U/L Final   Alkaline Phosphatase 03/29/2023 61  38 - 126 U/L Final   Total Bilirubin 03/29/2023 0.6  0.0 - 1.2 mg/dL Final   GFR, Estimated 03/29/2023 >60  >60 mL/min Final   Comment: (NOTE) Calculated using the CKD-EPI Creatinine Equation (2021)    Anion gap 03/29/2023 10  5 - 15 Final   Performed at [facility], [address] [place], [place] [address]   Alcohol, Ethyl (B) 03/29/2023 <10  <10 mg/dL Final   Comment: (NOTE) Lowest detectable limit for serum alcohol is 10 mg/dL.  For medical purposes only. Performed at [facility], [address], [place], [place] [address]    TSH 03/29/2023 0.378  0.350 - 4.500 uIU/mL Final   Comment: Performed by a 3rd Generation assay with a functional sensitivity of <=0.01 uIU/mL. Performed at [facility], [address] [place], [place] [address]    Preg Test, Ur 03/29/2023 Negative  Negative Final   POC Amphetamine UR 03/29/2023 None  Detected  NONE DETECTED (Cut Off Level 1000 ng/mL) Final   POC Secobarbital (BAR) 03/29/2023 None Detected  NONE DETECTED (Cut Off Level 300 ng/mL) Final   POC Buprenorphine (BUP) 03/29/2023 Positive (A)  NONE DETECTED (Cut Off Level 10 ng/mL) Final   POC Oxazepam (BZO) 03/29/2023 Positive (A)  NONE DETECTED (Cut Off Level 300 ng/mL) Final   POC Cocaine UR 03/29/2023 Positive (A)  NONE DETECTED (Cut Off Level 300 ng/mL) Final   POC Methamphetamine UR 03/29/2023 None Detected  NONE DETECTED (Cut Off Level 1000 ng/mL) Final   POC Morphine 03/29/2023 None Detected  NONE DETECTED (Cut Off Level 300 ng/mL) Final   POC Methadone UR 03/29/2023 None Detected  NONE DETECTED (Cut Off Level 300 ng/mL) Final   POC Oxycodone UR 03/29/2023 None Detected  NONE DETECTED (Cut Off Level 100 ng/mL) Final   POC Marijuana UR 03/29/2023 None Detected  NONE DETECTED (Cut Off Level 50 ng/mL) Final  Admission on 03/28/2023, Discharged on 03/28/2023  Component Date Value Ref Range Status   Total CK 03/28/2023 361 (H)  38 - 234 U/L Final   Performed at [facility], [address], [place], [place] [address]   WBC 03/28/2023 11.2 (H)  4.0 - 10.5 K/uL Final   RBC 03/28/2023 4.49  3.87 -  5.11 MIL/uL Final   Hemoglobin 03/28/2023 13.2  12.0 - 15.0 g/dL Final   HCT 91/47/8295 38.2  36.0 - 46.0 % Final   MCV 03/28/2023 85.1  80.0 - 100.0 fL Final   MCH 03/28/2023 29.4  26.0 - 34.0 pg Final   MCHC 03/28/2023 34.6  30.0 - 36.0 g/dL Final   RDW 62/13/0865 11.6  11.5 - 15.5 % Final   Platelets 03/28/2023 448 (H)  150 - 400 K/uL Final   nRBC 03/28/2023 0.0  0.0 - 0.2 % Final   Neutrophils Relative % 03/28/2023 37  % Final   Neutro Abs 03/28/2023 4.2  1.7 - 7.7 K/uL Final   Lymphocytes Relative 03/28/2023 54  % Final   Lymphs Abs 03/28/2023 6.0 (H)  0.7 - 4.0 K/uL Final   Monocytes Relative 03/28/2023 6  % Final   Monocytes Absolute 03/28/2023 0.7  0.1 - 1.0 K/uL Final   Eosinophils Relative 03/28/2023 3   % Final   Eosinophils Absolute 03/28/2023 0.4  0.0 - 0.5 K/uL Final   Basophils Relative 03/28/2023 0  % Final   Basophils Absolute 03/28/2023 0.1  0.0 - 0.1 K/uL Final   Immature Granulocytes 03/28/2023 0  % Final   Abs Immature Granulocytes 03/28/2023 0.05  0.00 - 0.07 K/uL Final   Performed at [facility], [address] [place], [place] [address]   Sodium 03/28/2023 134 (L)  135 - 145 mmol/L Final   Potassium 03/28/2023 3.8  3.5 - 5.1 mmol/L Final   Chloride 03/28/2023 98  98 - 111 mmol/L Final   CO2 03/28/2023 26  22 - 32 mmol/L Final   Glucose, Bld 03/28/2023 111 (H)  70 - 99 mg/dL Final   Glucose reference range applies only to samples taken after fasting for at least 8 hours.   BUN 03/28/2023 22 (H)  6 - 20 mg/dL Final   Creatinine, Ser 03/28/2023 0.89  0.44 - 1.00 mg/dL Final   Calcium 62/95/2841 8.7 (L)  8.9 - 10.3 mg/dL Final   GFR, Estimated 03/28/2023 >60  >60 mL/min Final   Comment: (NOTE) Calculated using the CKD-EPI Creatinine Equation (2021)    Anion gap 03/28/2023 10  5 - 15 Final   Performed at [facility], [address] [place], [place] [address]   SARS Coronavirus 2 by RT PCR 03/28/2023 NEGATIVE  NEGATIVE Final   Comment: (NOTE) SARS-CoV-2 target nucleic acids are NOT DETECTED.  The SARS-CoV-2 RNA is generally detectable in upper respiratory specimens during the acute phase of infection. The lowest concentration of SARS-CoV-2 viral copies this assay can detect is 138 copies/mL. A negative result does not preclude SARS-Cov-2 infection and should not be used as the sole basis for treatment or other patient management decisions. A negative result may occur with  improper specimen collection/handling, submission of specimen other than nasopharyngeal swab, presence of viral mutation(s) within the areas targeted by this assay, and inadequate number of viral copies(<138 copies/mL). A negative result must be combined  with clinical observations, patient history, and epidemiological information. The expected result is Negative.  Fact Sheet for Patients:  BloggerCourse.com  Fact Sheet for Healthcare Providers:  SeriousBroker.it  This test is no                          t yet approved or cleared by the [place] FDA and  has been authorized for detection and/or diagnosis of SARS-CoV-2 by FDA under an  Emergency Use Authorization (EUA). This EUA will remain  in effect (meaning this test can be used) for the duration of the COVID-19 declaration under Section 564(b)(1) of the Act, 21 U.S.C.section 360bbb-3(b)(1), unless the authorization is terminated  or revoked sooner.       Influenza A by PCR 03/28/2023 NEGATIVE  NEGATIVE Final   Influenza B by PCR 03/28/2023 NEGATIVE  NEGATIVE Final   Comment: (NOTE) The Xpert Xpress SARS-CoV-2/FLU/RSV plus assay is intended as an aid in the diagnosis of influenza from Nasopharyngeal swab specimens and should not be used as a sole basis for treatment. Nasal washings and aspirates are unacceptable for Xpert Xpress SARS-CoV-2/FLU/RSV testing.  Fact Sheet for Patients: BloggerCourse.com  Fact Sheet for Healthcare Providers: SeriousBroker.it  This test is not yet approved or cleared by the [place] FDA and has been authorized for detection and/or diagnosis of SARS-CoV-2 by FDA under an Emergency Use Authorization (EUA). This EUA will remain in effect (meaning this test can be used) for the duration of the COVID-19 declaration under Section 564(b)(1) of the Act, 21 U.S.C. section 360bbb-3(b)(1), unless the authorization is terminated or revoked.     Resp Syncytial Virus by PCR 03/28/2023 NEGATIVE  NEGATIVE Final   Comment: (NOTE) Fact Sheet for Patients: BloggerCourse.com  Fact Sheet for Healthcare  Providers: SeriousBroker.it  This test is not yet approved or cleared by the [place] FDA and has been authorized for detection and/or diagnosis of SARS-CoV-2 by FDA under an Emergency Use Authorization (EUA). This EUA will remain in effect (meaning this test can be used) for the duration of the COVID-19 declaration under Section 564(b)(1) of the Act, 21 U.S.C. section 360bbb-3(b)(1), unless the authorization is terminated or revoked.  Performed at [facility], [address], [place], [place] [address]    Specimen Source 03/28/2023 URINE, CLEAN CATCH   Final   Color, Urine 03/28/2023 YELLOW  YELLOW Final   APPearance 03/28/2023 CLOUDY (A)  CLEAR Final   Specific Gravity, Urine 03/28/2023 1.024  1.005 - 1.030 Final   pH 03/28/2023 8.0  5.0 - 8.0 Final   Glucose, UA 03/28/2023 NEGATIVE  NEGATIVE mg/dL Final   Hgb urine dipstick 03/28/2023 NEGATIVE  NEGATIVE Final   Bilirubin Urine 03/28/2023 NEGATIVE  NEGATIVE Final   Ketones, ur 03/28/2023 NEGATIVE  NEGATIVE mg/dL Final   Protein, ur 60/45/4098 100 (A)  NEGATIVE mg/dL Final   Nitrite 11/91/4782 POSITIVE (A)  NEGATIVE Final   Leukocytes,Ua 03/28/2023 MODERATE (A)  NEGATIVE Final   RBC / HPF 03/28/2023 0-5  0 - 5 RBC/hpf Final   WBC, UA 03/28/2023 >50  0 - 5 WBC/hpf Final   Comment:        Reflex urine culture not performed if WBC <=10, OR if Squamous epithelial cells >5. If Squamous epithelial cells >5 suggest recollection.    Bacteria, UA 03/28/2023 MANY (A)  NONE SEEN Final   Squamous Epithelial / HPF 03/28/2023 0-5  0 - 5 /HPF Final   Mucus 03/28/2023 PRESENT   Final   Hyaline Casts, UA 03/28/2023 PRESENT   Final   Triple Phosphate Crystal 03/28/2023 PRESENT   Final   Performed at [facility], [address] [place], [place] [address]   Opiates 03/28/2023 NONE DETECTED  NONE DETECTED Final   Cocaine 03/28/2023 POSITIVE (A)  NONE DETECTED Final    Benzodiazepines 03/28/2023 POSITIVE (A)  NONE DETECTED Final   Amphetamines 03/28/2023 POSITIVE (A)  NONE DETECTED Final   Tetrahydrocannabinol 03/28/2023 NONE DETECTED  NONE DETECTED Final   Barbiturates 03/28/2023 NONE DETECTED  NONE DETECTED Final   Comment: (NOTE) DRUG SCREEN FOR MEDICAL PURPOSES ONLY.  IF CONFIRMATION IS NEEDED FOR ANY PURPOSE, NOTIFY LAB WITHIN 5 DAYS.  LOWEST DETECTABLE LIMITS FOR URINE DRUG SCREEN Drug Class                     Cutoff (ng/mL) Amphetamine and metabolites    1000 Barbiturate and metabolites    200 Benzodiazepine                 200 Opiates and metabolites        300 Cocaine and metabolites        300 THC                            50 Performed at [facility], [address], [place], [place] [address]    Preg Test, Ur 03/28/2023 NEGATIVE  NEGATIVE Final   Comment:        THE SENSITIVITY OF THIS METHODOLOGY IS >25 mIU/mL. Performed at [facility], [address], [place], [place] [address]    Specimen Description 03/28/2023    Final                   Value:URINE, RANDOM Performed at [facility], [address] [place], [place] [address]    Special Requests 03/28/2023    Final                   Value:NONE Reflexed from [id number] Performed at [facility], [address], [place], [place] [address]    Culture 03/28/2023 >=100,000 COLONIES/mL PROTEUS MIRABILIS (A)   Final   Report Status 03/28/2023 03/30/2023 FINAL   Final   Organism ID, Bacteria 03/28/2023 PROTEUS MIRABILIS (A)   Final   Specimen Description 03/28/2023    Final                   Value:BLOOD RIGHT ANTECUBITAL Performed at [facility], [address] [place], [place] [address]    Special Requests 03/28/2023    Final                   Value:BOTTLES DRAWN AEROBIC AND ANAEROBIC Blood Culture adequate volume Performed at [facility], [address]  [place], [place] [address]    Culture 03/28/2023    Final                   Value:NO GROWTH 5 DAYS Performed at [facility], [address], [place], [place] [address]    Report Status 03/28/2023 04/02/2023 FINAL   Final   Specimen Description 03/28/2023    Final                   Value:BLOOD LEFT ARM Performed at [facility], [address] [place], [place] [address]    Special Requests 03/28/2023    Final                   Value:BOTTLES DRAWN AEROBIC AND ANAEROBIC Blood Culture results may not be optimal due to an inadequate volume of blood received in culture bottles Performed at [facility], [address] [place], [place] [address]    Culture 03/28/2023    Final  Value:NO GROWTH 5 DAYS Performed at [facility], [address], [place], [place] [address]    Report Status 03/28/2023 04/02/2023 FINAL   Final   Lactic Acid, Venous 03/28/2023 2.8 (HH)  0.5 - 1.9 mmol/L Final   Comment 03/28/2023 NOTIFIED PHYSICIAN   Final   Troponin I (High Sensitivity) 03/28/2023 <2  <18 ng/L Final   Comment: (NOTE) Elevated high sensitivity troponin I (hsTnI) values and significant  changes across serial measurements may suggest ACS but many other  chronic and acute conditions are known to elevate hsTnI results.  Refer to the "Links" section for chest pain algorithms and additional  guidance. Performed at [facility], [address], [place], [place] [address]    Lactic Acid, Venous 03/28/2023 1.0  0.5 - 1.9 mmol/L Final    Blood Alcohol level:  Lab Results  Component Value Date   ETH <10 05/17/2023   ETH <10 03/29/2023    Metabolic Disorder Labs: Lab Results  Component Value Date   HGBA1C 5.2 05/17/2023   MPG 102.54 05/17/2023   MPG 108.28 06/25/2021   No results found for: "PROLACTIN" Lab Results  Component Value Date   CHOL 142 05/17/2023   TRIG 237 (H) 05/17/2023   HDL 59 05/17/2023   CHOLHDL 2.4  05/17/2023   VLDL 47 (H) 05/17/2023   LDLCALC 36 05/17/2023    Therapeutic Lab Levels: No results found for: "LITHIUM" No results found for: "VALPROATE" No results found for: "CBMZ"  Physical Findings   AIMS    Flowsheet Row Admission (Discharged) from 02/22/2018 in [facility]  AIMS Total Score 0      AUDIT    Flowsheet Row Admission (Discharged) from 02/22/2018 in [facility] INPATIENT BEHAVIORAL MEDICINE  Alcohol Use Disorder Identification Test Final Score (AUDIT) 8      PHQ2-9    Flowsheet Row ED from 03/29/2023 in [facility]  PHQ-2 Total Score 1  PHQ-9 Total Score 4      Flowsheet Row ED from 05/18/2023 in [facility] Emergency Department at [facility] Most recent reading at 05/18/2023  8:07 AM ED from 05/17/2023 in [facility] Most recent reading at 05/17/2023 11:29 PM ED from 05/17/2023 in [facility] Most recent reading at 05/17/2023  3:38 PM  C-SSRS RISK CATEGORY No Risk No Risk Error: Q3, 4, or 5 should not be populated when Q2 is No        Musculoskeletal  Strength & Muscle Tone: within normal limited Gait & Station: normal Patient leans: NA  Psychiatric Specialty Exam  Presentation General Appearance:Disheveled (in bed, under blanket, more engaged and pleasant today) Eye Contact:Fair Speech:Clear and Coherent, Normal Rate (spontaneous) Volume:Decreased Handedness:Right  Mood and Affect  Mood:Anxious (mainly somatic sxs of anxiety, less cognitive sxs) Affect: Anxious and mood congruent  Thought Process  Thought Process:Coherent, Goal Directed, Linear Descriptions of Associations:Intact  Thought Content Suicidal Thoughts:No Homicidal Thoughts:No Hallucinations:None Ideas of Reference:None Thought Content:WDL  Sensorium  Memory:Immediate Fair Judgment: Poor Insight:Shallow  Executive Functions  Orientation:Full (Time, Place and  Person) Language:Good Concentration:Good Attention:Good Recall:Good Fund of Knowledge:Good  Psychomotor Activity  Psychomotor Activity: Decreased Assets  Assets:Communication Skills, Desire for Improvement, Resilience  Sleep  Quality:Poor (better, but still having difficulties with staying and falling asleep) Physical Exam  Physical Exam Vitals and nursing note reviewed.  Constitutional:      General: She is not in acute distress.    Appearance: She is  ill-appearing. She is not toxic-appearing or diaphoretic.  HENT:     Head: Normocephalic and atraumatic.  Eyes:     Conjunctiva/sclera: Conjunctivae normal.  Pulmonary:     Effort: Pulmonary effort is normal. No respiratory distress.  Neurological:     General: No focal deficit present.     Mental Status: She is alert and oriented to person, place, and time.     Gait: Gait normal.    Blood pressure 106/71, pulse 76, temperature 98.5 F (36.9 C), temperature source Oral, resp. rate 18, SpO2 99%. There is no height or weight on file to calculate BMI.  Treatment Plan Summary: Daily contact with patient to assess and evaluate symptoms and progress in treatment and Medication management Principal Problem:   Polysubstance abuse (HCC) Active Problems:   Homelessness   Cocaine use disorder, severe, dependence (HCC)   Major depressive disorder, single episode, severe without psychotic features (HCC)   HO Drug overdose, accidental or unintentional   Methamphetamine dependence (HCC)   Sedative or hypnotic abuse (HCC)   Cocaine abuse (HCC)   Psychoactive substance-induced insomnia (HCC)   History of admission to inpatient psychiatry department   History of suicide attempt  Still suffering through withdrawals and cravings, although she looks better than yesterday. BP is normotensive, will increase it per below.  Has issues with anxiety and appetite, despite being on zyprexa 10 mg, starting her on remeron per below.  OUD,  severe Denied ever IVDU COWS with clonidine and comfort PRNs per protocol Continued clonidine taper (ends 4/11)  Stimulant use d/o Cannabis use d/o Denied ever IVDU Encouraged cessation Comfort PRNs   Substance-induced mood d/o  Substance-induced insomnia Continued home zyprexa 10 mg qPM Continue on Remeron  trazodone 50 mg at bedtime for insomnia  PRNs: acetaminophen, 650 mg, Q6H PRN alum & mag hydroxide-simeth, 30 mL, Q4H PRN dicyclomine, 20 mg, Q6H PRN haloperidol lactate, 5 mg, TID PRN  And diphenhydrAMINE, 50 mg, TID PRN  And LORazepam, 2 mg, TID PRN hydrOXYzine, 25 mg, TID PRN loperamide, 2-4 mg, PRN magnesium hydroxide, 30 mL, Daily PRN methocarbamol, 500 mg, Q8H PRN naproxen, 500 mg, BID PRN ondansetron, 4 mg, Q6H PRN       Abnormal Labs Reviewed - No data to display   DISPO:  Tentative date: 77, ~4/11 Location: interested in rehab, [facility] Follow-up/Recs: PCP - health maintenance    Lewanda Rife, MD

## 2023-05-23 ENCOUNTER — Other Ambulatory Visit (HOSPITAL_COMMUNITY): Payer: Self-pay

## 2023-05-23 DIAGNOSIS — F1994 Other psychoactive substance use, unspecified with psychoactive substance-induced mood disorder: Secondary | ICD-10-CM | POA: Diagnosis not present

## 2023-05-23 DIAGNOSIS — F19982 Other psychoactive substance use, unspecified with psychoactive substance-induced sleep disorder: Secondary | ICD-10-CM | POA: Diagnosis not present

## 2023-05-23 DIAGNOSIS — F1520 Other stimulant dependence, uncomplicated: Secondary | ICD-10-CM | POA: Diagnosis not present

## 2023-05-23 DIAGNOSIS — F1423 Cocaine dependence with withdrawal: Secondary | ICD-10-CM | POA: Diagnosis not present

## 2023-05-23 MED ORDER — MIRTAZAPINE 15 MG PO TABS: 15.0000 mg | ORAL_TABLET | Freq: Every day | ORAL | 0 refills | Status: DC

## 2023-05-23 MED ORDER — NICOTINE POLACRILEX 2 MG MT GUM: 2.0000 mg | CHEWING_GUM | OROMUCOSAL | 0 refills | Status: DC

## 2023-05-23 MED ORDER — MIRTAZAPINE 15 MG PO TABS
15.0000 mg | ORAL_TABLET | Freq: Every day | ORAL | 0 refills | Status: DC
  Filled 2023-05-23: qty 30, 30d supply, fill #0

## 2023-05-23 MED ORDER — TRAZODONE HCL 50 MG PO TABS
50.0000 mg | ORAL_TABLET | Freq: Every day | ORAL | 0 refills | Status: DC
  Filled 2023-05-23: qty 30, 30d supply, fill #0

## 2023-05-23 MED ORDER — OLANZAPINE 10 MG PO TABS: 10.0000 mg | ORAL_TABLET | Freq: Every day | ORAL | 0 refills | Status: DC

## 2023-05-23 MED ORDER — NAPROXEN 500 MG PO TABS: 500.0000 mg | ORAL_TABLET | Freq: Two times a day (BID) | ORAL | 0 refills | Status: DC | PRN

## 2023-05-23 MED ORDER — HYDROXYZINE HCL 25 MG PO TABS: 25.0000 mg | ORAL_TABLET | Freq: Three times a day (TID) | ORAL | 0 refills | Status: DC | PRN

## 2023-05-23 MED ORDER — HYDROXYZINE HCL 25 MG PO TABS
25.0000 mg | ORAL_TABLET | Freq: Three times a day (TID) | ORAL | 0 refills | Status: DC | PRN
  Filled 2023-05-23: qty 30, 10d supply, fill #0

## 2023-05-23 MED ORDER — NICOTINE POLACRILEX 2 MG MT GUM
2.0000 mg | CHEWING_GUM | OROMUCOSAL | 0 refills | Status: DC
  Filled 2023-05-23: qty 110, 22d supply, fill #0

## 2023-05-23 MED ORDER — NAPROXEN 500 MG PO TABS
500.0000 mg | ORAL_TABLET | Freq: Two times a day (BID) | ORAL | 0 refills | Status: DC | PRN
  Filled 2023-05-23: qty 20, 10d supply, fill #0

## 2023-05-23 MED ORDER — TRAZODONE HCL 50 MG PO TABS: 50.0000 mg | ORAL_TABLET | Freq: Every day | ORAL | 0 refills | Status: DC

## 2023-05-23 MED ORDER — OLANZAPINE 10 MG PO TABS
10.0000 mg | ORAL_TABLET | Freq: Every day | ORAL | 0 refills | Status: DC
  Filled 2023-05-23: qty 30, 30d supply, fill #0

## 2023-05-23 NOTE — ED Notes (Signed)
 Patient A&Ox4. Denies intent to harm self/others when asked. Denies A/VH. Patient denies any physical complaints when asked. No acute distress noted. Pt refused her nicotine gum this morning stating it makes her mouth dry and keep her from sleeping. Provider made aware. Support and encouragement provided. Routine safety checks conducted according to facility protocol. Encouraged patient to notify staff if thoughts of harm toward self or others arise. Patient verbalize understanding and agreement. Will continue to monitor for safety.

## 2023-05-23 NOTE — Discharge Planning (Signed)
 Patient will be discharging back to her tent on today and with transportation provided by her friend. Patient aware that [facility] is available to her if she decides to present on tomorrow by 7:00am. Patient reports no interest at this time. LCSW to sign off.   Fernande Boyden, LCSW Clinical Social Worker [facility] Ph: [phone]
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
## 2023-05-23 NOTE — Group Note (Signed)
 Group Topic: Communication  Group Date: 05/23/2023 Start Time: 1030 End Time: 1035 Facilitators: Prentice Docker, RN  Department: [facility]  Number of Participants: 1  Group Focus: discharge education Treatment Modality:  Individual Therapy Interventions utilized were patient education Purpose: increase insight  Name: Cassidy Underwood [facility] Date of Birth: March 11, 1995  MR: [id number]    Level of Participation: active Quality of Participation: cooperative Interactions with others: gave feedback Mood/Affect: appropriate Triggers (if applicable): none identified Cognition: coherent/clear Progress: Gaining insight Response: pt verbalized understanding of discharge instructions reviewed on AVS and safety plan Plan: patient will be encouraged to seek help if relapse and continue learned coping skills to manage cravings  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   Polysubstance dependence (HCC) 05/18/2023   Recurrent major depressive disorder (HCC) 04/02/2023   Rhabdomyolysis 06/25/2021   HO Drug overdose, accidental or unintentional 06/25/2021   AKI (acute kidney injury) (HCC) 06/25/2021   Leukocytosis 06/25/2021   Elevated LFTs 06/25/2021   Hyperglycemia 06/25/2021   Hearing loss 06/25/2021   [redacted] weeks gestation of pregnancy 12/17/2018   Normal labor 12/16/2018   Substance induced mood disorder (HCC)    Cocaine use disorder, severe, dependence (HCC) 02/22/2018   Major depressive disorder, single episode, severe without psychotic features (HCC) 02/22/2018   Major depressive disorder, single episode, severe without psychosis (HCC) 02/22/2018    Indication for care in labor or delivery 11/13/2017   Pregnancy with adoption planned 10/05/2017   IUGR (intrauterine growth restriction) affecting care of mother 10/05/2017   Supervision of high risk pregnancy, antepartum 08/02/2017   Polysubstance abuse (HCC) 07/29/2017   Homelessness 11/02/2016

## 2023-05-23 NOTE — ED Notes (Signed)
 Patient is sleeping. Respirations equal and unlabored, skin warm and dry. No change in assessment or acuity. Routine safety checks conducted according to facility protocol. Will continue to monitor for safety.
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
## 2023-09-09 NOTE — ED Provider Notes (Signed)
 [facility]  ED Provider Note  Cassidy Underwood 28 y.o. female DOB: [birth year] MRN: [id number] History   Chief Complaint  Patient presents with  . Seizures: New Onset    Pt BIB EMS after a bystander called them as the pt was seen actively seizing (no hx of). Per EMS, pt had rhythmic eye movement and tremors. Pt reports smoking K2, use of cocaine, heroin, and fentanyl  today. EMS gave 5 mg of versed  in route and reports pt has been A&O x4 ever since.    Patient was found by bystander actively seizing called EMS  Patient states that she uses heroin cocaine fentanyl /did not have any of that so somebody rolled up some K2 and she is smoked it, becoming dizzy spinning sensation  No past medical history  Medicines, none  Allergies, none  Smoke tobacco yes alcohol no drugs as above  EMS provided Versed  5 mg IV       Past Medical History:  Diagnosis Date  . Anxiety   . Asthma (*)   . Urinary tract infection     History reviewed. No pertinent surgical history.  Social History   Substance and Sexual Activity  Alcohol Use No   Tobacco Use History[1] E-Cigarettes  . Vaping Use    . Start Date    . Cartridges/Day    . Quit Date     Social History   Substance and Sexual Activity  Drug Use No   Tetanus up to date?: Unknown Immunizations Up to Date?: Unknown   Allergies[2]  Current Discharge Medication List     CONTINUE these medications which have NOT CHANGED   Details  adapalene (DIFFERIN) 0.1% cream Apply topically at bedtime.    albuterol sulfate HFA (VENTOLIN HFA) 108 (90 BASE) MCG/ACT inhaler Inhale two puffs every four hours as needed for trouble breathing, wheezing, cough Qty: 1 Inhaler, Refills: 0    ARIPiprazole  (ABILIFY ) 10 mg tablet Take 10 mg by mouth daily.    clindamycin -benzoyl peroxide (BENZACLIN) gel Apply topically 2 (two) times daily.    Divalproex Sodium (DEPAKOTE PO) Take by mouth.    Etonogestrel  (IMPLANON ) 68 MG  IMPL Inject into the skin.    fluconazole (DIFLUCAN) 150 MG tablet Take 1 tablet by mouth now, then repeat dose in 4 days. Qty: 2 tablet, Refills: 0   Associated Diagnoses: Yeast infection    fluticasone -salmeterol (ADVAIR HFA) 115-21 MCG/ACT inhaler Inhale 2 puffs into the lungs 2 (two) times daily.        Primary Survey  Primary Survey  Review of Systems   Review of Systems  Respiratory:  Negative for shortness of breath.   Cardiovascular:  Negative for chest pain.  Gastrointestinal:  Negative for abdominal pain, nausea and vomiting.  Neurological:  Positive for seizures.  All other systems reviewed and are negative.   Physical Exam   ED Triage Vitals [09/09/23 0225]  BP (!) 111/57  Heart Rate 90  Resp 18  SpO2 96 %  Temp 98.3 F (36.8 C)    Physical Exam  Nursing note and vitals reviewed. Constitutional: She appears well-developed. She does not appear distressed.  HENT:  Head: Normocephalic and atraumatic.  Mouth/Throat: Voice normal.  Eyes: EOM are intact. Pupils are equal, round, and reactive to light.  Neck: Normal range of motion and voice normal.  Cardiovascular: Normal rate and regular rhythm.  Pulmonary/Chest: Respiratory effort normal and breath sounds normal.  Abdominal: Soft.  Musculoskeletal: Normal range of motion.  Cervical back: Normal range of motion.   Neurological: She is alert and oriented to person, place, and time.  Skin: Skin is warm.  Psychiatric: She has a normal mood and affect.     ED Course   Lab results:   CBC AND DIFFERENTIAL - Abnormal      Result Value   WBC 8.9     RBC 4.57     HGB 13.5     HCT 40.0     MCV 87.5     MCH 29.5     MCHC 33.8     Plt Ct 317     RDW SD 38.7     MPV 9.1 (*)    NRBC% 0.0     Absolute NRBC Count 0.00     NEUTROPHIL % 63.8     LYMPHOCYTE % 30.3     MONOCYTE % 3.2     Eosinophil % 2.0     BASOPHIL % 0.2     IG% 0.5     ABSOLUTE NEUTROPHIL COUNT 5.67     ABSOLUTE LYMPHOCYTE  COUNT 2.69     Absolute Monocyte Count 0.28     Absolute Eosinophil Count 0.18     Absolute Basophil Count 0.02     Absolute Immature Granulocyte Count 0.04 (*)   COMPREHENSIVE METABOLIC PANEL - Abnormal   Na 140     Potassium 3.7     Cl 104     CO2 25     AGAP 11     Glucose 126 (*)    BUN 12     Creatinine 0.60     Ca 9.8     ALK PHOS 65     T Bili 0.5     Total Protein 7.5     Alb 4.2     GLOBULIN 3.3     ALBUMIN/GLOBULIN RATIO 1.3     BUN/CREAT RATIO 20.0     ALT 16     AST 23     eGFR 126     Comment: Normal GFR (glomerular filtration rate) > 60 mL/min/1.73 meters squared, < 60 may include impaired kidney function. Calculation based on the Chronic Kidney Disease Epidemiology Collaboration (CK-EPI)equation refit without adjustment for race.  URINALYSIS ONLY - NO SYMPTOMS - Abnormal   Urine Color Yellow     Urine Clarity Clear     Urine Specific Gravity 1.030     Urine pH 6.5     Urine Protein - Dipstick 10 (*)    Urine Glucose Negative     Urine Ketones Negative     Urine Bilirubin Negative     Urine Blood Negative     Urine Nitrite Negative     Urine Urobilinogen 3 (*)    Urine Leukocyte Esterase Negative     UA Microscopic No Micro    URINE DRUGS OF ABUSE SCRN - Abnormal   Ur PH DOA Scr 6.5     Amphet Scr Negative     Barb Scr Negative     Benzo Scr Positive (*)    Cannab Scr Negative     Cocaine Scr Positive (*)    Opiates Scr Positive (*)    Meth Scr Negative     Oxyco Scr Negative     Fentanyl  Scr Positive (*)    Buprenorphine  Screen Negative     Narrative:    Please Note Detection Levels Below:  Amphetamines                    1000 ng/mL  Barbiturates                    200 ng/mL  Benzodiazepines                 200 ng/mL  Cannabinoids (Marijuana, THC)   50 ng/mL  Cocaine                         300 ng/mL  Opiates                         300 ng/mL  Methadone                       300 ng/mL  Oxycodone                         100 ng/mL  Fentanyl                           5 ng/mL  Buprenorphine                      5 ng/mL  This test is a screening test and results are only to be used for medical purposes.  If confirmation of positive results are needed, please order confirmation by GC/MS for each drug that needs confirmation.  Urine specimens are retained for 5 days.   MAGNESIUM  - Normal   Mg 2.2    ETHANOL - Normal   Ethanol <10     Comment: Blood Alcohol Level is for Medical Purposes Only.  CK - Normal   CK 147    HUMAN CHORIONIC GONADOTROPIN  (HCG), BETA-SUBUNIT, QUALITATIVE - Normal   Beta HCG Qual Negative      Imaging:   CT HEAD WO CONTRAST   Narrative:    INDICATION:    Seizure,  COMPARISON:   None.  TECHNIQUE:    Multiple axial images obtained from the skull base to the vertex without IV contrast  were obtained on  09/09/2023 5:27 AM  FINDINGS:  No evidence of hydrocephalus. No intracranial mass, mass effect or midline shift. No acute infarction evident. No intracranial hemorrhage. Paranasal sinuses: Clear. Mastoid air cells: Clear. Calvarium: Intact.    Impression:    IMPRESSION:   No acute intracranial abnormality.  Electronically Signed by: Marinda Fleming, MD on 09/09/2023 6:12 AM      ECG: ECG Results   None                                     Clinical Opiate Withdrawal Scale Resting Pulse Rate: Measured After Patient is Sitting or Lying for One Minute: Pulse rate 81-100 GI Upset: Over Last Half Hour: Nausea or loose stool Sweating: Over Past Half Hour Not Accounted for by Room Temperature or Patient Activity: Subjective report of chills or flushing Tremor: Observation of Outstretched Hands: Slight tremor observable Restlessness: Observation During Assessment: Reports difficulty sitting still, but is able to do so Yawning: Observation During Assessment: No yawning Pupil Size: Pupils pinned or normal size for room light Anxiety and  Irritability: Patient reports increasing irritability or anxiousness Bone or Joint Aches: If Patient was Having  Pain Previously, Only the Additional Component Attributed to Opiate Withdrawal is Scored: Not present Gooseflesh Skin: Skin is smooth Runny Nose or Tearing: Not Accounted for by Cold Symptoms or Allergies: Nose running or tearing Clinical Opiate Withdrawal Scale Total Score: 10                             Pre-Sedation Procedures    Medical Decision Making Patient was found on the road having seizures states that she is an opiate addicted and had no access to heroin so she smoked some chemicals not sure what they were  Patient offers no other complaints  Laboratory studies unremarkable except for polysubstance abuse and drug screen Head CT negative  Patient's asking for opiate withdrawal medicines will provide clonidine  Ask her to see neurologist  Amount and/or Complexity of Data Reviewed Labs: ordered. Radiology: ordered.  Risk Prescription drug management.            Provider Communication  Current Discharge Medication List     START taking these medications   Details  cloNIDine  (CATAPRES ) 0.2 mg tablet Take one tablet (0.2 mg dose) by mouth every 6 (six) hours as needed. Qty: 40 tablet, Refills: 0        Current Discharge Medication List      Current Discharge Medication List      Clinical Impression Final diagnoses:  Seizure-like activity (*)  Polysubstance abuse (*)    ED Disposition     ED Disposition  Discharge   Condition  Stable   Comment  --                 Follow-up Information     Rafal CHRISTELLA Levin, MD.   Specialty: Neurology Contact information: [address] [place] [address] [phone]                  Electronically signed by:       [1] Social History Tobacco Use  Smoking Status Never  Smokeless Tobacco Never  [2] No Known Allergies  Lynwood JAYSON Life, MD 09/09/23 [phone]

## 2023-10-24 ENCOUNTER — Other Ambulatory Visit (HOSPITAL_COMMUNITY)
Admission: EM | Admit: 2023-10-24 | Discharge: 2023-10-24 | Disposition: A | Discharge status: Other Acute Inpatient Hospital | Attending: Psychiatry | Admitting: Psychiatry

## 2023-10-24 ENCOUNTER — Other Ambulatory Visit (HOSPITAL_COMMUNITY)
Admission: EM | Admit: 2023-10-24 | Discharge: 2023-10-29 | Disposition: A | Discharge status: Home / Self Care | Source: Intra-hospital | Attending: Psychiatry | Admitting: Psychiatry

## 2023-10-24 DIAGNOSIS — Z5900 Homelessness unspecified: Secondary | ICD-10-CM | POA: Insufficient documentation

## 2023-10-24 DIAGNOSIS — G4709 Other insomnia: Secondary | ICD-10-CM | POA: Insufficient documentation

## 2023-10-24 DIAGNOSIS — F1193 Opioid use, unspecified with withdrawal: Secondary | ICD-10-CM | POA: Insufficient documentation

## 2023-10-24 DIAGNOSIS — F4310 Post-traumatic stress disorder, unspecified: Secondary | ICD-10-CM | POA: Diagnosis not present

## 2023-10-24 DIAGNOSIS — F1124 Opioid dependence with opioid-induced mood disorder: Secondary | ICD-10-CM | POA: Diagnosis not present

## 2023-10-24 DIAGNOSIS — F1994 Other psychoactive substance use, unspecified with psychoactive substance-induced mood disorder: Secondary | ICD-10-CM | POA: Diagnosis present

## 2023-10-24 DIAGNOSIS — F17210 Nicotine dependence, cigarettes, uncomplicated: Secondary | ICD-10-CM | POA: Diagnosis not present

## 2023-10-24 DIAGNOSIS — Z560 Unemployment, unspecified: Secondary | ICD-10-CM | POA: Insufficient documentation

## 2023-10-24 DIAGNOSIS — F319 Bipolar disorder, unspecified: Secondary | ICD-10-CM | POA: Diagnosis not present

## 2023-10-24 DIAGNOSIS — F1129 Opioid dependence with unspecified opioid-induced disorder: Secondary | ICD-10-CM

## 2023-10-24 DIAGNOSIS — F1410 Cocaine abuse, uncomplicated: Secondary | ICD-10-CM | POA: Insufficient documentation

## 2023-10-24 DIAGNOSIS — Z79899 Other long term (current) drug therapy: Secondary | ICD-10-CM | POA: Insufficient documentation

## 2023-10-24 DIAGNOSIS — F1510 Other stimulant abuse, uncomplicated: Secondary | ICD-10-CM

## 2023-10-24 DIAGNOSIS — F1910 Other psychoactive substance abuse, uncomplicated: Secondary | ICD-10-CM | POA: Insufficient documentation

## 2023-10-24 LAB — HEMOGLOBIN A1C
Hgb A1c MFr Bld: 5.2 % (ref 4.8–5.6)
Mean Plasma Glucose: 102.54 mg/dL

## 2023-10-24 LAB — COMPREHENSIVE METABOLIC PANEL WITH GFR
ALT: 14 U/L (ref 0–44)
AST: 19 U/L (ref 15–41)
Albumin: 3.3 g/dL — ABNORMAL LOW (ref 3.5–5.0)
Alkaline Phosphatase: 45 U/L (ref 38–126)
Anion gap: 8 (ref 5–15)
BUN: 8 mg/dL (ref 6–20)
CO2: 29 mmol/L (ref 22–32)
Calcium: 8.9 mg/dL (ref 8.9–10.3)
Chloride: 102 mmol/L (ref 98–111)
Creatinine, Ser: 0.66 mg/dL (ref 0.44–1.00)
GFR, Estimated: >60 mL/min (ref >60)
Glucose, Bld: 72 mg/dL (ref 70–99)
Potassium: 3.7 mmol/L (ref 3.5–5.1)
Sodium: 139 mmol/L (ref 135–145)
Total Bilirubin: 0.5 mg/dL (ref 0.0–1.2)
Total Protein: 5.7 g/dL — ABNORMAL LOW (ref 6.5–8.1)

## 2023-10-24 LAB — URINALYSIS, ROUTINE W REFLEX MICROSCOPIC
Bacteria, UA: NONE SEEN
Bilirubin Urine: NEGATIVE
Glucose, UA: NEGATIVE mg/dL
Ketones, ur: NEGATIVE mg/dL
Leukocytes,Ua: NEGATIVE
Nitrite: NEGATIVE
Protein, ur: NEGATIVE mg/dL
Specific Gravity, Urine: 1.024 (ref 1.005–1.030)
pH: 5 (ref 5.0–8.0)

## 2023-10-24 LAB — CBC WITH DIFFERENTIAL/PLATELET
Abs Immature Granulocytes: 0.03 K/uL (ref 0.00–0.07)
Basophils Absolute: 0 K/uL (ref 0.0–0.1)
Basophils Relative: 0 %
Eosinophils Absolute: 0.3 K/uL (ref 0.0–0.5)
Eosinophils Relative: 3 %
HCT: 37.3 % (ref 36.0–46.0)
Hemoglobin: 12.4 g/dL (ref 12.0–15.0)
Immature Granulocytes: 0 %
Lymphocytes Relative: 42 %
Lymphs Abs: 3.7 K/uL (ref 0.7–4.0)
MCH: 29 pg (ref 26.0–34.0)
MCHC: 33.2 g/dL (ref 30.0–36.0)
MCV: 87.4 fL (ref 80.0–100.0)
Monocytes Absolute: 0.5 K/uL (ref 0.1–1.0)
Monocytes Relative: 5 %
Neutro Abs: 4.4 K/uL (ref 1.7–7.7)
Neutrophils Relative %: 50 %
Platelets: 330 K/uL (ref 150–400)
RBC: 4.27 MIL/uL (ref 3.87–5.11)
RDW: 11.9 % (ref 11.5–15.5)
WBC: 8.9 K/uL (ref 4.0–10.5)
nRBC: 0 % (ref 0.0–0.2)

## 2023-10-24 LAB — POCT URINE DRUG SCREEN - MANUAL ENTRY (I-SCREEN)
POC Amphetamine UR: NOT DETECTED
POC Buprenorphine (BUP): NOT DETECTED
POC Cocaine UR: POSITIVE — AB
POC Marijuana UR: NOT DETECTED
POC Methadone UR: NOT DETECTED
POC Methamphetamine UR: POSITIVE — AB
POC Morphine: POSITIVE — AB
POC Oxazepam (BZO): NOT DETECTED
POC Oxycodone UR: NOT DETECTED
POC Secobarbital (BAR): NOT DETECTED

## 2023-10-24 LAB — VITAMIN B12: Vitamin B-12: 230 pg/mL (ref 180–914)

## 2023-10-24 LAB — ETHANOL: Alcohol, Ethyl (B): <15 mg/dL (ref <15)

## 2023-10-24 LAB — LIPID PANEL
Cholesterol: 110 mg/dL (ref 0–200)
HDL: 48 mg/dL (ref >40)
LDL Cholesterol: 54 mg/dL (ref 0–99)
Total CHOL/HDL Ratio: 2.3 ratio
Triglycerides: 39 mg/dL (ref <150)
VLDL: 8 mg/dL (ref 0–40)

## 2023-10-24 LAB — TSH: TSH: 1.765 u[IU]/mL (ref 0.350–4.500)

## 2023-10-24 LAB — MAGNESIUM: Magnesium: 1.9 mg/dL (ref 1.7–2.4)

## 2023-10-24 LAB — POC URINE PREG, ED: Preg Test, Ur: NEGATIVE

## 2023-10-24 LAB — VITAMIN D 25 HYDROXY (VIT D DEFICIENCY, FRACTURES): Vit D, 25-Hydroxy: 45.29 ng/mL (ref 30–100)

## 2023-10-24 MED ORDER — MAGNESIUM HYDROXIDE 400 MG/5ML PO SUSP: 30.0000 mL | Freq: Every day | ORAL | Status: DC | PRN

## 2023-10-24 MED ORDER — HALOPERIDOL LACTATE 5 MG/ML IJ SOLN: 5.0000 mg | Freq: Three times a day (TID) | INTRAMUSCULAR | Status: DC | PRN

## 2023-10-24 MED ORDER — ALUM & MAG HYDROXIDE-SIMETH 200-200-20 MG/5ML PO SUSP: 30.0000 mL | ORAL | Status: DC | PRN

## 2023-10-24 MED ORDER — DIPHENHYDRAMINE HCL 50 MG/ML IJ SOLN: 50.0000 mg | Freq: Three times a day (TID) | INTRAMUSCULAR | Status: DC | PRN

## 2023-10-24 MED ORDER — LORAZEPAM 2 MG/ML IJ SOLN: 2.0000 mg | Freq: Three times a day (TID) | INTRAMUSCULAR | Status: DC | PRN

## 2023-10-24 MED ORDER — TRAZODONE HCL 50 MG PO TABS: 50.0000 mg | ORAL_TABLET | Freq: Every evening | ORAL | Status: DC | PRN

## 2023-10-24 MED ORDER — DIPHENHYDRAMINE HCL 50 MG PO CAPS: 50.0000 mg | ORAL_CAPSULE | Freq: Three times a day (TID) | ORAL | Status: DC | PRN

## 2023-10-24 MED ORDER — CLONIDINE HCL 0.1 MG PO TABS
0.1000 mg | ORAL_TABLET | Freq: Four times a day (QID) | ORAL | Status: DC
  Administered 2023-10-24 – 2023-10-25 (×3): 0.1 mg via ORAL
  Filled 2023-10-24 (×4): qty 1

## 2023-10-24 MED ORDER — CLONIDINE HCL 0.1 MG PO TABS: 0.1000 mg | ORAL_TABLET | Freq: Every day | ORAL | Status: DC

## 2023-10-24 MED ORDER — METHOCARBAMOL 500 MG PO TABS: 500.0000 mg | ORAL_TABLET | Freq: Three times a day (TID) | ORAL | Status: DC | PRN

## 2023-10-24 MED ORDER — ACETAMINOPHEN 325 MG PO TABS: 650.0000 mg | ORAL_TABLET | Freq: Four times a day (QID) | ORAL | Status: DC | PRN

## 2023-10-24 MED ORDER — HYDROXYZINE HCL 25 MG PO TABS
25.0000 mg | ORAL_TABLET | Freq: Three times a day (TID) | ORAL | Status: DC | PRN
  Administered 2023-10-24 – 2023-10-28 (×8): 25 mg via ORAL
  Filled 2023-10-24 (×8): qty 1

## 2023-10-24 MED ORDER — LOPERAMIDE HCL 2 MG PO CAPS: 2.0000 mg | ORAL_CAPSULE | ORAL | Status: DC | PRN

## 2023-10-24 MED ORDER — METHOCARBAMOL 500 MG PO TABS
500.0000 mg | ORAL_TABLET | Freq: Three times a day (TID) | ORAL | Status: DC | PRN
  Administered 2023-10-26 (×2): 500 mg via ORAL
  Filled 2023-10-24 (×2): qty 1

## 2023-10-24 MED ORDER — DICYCLOMINE HCL 20 MG PO TABS: 20.0000 mg | ORAL_TABLET | Freq: Four times a day (QID) | ORAL | Status: DC | PRN

## 2023-10-24 MED ORDER — NAPROXEN 500 MG PO TABS: 500.0000 mg | ORAL_TABLET | Freq: Two times a day (BID) | ORAL | Status: DC | PRN

## 2023-10-24 MED ORDER — HALOPERIDOL 5 MG PO TABS: 5.0000 mg | ORAL_TABLET | Freq: Three times a day (TID) | ORAL | Status: DC | PRN

## 2023-10-24 MED ORDER — NICOTINE 21 MG/24HR TD PT24: 21.0000 mg | MEDICATED_PATCH | Freq: Every day | TRANSDERMAL | Status: DC

## 2023-10-24 MED ORDER — CLONIDINE HCL 0.1 MG PO TABS
0.1000 mg | ORAL_TABLET | ORAL | Status: DC
  Administered 2023-10-26: 0.1 mg via ORAL
  Filled 2023-10-24: qty 1

## 2023-10-24 MED ORDER — HYDROXYZINE HCL 25 MG PO TABS: 25.0000 mg | ORAL_TABLET | Freq: Three times a day (TID) | ORAL | Status: DC | PRN

## 2023-10-24 MED ORDER — CLONIDINE HCL 0.1 MG PO TABS: 0.1000 mg | ORAL_TABLET | Freq: Four times a day (QID) | ORAL | Status: DC

## 2023-10-24 MED ORDER — DICYCLOMINE HCL 20 MG PO TABS
20.0000 mg | ORAL_TABLET | Freq: Four times a day (QID) | ORAL | Status: DC | PRN
  Administered 2023-10-24 – 2023-10-25 (×3): 20 mg via ORAL
  Filled 2023-10-24 (×3): qty 1

## 2023-10-24 MED ORDER — HALOPERIDOL 5 MG PO TABS
5.0000 mg | ORAL_TABLET | Freq: Three times a day (TID) | ORAL | Status: DC | PRN
  Administered 2023-10-25 – 2023-10-27 (×2): 5 mg via ORAL
  Filled 2023-10-24 (×2): qty 1

## 2023-10-24 MED ORDER — HALOPERIDOL LACTATE 5 MG/ML IJ SOLN: 10.0000 mg | Freq: Three times a day (TID) | INTRAMUSCULAR | Status: DC | PRN

## 2023-10-24 MED ORDER — CLONIDINE HCL 0.1 MG PO TABS: 0.1000 mg | ORAL_TABLET | ORAL | Status: DC

## 2023-10-24 MED ORDER — ACETAMINOPHEN 325 MG PO TABS
650.0000 mg | ORAL_TABLET | Freq: Four times a day (QID) | ORAL | Status: DC | PRN
  Administered 2023-10-25: 650 mg via ORAL
  Filled 2023-10-24: qty 2

## 2023-10-24 MED ORDER — ONDANSETRON 4 MG PO TBDP: 4.0000 mg | ORAL_TABLET | Freq: Four times a day (QID) | ORAL | Status: DC | PRN

## 2023-10-24 MED ORDER — TRAZODONE HCL 50 MG PO TABS
50.0000 mg | ORAL_TABLET | Freq: Every evening | ORAL | Status: DC | PRN
  Administered 2023-10-24: 50 mg via ORAL
  Filled 2023-10-24: qty 1

## 2023-10-24 MED ORDER — DIPHENHYDRAMINE HCL 50 MG PO CAPS
50.0000 mg | ORAL_CAPSULE | Freq: Three times a day (TID) | ORAL | Status: DC | PRN
  Administered 2023-10-25 – 2023-10-27 (×2): 50 mg via ORAL
  Filled 2023-10-24 (×2): qty 1

## 2023-10-24 MED ORDER — NAPROXEN 500 MG PO TABS
500.0000 mg | ORAL_TABLET | Freq: Two times a day (BID) | ORAL | Status: DC | PRN
  Administered 2023-10-25 – 2023-10-26 (×3): 500 mg via ORAL
  Filled 2023-10-24 (×3): qty 1

## 2023-10-24 MED ORDER — NICOTINE 21 MG/24HR TD PT24
21.0000 mg | MEDICATED_PATCH | Freq: Every day | TRANSDERMAL | Status: DC
  Administered 2023-10-26 – 2023-10-28 (×3): 21 mg via TRANSDERMAL
  Filled 2023-10-24 (×5): qty 1

## 2023-10-24 MED ORDER — ONDANSETRON 4 MG PO TBDP
4.0000 mg | ORAL_TABLET | Freq: Four times a day (QID) | ORAL | Status: DC | PRN
  Filled 2023-10-24: qty 1
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
## 2023-10-24 NOTE — ED Notes (Signed)
 Pt admitted voluntarily to [facility] requesting detox from opioids and cocaine. Pt endorses using heroin earlier today prior to coming to facility. Pt states, I want to leave on Friday. I can't stay longer than that. Pt signed 72-hr form to discharge. Pt denies SI/HI/AVH. Calm, cooperative throughout interview process. Skin assessment completed. Oriented to unit. Meal and drink offered. Pt verbally contract for safety. Will monitor for safety.

## 2023-10-24 NOTE — ED Notes (Signed)
 PRN Bentyl  given due to patient reports of abdominal cramping. Medication administered with no complications. Environment secured, safety checks in place per facility policy.
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
## 2023-10-24 NOTE — ED Notes (Signed)
 Pt sitting in dayroom eating dinner and watching television. No acute distress noted. No concerns voiced. Informed pt to notify staff with any needs or assistance. Pt verbalized understanding and agreement. Will continue to monitor for safety.

## 2023-10-24 NOTE — ED Notes (Addendum)
 Error in charting.

## 2023-10-24 NOTE — Care Management (Signed)
 [facility] Care Management   Writer met with the patient to discuss discharge planning.  Patient reports that she, only wants to stay long enough to stop using.  Patient reports that this should take 3 days.  Patient reports that she is working with another agency that will assist her with obtaining housing.    Patient declined inpatient substance abuse treatment.  Patient reports that she is willing  to participate in outpatient individual substance abuse therapy.

## 2023-10-24 NOTE — ED Notes (Signed)
 Patient is in the bed calm and sleeping. NAD.  Respirations even and unlabored. Will monitor for safety.
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
## 2023-10-24 NOTE — ED Notes (Signed)
Patient transferred to [facility] ?

## 2023-10-24 NOTE — Progress Notes (Signed)
 Pt has been accepted to [facility] on 10/24/2023 Bed assignment: 166  Pt meets inpatient criteria per: Donia Creighton NP  Attending Physician will be: Dr.Bethea MD  Report can be called to: [facility] WILL UPDATE   Pt can arrive after [facility] WILL UPDATE   Care Team Notified: [facility] Cherylynn Ernst RN, Donia Snell NP.   [place] Lorie Cleckley LCSW-A   10/24/2023 12:44 PM

## 2023-10-24 NOTE — ED Notes (Signed)
 Patient is in the bedroom calm and asleep. NAD. Will continue to monitor for safety.

## 2023-10-24 NOTE — Group Note (Signed)
 Group Topic: Overcoming Obstacles  Group Date: 10/24/2023 Start Time: 0815 End Time: 9152 Facilitators: Lonzell Dwayne RAMAN, NT  Department: [facility]  Number of Participants: 2  Group Focus: relapse prevention Treatment Modality:  Psychoeducation Interventions utilized were clarification Purpose: enhance coping skills  Name: Cassidy Underwood [facility] Date of Birth: 24-Feb-1995  MR: [id number]    Level of Participation: Patient did not attend group Quality of Participation: N/A Interactions with others: N/A Mood/Affect: N/A Triggers (if applicable): N/A Cognition: N/A Progress: N/A Response: N/A Plan: N/A  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   Polysubstance dependence (HCC) 05/18/2023   Recurrent major depressive disorder (HCC) 04/02/2023   Rhabdomyolysis 06/25/2021   HO Drug overdose, accidental or unintentional 06/25/2021   AKI (acute kidney injury) (HCC) 06/25/2021   Leukocytosis 06/25/2021   Elevated LFTs 06/25/2021   Hyperglycemia 06/25/2021   Hearing loss 06/25/2021   [redacted] weeks gestation of pregnancy 12/17/2018   Normal labor 12/16/2018   Substance induced mood disorder (HCC)    Cocaine use disorder, severe, dependence (HCC) 02/22/2018   Major depressive disorder, single episode, severe without psychotic features (HCC) 02/22/2018   Major depressive disorder, single episode, severe without psychosis (HCC) 02/22/2018   Indication for care in labor or delivery 11/13/2017   Pregnancy with adoption planned 10/05/2017   IUGR (intrauterine growth restriction) affecting care of mother 10/05/2017   Supervision of high risk pregnancy,  antepartum 08/02/2017   Polysubstance abuse (HCC) 07/29/2017   Homelessness 11/02/2016
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
## 2023-10-25 DIAGNOSIS — F319 Bipolar disorder, unspecified: Secondary | ICD-10-CM | POA: Diagnosis not present

## 2023-10-25 DIAGNOSIS — F17210 Nicotine dependence, cigarettes, uncomplicated: Secondary | ICD-10-CM | POA: Diagnosis not present

## 2023-10-25 DIAGNOSIS — F1124 Opioid dependence with opioid-induced mood disorder: Secondary | ICD-10-CM | POA: Diagnosis not present

## 2023-10-25 DIAGNOSIS — F4310 Post-traumatic stress disorder, unspecified: Secondary | ICD-10-CM | POA: Diagnosis not present

## 2023-10-25 MED ORDER — TRAZODONE HCL 100 MG PO TABS
100.0000 mg | ORAL_TABLET | Freq: Every evening | ORAL | Status: DC | PRN
  Administered 2023-10-26 – 2023-10-28 (×3): 100 mg via ORAL
  Filled 2023-10-25 (×4): qty 1

## 2023-10-25 NOTE — ED Notes (Addendum)
 Pt currently asleep in bed. Pt ate lunch, continues to report significant withdrawal symptoms, pt administered scheduled clonidine  and prns.  medications reviewed with pt. Pt experienced difficulty swallowing pills, was successful taking pills with juice.

## 2023-10-25 NOTE — ED Provider Notes (Addendum)
 Behavioral Health Progress Note  Date and Time: 10/25/2023 1:04 PM Name: Cassidy Underwood [facility] MRN:  [id number]  Subjective:  Marge Vandermeulen is a 28 year old female patient who was admitted to the [facility] on 10/24/2023 requesting detox from opioids. She was accompanied by her peer support specialist and case manager. Patient has a documented history of MDD, PTSD, ODD, substance-induced mood disorder, drug overdose, and polysubstance abuse. Patient reported abusing heroin for the past 2 years and reported using $80 daily, by snorting and smoking. She reported using crack/cocaine for several years. Urine drug screen on arrival was positive for cocaine, methamphetamine and morphine . BAL was negative.  On evaluation today, patient is observed lying down in bed in a fetal position wrapped up in a blanket. She states that she is not feeling well and that she is going through withdrawal. She reports withdrawal symptoms of feeling shaky, heart beating fast, anxious, hot and cold flashes, body aches and runny nose. Patient was encouraged to take clonidine  as prescribed to help reduce opioid withdrawal symptoms and PRN medications to reduce symptoms. She was also encouraged to increase fluids as clonidine  can decrease blood pressure. She denies depressive symptoms. She denies suicidal thoughts. She denies homicidal thoughts. She denies auditory or visual hallucinations. No paranoia. She reports poor sleep and states that she has difficulty falling asleep and staying asleep. She reports alright appetite. She denies medication side effects. She states that her discharge plan is to go to a hotel and that her caseworker pays for the hotel and that her caseworker is trying to get her into a long-term treatment facility. She gives permission for staff to contact her caseworker Ms. Lanning [phone]. This information was shared with the [facility] care manager and treatment  team.  Diagnosis:  Final diagnoses:  Polysubstance abuse (HCC)  Cocaine abuse (HCC)  Methamphetamine use (HCC)  Opioid use with withdrawal (HCC)    Total Time spent with patient: 30 minutes  Past Psychiatric History: Patient has a documented history of MDD, PTSD, ODD, substance-induced mood disorder, drug overdose, and polysubstance abuse.   Past Medical History: Per chart review, history of hearing loss, and elevated liver enzymes.  Family Psychiatric  History: Patient adopted and has no known history of biological parents/family.  Social History: Homelessness.  Adopted. Patient is unemployed and has no source of income. History of heroin, crack methamphetamine, and fentanyl  use.   Current Medications:  Current Facility-Administered Medications  Medication Dose Route Frequency Provider Last Rate Last Admin   acetaminophen  (TYLENOL ) tablet 650 mg  650 mg Oral Q6H PRN Tex Drilling, NP       alum & mag hydroxide-simeth (MAALOX/MYLANTA) 200-200-20 MG/5ML suspension 30 mL  30 mL Oral Q4H PRN Tex Drilling, NP       cloNIDine  (CATAPRES ) tablet 0.1 mg  0.1 mg Oral QID Tex Drilling, NP   0.1 mg at 10/25/23 1058   Followed by   NOREEN ON 10/26/2023] cloNIDine  (CATAPRES ) tablet 0.1 mg  0.1 mg Oral BH-qamhs Nkwenti, Doris, NP       Followed by   NOREEN ON 10/28/2023] cloNIDine  (CATAPRES ) tablet 0.1 mg  0.1 mg Oral QAC breakfast Nkwenti, Doris, NP       dicyclomine  (BENTYL ) tablet 20 mg  20 mg Oral Q6H PRN Tex Drilling, NP   20 mg at 10/25/23 1114   haloperidol  (HALDOL ) tablet 5 mg  5 mg Oral TID PRN Tex Drilling, NP   5 mg at 10/25/23 0110  And   diphenhydrAMINE  (BENADRYL ) capsule 50 mg  50 mg Oral TID PRN Tex Drilling, NP   50 mg at 10/25/23 0111   haloperidol  lactate (HALDOL ) injection 10 mg  10 mg Intramuscular TID PRN Tex Drilling, NP       And   diphenhydrAMINE  (BENADRYL ) injection 50 mg  50 mg Intramuscular TID PRN Tex Drilling, NP       And   LORazepam  (ATIVAN )  injection 2 mg  2 mg Intramuscular TID PRN Tex Drilling, NP       haloperidol  lactate (HALDOL ) injection 5 mg  5 mg Intramuscular TID PRN Tex Drilling, NP       And   diphenhydrAMINE  (BENADRYL ) injection 50 mg  50 mg Intramuscular TID PRN Tex Drilling, NP       And   LORazepam  (ATIVAN ) injection 2 mg  2 mg Intramuscular TID PRN Tex Drilling, NP       hydrOXYzine  (ATARAX ) tablet 25 mg  25 mg Oral TID PRN Tex Drilling, NP   25 mg at 10/25/23 1113   loperamide  (IMODIUM ) capsule 2-4 mg  2-4 mg Oral PRN Tex Drilling, NP       magnesium  hydroxide (MILK OF MAGNESIA) suspension 30 mL  30 mL Oral Daily PRN Tex Drilling, NP       methocarbamol  (ROBAXIN ) tablet 500 mg  500 mg Oral Q8H PRN Tex Drilling, NP       naproxen  (NAPROSYN ) tablet 500 mg  500 mg Oral BID PRN Tex Drilling, NP   500 mg at 10/25/23 1109   nicotine  (NICODERM CQ  - dosed in mg/24 hours) patch 21 mg  21 mg Transdermal Q0600 Nkwenti, Drilling, NP       ondansetron  (ZOFRAN -ODT) disintegrating tablet 4 mg  4 mg Oral Q6H PRN Nkwenti, Doris, NP       traZODone  (DESYREL ) tablet 50 mg  50 mg Oral QHS PRN Tex Drilling, NP   50 mg at 10/24/23 2217   No current outpatient medications on file.    Labs  Lab Results:  Admission on 10/24/2023, Discharged on 10/24/2023  Component Date Value Ref Range Status   WBC 10/24/2023 8.9  4.0 - 10.5 K/uL Final   RBC 10/24/2023 4.27  3.87 - 5.11 MIL/uL Final   Hemoglobin 10/24/2023 12.4  12.0 - 15.0 g/dL Final   HCT 90/89/7974 37.3  36.0 - 46.0 % Final   MCV 10/24/2023 87.4  80.0 - 100.0 fL Final   MCH 10/24/2023 29.0  26.0 - 34.0 pg Final   MCHC 10/24/2023 33.2  30.0 - 36.0 g/dL Final   RDW 90/89/7974 11.9  11.5 - 15.5 % Final   Platelets 10/24/2023 330  150 - 400 K/uL Final   nRBC 10/24/2023 0.0  0.0 - 0.2 % Final   Neutrophils Relative % 10/24/2023 50  % Final   Neutro Abs 10/24/2023 4.4  1.7 - 7.7 K/uL Final   Lymphocytes Relative 10/24/2023 42  % Final   Lymphs Abs 10/24/2023 3.7   0.7 - 4.0 K/uL Final   Monocytes Relative 10/24/2023 5  % Final   Monocytes Absolute 10/24/2023 0.5  0.1 - 1.0 K/uL Final   Eosinophils Relative 10/24/2023 3  % Final   Eosinophils Absolute 10/24/2023 0.3  0.0 - 0.5 K/uL Final   Basophils Relative 10/24/2023 0  % Final   Basophils Absolute 10/24/2023 0.0  0.0 - 0.1 K/uL Final   Immature Granulocytes 10/24/2023 0  % Final   Abs Immature Granulocytes 10/24/2023 0.03  0.00 -  0.07 K/uL Final   Performed at [facility] Lab, [address] [place], [place] [address]   Sodium 10/24/2023 139  135 - 145 mmol/L Final   Potassium 10/24/2023 3.7  3.5 - 5.1 mmol/L Final   Chloride 10/24/2023 102  98 - 111 mmol/L Final   CO2 10/24/2023 29  22 - 32 mmol/L Final   Glucose, Bld 10/24/2023 72  70 - 99 mg/dL Final   Glucose reference range applies only to samples taken after fasting for at least 8 hours.   BUN 10/24/2023 8  6 - 20 mg/dL Final   Creatinine, Ser 10/24/2023 0.66  0.44 - 1.00 mg/dL Final   Calcium  10/24/2023 8.9  8.9 - 10.3 mg/dL Final   Total Protein 90/89/7974 5.7 (L)  6.5 - 8.1 g/dL Final   Albumin 90/89/7974 3.3 (L)  3.5 - 5.0 g/dL Final   AST 90/89/7974 19  15 - 41 U/L Final   ALT 10/24/2023 14  0 - 44 U/L Final   Alkaline Phosphatase 10/24/2023 45  38 - 126 U/L Final   Total Bilirubin 10/24/2023 0.5  0.0 - 1.2 mg/dL Final   GFR, Estimated 10/24/2023 >60  >60 mL/min Final   Comment: (NOTE) Calculated using the CKD-EPI Creatinine Equation (2021)    Anion gap 10/24/2023 8  5 - 15 Final   Performed at [facility] Lab, [address], [place], [place] [address]   Hgb A1c MFr Bld 10/24/2023 5.2  4.8 - 5.6 % Final   Comment: (NOTE) Diagnosis of Diabetes The following HbA1c ranges recommended by the American Diabetes Association (ADA) may be used as an aid in the diagnosis of diabetes mellitus.  Hemoglobin             Suggested A1C NGSP%              Diagnosis  <5.7                   Non Diabetic  5.7-6.4                 Pre-Diabetic  >6.4                   Diabetic  <7.0                   Glycemic control for                       adults with diabetes.     Mean Plasma Glucose 10/24/2023 102.54  mg/dL Final   Performed at [facility] Lab, [address] [place], [place] [address]   Magnesium  10/24/2023 1.9  1.7 - 2.4 mg/dL Final   Performed at [facility] Lab, [address] [place], [place] [address]   Alcohol, Ethyl (B) 10/24/2023 <15  <15 mg/dL Final   Comment: (NOTE) For medical purposes only. Performed at [facility] Lab, [address] [place], [place] [address]    Cholesterol 10/24/2023 110  0 - 200 mg/dL Final   Triglycerides 90/89/7974 39  <150 mg/dL Final   HDL 90/89/7974 48  >40 mg/dL Final   Total CHOL/HDL Ratio 10/24/2023 2.3  RATIO Final   VLDL 10/24/2023 8  0 - 40 mg/dL Final   LDL Cholesterol 10/24/2023 54  0 - 99 mg/dL Final   Comment:        Total Cholesterol/HDL:CHD Risk Coronary Heart Disease Risk Table  Men   Women  1/2 Average Risk   3.4   3.3  Average Risk       5.0   4.4  2 X Average Risk   9.6   7.1  3 X Average Risk  23.4   11.0        Use the calculated Patient Ratio above and the CHD Risk Table to determine the patient's CHD Risk.        ATP III CLASSIFICATION (LDL):  <100     mg/dL   Optimal  899-870  mg/dL   Near or Above                    Optimal  130-159  mg/dL   Borderline  839-810  mg/dL   High  >809     mg/dL   Very High Performed at [facility] Lab, [address], [place], [place] [address]    TSH 10/24/2023 1.765  0.350 - 4.500 uIU/mL Final   Comment: Performed by a 3rd Generation assay with a functional sensitivity of <=0.01 uIU/mL. Performed at [facility] Lab, [address], [place], [place] [address]    Color, Urine 10/24/2023 YELLOW  YELLOW Final   APPearance 10/24/2023 HAZY (A)  CLEAR Final   Specific Gravity, Urine 10/24/2023 1.024  1.005 - 1.030 Final   pH 10/24/2023 5.0  5.0 - 8.0 Final    Glucose, UA 10/24/2023 NEGATIVE  NEGATIVE mg/dL Final   Hgb urine dipstick 10/24/2023 SMALL (A)  NEGATIVE Final   Bilirubin Urine 10/24/2023 NEGATIVE  NEGATIVE Final   Ketones, ur 10/24/2023 NEGATIVE  NEGATIVE mg/dL Final   Protein, ur 90/89/7974 NEGATIVE  NEGATIVE mg/dL Final   Nitrite 90/89/7974 NEGATIVE  NEGATIVE Final   Leukocytes,Ua 10/24/2023 NEGATIVE  NEGATIVE Final   RBC / HPF 10/24/2023 0-5  0 - 5 RBC/hpf Final   WBC, UA 10/24/2023 0-5  0 - 5 WBC/hpf Final   Bacteria, UA 10/24/2023 NONE SEEN  NONE SEEN Final   Squamous Epithelial / HPF 10/24/2023 0-5  0 - 5 /HPF Final   Mucus 10/24/2023 PRESENT   Final   Ca Oxalate Crys, UA 10/24/2023 PRESENT   Final   Performed at [facility] Lab, [address], [place], [place] [address]   POC Amphetamine UR 10/24/2023 None Detected  NONE DETECTED (Cut Off Level 1000 ng/mL) Final   POC Secobarbital (BAR) 10/24/2023 None Detected  NONE DETECTED (Cut Off Level 300 ng/mL) Final   POC Buprenorphine  (BUP) 10/24/2023 None Detected  NONE DETECTED (Cut Off Level 10 ng/mL) Final   POC Oxazepam (BZO) 10/24/2023 None Detected  NONE DETECTED (Cut Off Level 300 ng/mL) Final   POC Cocaine UR 10/24/2023 Positive (A)  NONE DETECTED (Cut Off Level 300 ng/mL) Final   POC Methamphetamine UR 10/24/2023 Positive (A)  NONE DETECTED (Cut Off Level 1000 ng/mL) Final   POC Morphine  10/24/2023 Positive (A)  NONE DETECTED (Cut Off Level 300 ng/mL) Final   POC Methadone UR 10/24/2023 None Detected  NONE DETECTED (Cut Off Level 300 ng/mL) Final   POC Oxycodone  UR 10/24/2023 None Detected  NONE DETECTED (Cut Off Level 100 ng/mL) Final   POC Marijuana UR 10/24/2023 None Detected  NONE DETECTED (Cut Off Level 50 ng/mL) Final   Preg Test, Ur 10/24/2023 Negative  Negative Final   Vit D, 25-Hydroxy 10/24/2023 45.29  30 - 100 ng/mL Final   Comment: (NOTE) Vitamin D  deficiency has been defined by the Institute of Medicine  and an Endocrine Society practice guideline as a level  of serum 25-OH  vitamin D  less than 20 ng/mL (1,2). The Endocrine Society went on to  further define vitamin D  insufficiency as a level between 21 and 29  ng/mL (2).  1. IOM (Institute of Medicine). 2010. Dietary reference intakes for  calcium  and D. [place]: [employer]. 2. Holick MF, Binkley Elk, Bischoff-Ferrari HA, et al. Evaluation,  treatment, and prevention of vitamin D  deficiency: an Endocrine  Society clinical practice guideline, JCEM. 2011 Jul; 96(7): 1911-30.  Performed at [facility] Lab, [address] [place], [place] [address]    Vitamin B-12 10/24/2023 230  180 - 914 pg/mL Final   Comment: (NOTE) This assay is not validated for testing neonatal or myeloproliferative syndrome specimens for Vitamin B12 levels. Performed at [facility], [address] [place], [place] [address]   Admission on 05/17/2023, Discharged on 05/17/2023  Component Date Value Ref Range Status   WBC 05/17/2023 9.5  4.0 - 10.5 K/uL Final   RBC 05/17/2023 4.29  3.87 - 5.11 MIL/uL Final   Hemoglobin 05/17/2023 12.7  12.0 - 15.0 g/dL Final   HCT 95/96/7974 37.9  36.0 - 46.0 % Final   MCV 05/17/2023 88.3  80.0 - 100.0 fL Final   MCH 05/17/2023 29.6  26.0 - 34.0 pg Final   MCHC 05/17/2023 33.5  30.0 - 36.0 g/dL Final   RDW 95/96/7974 12.2  11.5 - 15.5 % Final   Platelets 05/17/2023 355  150 - 400 K/uL Final   nRBC 05/17/2023 0.0  0.0 - 0.2 % Final   Neutrophils Relative % 05/17/2023 49  % Final   Neutro Abs 05/17/2023 4.6  1.7 - 7.7 K/uL Final   Lymphocytes Relative 05/17/2023 41  % Final   Lymphs Abs 05/17/2023 3.9  0.7 - 4.0 K/uL Final   Monocytes Relative 05/17/2023 6  % Final   Monocytes Absolute 05/17/2023 0.6  0.1 - 1.0 K/uL Final   Eosinophils Relative 05/17/2023 4  % Final   Eosinophils Absolute 05/17/2023 0.3  0.0 - 0.5 K/uL Final   Basophils Relative 05/17/2023 0  % Final   Basophils Absolute 05/17/2023 0.0  0.0 - 0.1 K/uL Final   Immature  Granulocytes 05/17/2023 0  % Final   Abs Immature Granulocytes 05/17/2023 0.04  0.00 - 0.07 K/uL Final   Performed at [facility], [address], [place], [place] [address]   Sodium 05/17/2023 141  135 - 145 mmol/L Final   Potassium 05/17/2023 4.5  3.5 - 5.1 mmol/L Final   Chloride 05/17/2023 106  98 - 111 mmol/L Final   CO2 05/17/2023 25  22 - 32 mmol/L Final   Glucose, Bld 05/17/2023 84  70 - 99 mg/dL Final   Glucose reference range applies only to samples taken after fasting for at least 8 hours.   BUN 05/17/2023 8  6 - 20 mg/dL Final   Creatinine, Ser 05/17/2023 0.77  0.44 - 1.00 mg/dL Final   Calcium  05/17/2023 9.0  8.9 - 10.3 mg/dL Final   Total Protein 95/96/7974 6.3 (L)  6.5 - 8.1 g/dL Final   Albumin 95/96/7974 3.5  3.5 - 5.0 g/dL Final   AST 95/96/7974 22  15 - 41 U/L Final   ALT 05/17/2023 16  0 - 44 U/L Final   Alkaline Phosphatase 05/17/2023 57  38 - 126 U/L Final   Total Bilirubin 05/17/2023 0.6  0.0 - 1.2 mg/dL Final  GFR, Estimated 05/17/2023 >60  >60 mL/min Final   Comment: (NOTE) Calculated using the CKD-EPI Creatinine Equation (2021)    Anion gap 05/17/2023 10  5 - 15 Final   Performed at [facility] Lab, [address] [place], [place] [address]   Hgb A1c MFr Bld 05/17/2023 5.2  4.8 - 5.6 % Final   Comment: (NOTE) Pre diabetes:          5.7%-6.4%  Diabetes:              >6.4%  Glycemic control for   <7.0% adults with diabetes    Mean Plasma Glucose 05/17/2023 102.54  mg/dL Final   Performed at [facility] Lab, [address] [place], [place] [address]   Alcohol, Ethyl (B) 05/17/2023 <10  <10 mg/dL Final   Comment: (NOTE) Lowest detectable limit for serum alcohol is 10 mg/dL.  For medical purposes only. Performed at [facility] Lab, [address], [place], [place] [address]    Cholesterol 05/17/2023 142  0 - 200 mg/dL Final   Triglycerides 95/96/7974 237 (H)  <150 mg/dL Final   HDL 95/96/7974 59  >40 mg/dL Final   Total CHOL/HDL  Ratio 05/17/2023 2.4  RATIO Final   VLDL 05/17/2023 47 (H)  0 - 40 mg/dL Final   LDL Cholesterol 05/17/2023 36  0 - 99 mg/dL Final   Comment:        Total Cholesterol/HDL:CHD Risk Coronary Heart Disease Risk Table                     Men   Women  1/2 Average Risk   3.4   3.3  Average Risk       5.0   4.4  2 X Average Risk   9.6   7.1  3 X Average Risk  23.4   11.0        Use the calculated Patient Ratio above and the CHD Risk Table to determine the patient's CHD Risk.        ATP III CLASSIFICATION (LDL):  <100     mg/dL   Optimal  899-870  mg/dL   Near or Above                    Optimal  130-159  mg/dL   Borderline  839-810  mg/dL   High  >809     mg/dL   Very High Performed at [facility] Lab, [address], [place], [place] [address]    TSH 05/17/2023 0.589  0.350 - 4.500 uIU/mL Final   Comment: Performed by a 3rd Generation assay with a functional sensitivity of <=0.01 uIU/mL. Performed at [facility] Lab, [address], [place], [place] [address]    Color, Urine 05/17/2023 YELLOW  YELLOW Final   APPearance 05/17/2023 CLEAR  CLEAR Final   Specific Gravity, Urine 05/17/2023 1.025  1.005 - 1.030 Final   pH 05/17/2023 6.0  5.0 - 8.0 Final   Glucose, UA 05/17/2023 NEGATIVE  NEGATIVE mg/dL Final   Hgb urine dipstick 05/17/2023 NEGATIVE  NEGATIVE Final   Bilirubin Urine 05/17/2023 NEGATIVE  NEGATIVE Final   Ketones, ur 05/17/2023 NEGATIVE  NEGATIVE mg/dL Final   Protein, ur 95/96/7974 NEGATIVE  NEGATIVE mg/dL Final   Nitrite 95/96/7974 NEGATIVE  NEGATIVE Final   Leukocytes,Ua 05/17/2023 NEGATIVE  NEGATIVE Final   Comment: Microscopic not done on urines with negative protein, blood, leukocytes, nitrite, or glucose < 500 mg/dL. Performed  at [facility] Lab, [address] [place], [place] [address]    Preg Test, Ur 05/17/2023 Negative  Negative Final   POC Amphetamine UR 05/17/2023 None Detected  NONE DETECTED (Cut Off Level 1000 ng/mL) Final   POC Secobarbital  (BAR) 05/17/2023 None Detected  NONE DETECTED (Cut Off Level 300 ng/mL) Final   POC Buprenorphine  (BUP) 05/17/2023 None Detected  NONE DETECTED (Cut Off Level 10 ng/mL) Final   POC Oxazepam (BZO) 05/17/2023 None Detected  NONE DETECTED (Cut Off Level 300 ng/mL) Final   POC Cocaine UR 05/17/2023 Positive (A)  NONE DETECTED (Cut Off Level 300 ng/mL) Final   POC Methamphetamine UR 05/17/2023 None Detected  NONE DETECTED (Cut Off Level 1000 ng/mL) Final   POC Morphine  05/17/2023 Positive (A)  NONE DETECTED (Cut Off Level 300 ng/mL) Final   POC Methadone UR 05/17/2023 None Detected  NONE DETECTED (Cut Off Level 300 ng/mL) Final   POC Oxycodone  UR 05/17/2023 None Detected  NONE DETECTED (Cut Off Level 100 ng/mL) Final   POC Marijuana UR 05/17/2023 None Detected  NONE DETECTED (Cut Off Level 50 ng/mL) Final    Blood Alcohol level:  Lab Results  Component Value Date   [facility] <15 10/24/2023   ETH <10 05/17/2023    Metabolic Disorder Labs: Lab Results  Component Value Date   HGBA1C 5.2 10/24/2023   MPG 102.54 10/24/2023   MPG 102.54 05/17/2023   No results found for: PROLACTIN Lab Results  Component Value Date   CHOL 110 10/24/2023   TRIG 39 10/24/2023   HDL 48 10/24/2023   CHOLHDL 2.3 10/24/2023   VLDL 8 10/24/2023   LDLCALC 54 10/24/2023   LDLCALC 36 05/17/2023    Therapeutic Lab Levels: No results found for: LITHIUM No results found for: VALPROATE No results found for: CBMZ  Physical Findings   AIMS    Flowsheet Row Admission (Discharged) from 02/22/2018 in [facility] INPATIENT BE[facility]AVIORAL MEDICINE  AIMS Total Score 0   AUDIT    Flowsheet Row Admission (Discharged) from 02/22/2018 in [facility] INPATIENT BEHAVIORAL MEDICINE  Alcohol Use Disorder Identification Test Final Score (AUDIT) 8   PHQ2-9    Flowsheet Row ED from 10/24/2023 in [facility] ED from 05/18/2023 in [facility] ED from 03/29/2023 in [facility]  PHQ-2 Total Score 2 3 1   PHQ-9 Total Score 7 8 4    Flowsheet Row ED from 10/24/2023 in [facility] Most recent reading at 10/24/2023  1:38 PM ED from 10/24/2023 in [facility] Most recent reading at 10/24/2023  1:34 PM ED from 05/18/2023 in [facility] Emergency Department at [facility] Most recent reading at 05/18/2023  8:07 AM  C-SSRS RISK CATEGORY No Risk No Risk No Risk     Musculoskeletal  Strength & Muscle Tone: within normal limits Gait & Station: normal Patient leans: N/A  Psychiatric Specialty Exam  Presentation  General Appearance:  Disheveled  Eye Contact: Minimal  Speech: Slow  Speech Volume: Decreased  Handedness: Right   Mood and Affect  Mood: Irritable  Affect: Congruent   Thought Process  Thought Processes: Coherent  Descriptions of Associations:Intact  Orientation:Full (Time, Place and Person)  Thought Content:Logical  Diagnosis of Schizophrenia or Schizoaffective disorder in past: No    Hallucinations:Hallucinations: None  Ideas of Reference:None  Suicidal Thoughts:Suicidal Thoughts: No  Homicidal Thoughts:Homicidal Thoughts: No   Sensorium  Memory: Immediate Fair; Recent Fair; Remote Fair  Judgment: Fair  Insight: Fair   Art therapist  Concentration: Poor  Attention Span: Poor  Recall: [employer] of Knowledge: Fair  Language: Fair   Psychomotor Activity  Psychomotor Activity: Psychomotor Activity: Restlessness   Assets  Assets: Manufacturing systems engineer; Desire for Improvement; Social Support   Sleep  Sleep: Sleep: Poor   Physical Exam  Physical Exam Cardiovascular:     Rate and Rhythm: Normal rate.  Pulmonary:     Effort: Pulmonary effort is normal.  Musculoskeletal:        General: Normal range of motion.  Neurological:     Mental Status: She is alert and oriented to person, place, and time.     Review of Systems  Constitutional:  Positive for chills.  Cardiovascular:  Positive for palpitations.  Musculoskeletal:  Positive for myalgias.  Psychiatric/Behavioral:  Positive for substance abuse.    Blood pressure 104/89, pulse 78, temperature 98.3 F (36.8 C), resp. rate 18, SpO2 100%. There is no height or weight on file to calculate BMI.  Treatment Plan Summary: Patient admitted to the [facility] for mood stabilization and substance abuse treatment. Patient is voluntary. Anticipated discharge date is October 29, 2023 to [facility].   Medication regimen Clonidine  0.1 taper, ends on 10/28/23 for opioid withdrawal Increase trazodone  to 100 mg nightly as needed for sleep PRN Medications:acetaminophen , alum & mag hydroxide-simeth, dicyclomine , haloperidol  **AND** diphenhydrAMINE , haloperidol  lactate **AND** diphenhydrAMINE  **AND** LORazepam , haloperidol  lactate **AND** diphenhydrAMINE  **AND** LORazepam , hydrOXYzine , loperamide , magnesium  hydroxide, methocarbamol , naproxen , ondansetron , traZODone   Labs Positive for methamphetamine, cocaine and morphine  BAL negative Urine pregnancy negative TSH wnl Vitamin D  wnl Vitamin B12 wnl A1c wnl Lipid panel wnl  Disposition-pending, it is unclear if patient the would like for Ava, care manager here at the [facility] to make a referral for residential rehabilitation. Ava, case manager to reach out to the patient's case manager Mrs. Lanning to discuss discharge plan of care.  Per Ava, care manager The patient has ACTT services with [employer]. Writer was able to speak to the peer support specialists. Per the specialists, the plan is for the patient to be admitted to the [facility] on Monday.   Teresa Wyline CROME, NP 10/25/2023 1:04 PM

## 2023-10-25 NOTE — ED Notes (Signed)
 Pt bedtime medication is due, Clinical research associate tried severally to get her up, but pt goes back to sleep whenever attempted.

## 2023-10-25 NOTE — ED Notes (Signed)
 Pt reports 'not feeling good' pt administered prn atarax  and naproxen  for c/o anxiety and aches. Pt ate breakfast and provided with ice water, RN encouraged hydration. Pt denies si hi and avh- verbal contract for safety provided.

## 2023-10-25 NOTE — Group Note (Signed)
 Group Topic: Emotional Regulation  Group Date: 10/25/2023 Start Time: 1400 End Time: 1430 Facilitators: Laneta Renea POUR, NT  Department: [facility]  Number of Participants: 3  Group Focus: self-awareness Treatment Modality:  Psychoeducation Interventions utilized were problem solving Purpose: enhance coping skills  Name: Cassidy Underwood [facility] Date of Birth: Jul 03, 1995  MR: [id number]    Level of Participation: none Quality of Participation: none Interactions with others: none Mood/Affect: none Triggers (if applicable): none Cognition: none Progress: None Response: none Plan: patient will be encouraged to attend group.   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   Polysubstance dependence (HCC) 05/18/2023   Recurrent major depressive disorder (HCC) 04/02/2023   Rhabdomyolysis 06/25/2021   HO Drug overdose, accidental or unintentional 06/25/2021   AKI (acute kidney injury) (HCC) 06/25/2021   Leukocytosis 06/25/2021   Elevated LFTs 06/25/2021   Hyperglycemia 06/25/2021   Hearing loss 06/25/2021   [redacted] weeks gestation of pregnancy 12/17/2018   Normal labor 12/16/2018   Substance induced mood disorder (HCC)    Cocaine use disorder, severe, dependence (HCC) 02/22/2018   Major depressive disorder, single episode, severe without psychotic features (HCC) 02/22/2018   Major depressive disorder, single episode, severe without psychosis (HCC) 02/22/2018   Indication for care in labor or delivery 11/13/2017   Pregnancy with adoption planned 10/05/2017   IUGR (intrauterine growth restriction) affecting care of mother 10/05/2017   Supervision of high  risk pregnancy, antepartum 08/02/2017   Polysubstance abuse (HCC) 07/29/2017   Homelessness 11/02/2016

## 2023-10-25 NOTE — ED Notes (Addendum)
 Pt has been stating that she can not sleep. Pt has been to nurse station multiple times through out the night regarding night sweats, shakes, and chills. Pt is requesting medication.

## 2023-10-25 NOTE — Group Note (Signed)
 Group Topic: Wellness  Group Date: 10/25/2023 Start Time: 0900 End Time: 1000 Facilitators: Daved Tinnie HERO, RN  Department: [facility]  Number of Participants: 7  Group Focus: psychiatric education Treatment Modality:  Psychoeducation Interventions utilized were patient education Purpose: relapse prevention strategies  Name: Cassidy Underwood [facility] Date of Birth: [birth year]  MR: [id number]    Level of Participation: active Quality of Participation: attentive and cooperative Interactions with others: gave feedback Mood/Affect: appropriate Triggers (if applicable): n/a Cognition: coherent/clear Progress: Gaining insight Response: medication discussed with pt, further questions denied. Plan: patient will be encouraged to attend future RN education groups and to discuss medications with RN and provider.   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   Polysubstance dependence (HCC) 05/18/2023   Recurrent major depressive disorder (HCC) 04/02/2023   Rhabdomyolysis 06/25/2021   HO Drug overdose, accidental or unintentional 06/25/2021   AKI (acute kidney injury) (HCC) 06/25/2021   Leukocytosis 06/25/2021   Elevated LFTs 06/25/2021   Hyperglycemia 06/25/2021   Hearing loss 06/25/2021   [redacted] weeks gestation of pregnancy 12/17/2018   Normal labor 12/16/2018   Substance induced mood disorder (HCC)    Cocaine use disorder, severe, dependence (HCC) 02/22/2018   Major depressive disorder, single episode, severe without psychotic features (HCC) 02/22/2018   Major depressive disorder, single episode, severe without psychosis (HCC) 02/22/2018   Indication for  care in labor or delivery 11/13/2017   Pregnancy with adoption planned 10/05/2017   IUGR (intrauterine growth restriction) affecting care of mother 10/05/2017   Supervision of high risk pregnancy, antepartum 08/02/2017   Polysubstance abuse (HCC) 07/29/2017   Homelessness 11/02/2016

## 2023-10-25 NOTE — ED Notes (Signed)
 Patient is in the bedroom calm and composed NAD.
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
## 2023-10-26 DIAGNOSIS — F4310 Post-traumatic stress disorder, unspecified: Secondary | ICD-10-CM | POA: Diagnosis not present

## 2023-10-26 DIAGNOSIS — F17210 Nicotine dependence, cigarettes, uncomplicated: Secondary | ICD-10-CM | POA: Diagnosis not present

## 2023-10-26 DIAGNOSIS — F319 Bipolar disorder, unspecified: Secondary | ICD-10-CM | POA: Diagnosis not present

## 2023-10-26 DIAGNOSIS — F1124 Opioid dependence with opioid-induced mood disorder: Secondary | ICD-10-CM | POA: Diagnosis not present

## 2023-10-26 MED ORDER — BUPRENORPHINE HCL-NALOXONE HCL 2-0.5 MG SL SUBL
1.0000 | SUBLINGUAL_TABLET | Freq: Two times a day (BID) | SUBLINGUAL | Status: DC
  Administered 2023-10-26 – 2023-10-27 (×3): 1 via SUBLINGUAL
  Filled 2023-10-26 (×3): qty 1

## 2023-10-26 NOTE — Care Management (Signed)
 [facility] Care Management   The House Manager at the [facility] [phone] reports that the patient's insurance is not sufficient for her facility and she will need to be referred to another substance abuse facility within the [facility] program.    Writer spoke to the The ACTT Team and they will send a referral packet to the [place] location because this facility accepts [employer].  The phone number for the facility is [phone].     Writer will refer patient to [facility], [facility] and [facility].    1:24pm  Writer informed the patient tof the new discharge plan for the patient to be discharged on Monday to her ACTT Team at 10:30am
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
## 2023-10-26 NOTE — ED Provider Notes (Cosign Needed Addendum)
 Behavioral Health Progress Note  Date and Time: 10/26/2023 11:27 AM Name: Cassidy Underwood [facility] MRN:  [id number]  Subjective:  Cassidy Underwood is a 28 year old female patient who was admitted to the [facility] on 10/24/2023 requesting detox from opioids. She was accompanied by her peer support specialist and case manager. Patient has a documented history of MDD, PTSD, ODD, substance-induced mood disorder, drug overdose, and polysubstance abuse. Patient reported abusing heroin for the past 2 years and reported using $80 daily, by snorting and smoking. She reported using crack/cocaine for several years. Urine drug screen on arrival was positive for cocaine, methamphetamine and morphine . BAL was negative.  On evaluation, patient states that she feels like crap. She states that she is having withdrawal from using marijuana and cocaine mixed together. She reports opioid withdrawal symptoms of hot and cold flashes, increased anxiety, palpitations, abdominal cramps and 2 loose stools yesterday. She asked if she could start Suboxone  to help with her withdrawal. She states that she has never been on Suboxone  in the past but was told that it with withdrawal. Patient was encouraged to stay hydrated and increase fluid intake and stay compliant with taking clonidine  to reduce opioid withdrawal symptoms. Cows score is 9. Patient also encouraged she reports symptoms to nursing staff so that she will receive the appropriate medications to treat her symptoms. Will discuss initiating Suboxone  with Dr. Cole. I explained to patient that we would have to confirm with [facility] if patient would be allowed to take Suboxone  at their facility. Patient verbalizes understanding. Patient denies depressive symptoms. She denies suicidal thoughts. She denies homicidal thoughts. She denies auditory or visual hallucinations. She denies paranoia. She reports fair appetite. She reports poor sleep  due to withdrawal symptoms. She denies medication side effects. I discussed with the patient that she has been accepted to the [facility] for Monday, October 29, 2023  that was arranged by her caseworker for residential substance abuse treatment. Patient states that she would like to speak with the social worker about the [facility] treatment program. Will have Ava, case manager speak with patient about discharge plan of care.  Per Ava, case manager, patient was not accepted to the [facility]. Ava, to update patient on discharge plan of care.   Diagnosis:  Final diagnoses:  Polysubstance abuse (HCC)  Cocaine abuse (HCC)  Methamphetamine use (HCC)  Opioid use with withdrawal (HCC)    Total Time spent with patient: 30 minutes  Past Psychiatric History: Patient has a documented history of MDD, PTSD, ODD, substance-induced mood disorder, drug overdose, and polysubstance abuse.   Past Medical History: Per chart review, history of hearing loss, and elevated liver enzymes.  Family Psychiatric  History: Patient adopted and has no known history of biological parents/family.  Social History: Homelessness.  Adopted. Patient is unemployed and has no source of income. History of heroin, crack methamphetamine, and fentanyl  use.   Current Medications:  Current Facility-Administered Medications  Medication Dose Route Frequency Provider Last Rate Last Admin   acetaminophen  (TYLENOL ) tablet 650 mg  650 mg Oral Q6H PRN Tex Drilling, NP   650 mg at 10/25/23 1445   alum & mag hydroxide-simeth (MAALOX/MYLANTA) 200-200-20 MG/5ML suspension 30 mL  30 mL Oral Q4H PRN Tex Drilling, NP       cloNIDine  (CATAPRES ) tablet 0.1 mg  0.1 mg Oral BH-qamhs Nkwenti, Doris, NP   0.1 mg at 10/26/23 9076   Followed by   NOREEN  ON 10/28/2023] cloNIDine  (CATAPRES ) tablet 0.1 mg  0.1 mg Oral QAC breakfast Tex Drilling, NP       dicyclomine  (BENTYL ) tablet 20 mg  20 mg Oral Q6H PRN  Tex Drilling, NP   20 mg at 10/25/23 1446   haloperidol  (HALDOL ) tablet 5 mg  5 mg Oral TID PRN Tex Drilling, NP   5 mg at 10/25/23 0110   And   diphenhydrAMINE  (BENADRYL ) capsule 50 mg  50 mg Oral TID PRN Tex Drilling, NP   50 mg at 10/25/23 0111   haloperidol  lactate (HALDOL ) injection 10 mg  10 mg Intramuscular TID PRN Tex Drilling, NP       And   diphenhydrAMINE  (BENADRYL ) injection 50 mg  50 mg Intramuscular TID PRN Tex Drilling, NP       And   LORazepam  (ATIVAN ) injection 2 mg  2 mg Intramuscular TID PRN Tex Drilling, NP       haloperidol  lactate (HALDOL ) injection 5 mg  5 mg Intramuscular TID PRN Tex Drilling, NP       And   diphenhydrAMINE  (BENADRYL ) injection 50 mg  50 mg Intramuscular TID PRN Tex Drilling, NP       And   LORazepam  (ATIVAN ) injection 2 mg  2 mg Intramuscular TID PRN Tex Drilling, NP       hydrOXYzine  (ATARAX ) tablet 25 mg  25 mg Oral TID PRN Tex Drilling, NP   25 mg at 10/26/23 0800   loperamide  (IMODIUM ) capsule 2-4 mg  2-4 mg Oral PRN Tex Drilling, NP       magnesium  hydroxide (MILK OF MAGNESIA) suspension 30 mL  30 mL Oral Daily PRN Tex Drilling, NP       methocarbamol  (ROBAXIN ) tablet 500 mg  500 mg Oral Q8H PRN Tex Drilling, NP   500 mg at 10/26/23 0534   naproxen  (NAPROSYN ) tablet 500 mg  500 mg Oral BID PRN Tex Drilling, NP   500 mg at 10/26/23 0534   nicotine  (NICODERM CQ  - dosed in mg/24 hours) patch 21 mg  21 mg Transdermal Q0600 Nkwenti, Drilling, NP       ondansetron  (ZOFRAN -ODT) disintegrating tablet 4 mg  4 mg Oral Q6H PRN Nkwenti, Doris, NP       traZODone  (DESYREL ) tablet 100 mg  100 mg Oral QHS PRN Lajeana Strough L, NP       No current outpatient medications on file.    Labs  Lab Results:  Admission on 10/24/2023, Discharged on 10/24/2023  Component Date Value Ref Range Status   WBC 10/24/2023 8.9  4.0 - 10.5 K/uL Final   RBC 10/24/2023 4.27  3.87 - 5.11 MIL/uL Final   Hemoglobin 10/24/2023 12.4  12.0 - 15.0 g/dL  Final   HCT 90/89/7974 37.3  36.0 - 46.0 % Final   MCV 10/24/2023 87.4  80.0 - 100.0 fL Final   MCH 10/24/2023 29.0  26.0 - 34.0 pg Final   MCHC 10/24/2023 33.2  30.0 - 36.0 g/dL Final   RDW 90/89/7974 11.9  11.5 - 15.5 % Final   Platelets 10/24/2023 330  150 - 400 K/uL Final   nRBC 10/24/2023 0.0  0.0 - 0.2 % Final   Neutrophils Relative % 10/24/2023 50  % Final   Neutro Abs 10/24/2023 4.4  1.7 - 7.7 K/uL Final   Lymphocytes Relative 10/24/2023 42  % Final   Lymphs Abs 10/24/2023 3.7  0.7 - 4.0 K/uL Final   Monocytes Relative 10/24/2023 5  % Final   Monocytes  Absolute 10/24/2023 0.5  0.1 - 1.0 K/uL Final   Eosinophils Relative 10/24/2023 3  % Final   Eosinophils Absolute 10/24/2023 0.3  0.0 - 0.5 K/uL Final   Basophils Relative 10/24/2023 0  % Final   Basophils Absolute 10/24/2023 0.0  0.0 - 0.1 K/uL Final   Immature Granulocytes 10/24/2023 0  % Final   Abs Immature Granulocytes 10/24/2023 0.03  0.00 - 0.07 K/uL Final   Performed at [facility], [address], [place], [place] [address]   Sodium 10/24/2023 139  135 - 145 mmol/L Final   Potassium 10/24/2023 3.7  3.5 - 5.1 mmol/L Final   Chloride 10/24/2023 102  98 - 111 mmol/L Final   CO2 10/24/2023 29  22 - 32 mmol/L Final   Glucose, Bld 10/24/2023 72  70 - 99 mg/dL Final   Glucose reference range applies only to samples taken after fasting for at least 8 hours.   BUN 10/24/2023 8  6 - 20 mg/dL Final   Creatinine, Ser 10/24/2023 0.66  0.44 - 1.00 mg/dL Final   Calcium  10/24/2023 8.9  8.9 - 10.3 mg/dL Final   Total Protein 90/89/7974 5.7 (L)  6.5 - 8.1 g/dL Final   Albumin 90/89/7974 3.3 (L)  3.5 - 5.0 g/dL Final   AST 90/89/7974 19  15 - 41 U/L Final   ALT 10/24/2023 14  0 - 44 U/L Final   Alkaline Phosphatase 10/24/2023 45  38 - 126 U/L Final   Total Bilirubin 10/24/2023 0.5  0.0 - 1.2 mg/dL Final   GFR, Estimated 10/24/2023 >60  >60 mL/min Final   Comment: (NOTE) Calculated using the CKD-EPI Creatinine Equation  (2021)    Anion gap 10/24/2023 8  5 - 15 Final   Performed at [facility], [address], [place], [place] [address]   Hgb A1c MFr Bld 10/24/2023 5.2  4.8 - 5.6 % Final   Comment: (NOTE) Diagnosis of Diabetes The following HbA1c ranges recommended by the American Diabetes Association (ADA) may be used as an aid in the diagnosis of diabetes mellitus.  Hemoglobin             Suggested A1C NGSP%              Diagnosis  <5.7                   Non Diabetic  5.7-6.4                Pre-Diabetic  >6.4                   Diabetic  <7.0                   Glycemic control for                       adults with diabetes.     Mean Plasma Glucose 10/24/2023 102.54  mg/dL Final   Performed at [facility], [address] [place], [place] [address]   Magnesium  10/24/2023 1.9  1.7 - 2.4 mg/dL Final   Performed at [facility], [address], [place], [place] [address]   Alcohol, Ethyl (B) 10/24/2023 <15  <15 mg/dL Final   Comment: (NOTE) For medical purposes only. Performed at [facility], [address], [place], [place] [address]    Cholesterol 10/24/2023 110  0 - 200 mg/dL Final   Triglycerides 90/89/7974  39  <150 mg/dL Final   HDL 90/89/7974 48  >40 mg/dL Final   Total CHOL/HDL Ratio 10/24/2023 2.3  RATIO Final   VLDL 10/24/2023 8  0 - 40 mg/dL Final   LDL Cholesterol 10/24/2023 54  0 - 99 mg/dL Final   Comment:        Total Cholesterol/HDL:CHD Risk Coronary Heart Disease Risk Table                     Men   Women  1/2 Average Risk   3.4   3.3  Average Risk       5.0   4.4  2 X Average Risk   9.6   7.1  3 X Average Risk  23.4   11.0        Use the calculated Patient Ratio above and the CHD Risk Table to determine the patient's CHD Risk.        ATP III CLASSIFICATION (LDL):  <100     mg/dL   Optimal  899-870  mg/dL   Near or Above                    Optimal  130-159  mg/dL   Borderline  839-810  mg/dL   High  >809     mg/dL   Very  High Performed at [facility], [address], [place], [place] [address]    TSH 10/24/2023 1.765  0.350 - 4.500 uIU/mL Final   Comment: Performed by a 3rd Generation assay with a functional sensitivity of <=0.01 uIU/mL. Performed at [facility], [address], [place], [place] [address]    Color, Urine 10/24/2023 YELLOW  YELLOW Final   APPearance 10/24/2023 HAZY (A)  CLEAR Final   Specific Gravity, Urine 10/24/2023 1.024  1.005 - 1.030 Final   pH 10/24/2023 5.0  5.0 - 8.0 Final   Glucose, UA 10/24/2023 NEGATIVE  NEGATIVE mg/dL Final   Hgb urine dipstick 10/24/2023 SMALL (A)  NEGATIVE Final   Bilirubin Urine 10/24/2023 NEGATIVE  NEGATIVE Final   Ketones, ur 10/24/2023 NEGATIVE  NEGATIVE mg/dL Final   Protein, ur 90/89/7974 NEGATIVE  NEGATIVE mg/dL Final   Nitrite 90/89/7974 NEGATIVE  NEGATIVE Final   Leukocytes,Ua 10/24/2023 NEGATIVE  NEGATIVE Final   RBC / HPF 10/24/2023 0-5  0 - 5 RBC/hpf Final   WBC, UA 10/24/2023 0-5  0 - 5 WBC/hpf Final   Bacteria, UA 10/24/2023 NONE SEEN  NONE SEEN Final   Squamous Epithelial / HPF 10/24/2023 0-5  0 - 5 /HPF Final   Mucus 10/24/2023 PRESENT   Final   Ca Oxalate Crys, UA 10/24/2023 PRESENT   Final   Performed at [facility], [address], [place], [place] [address]   POC Amphetamine UR 10/24/2023 None Detected  NONE DETECTED (Cut Off Level 1000 ng/mL) Final   POC Secobarbital (BAR) 10/24/2023 None Detected  NONE DETECTED (Cut Off Level 300 ng/mL) Final   POC Buprenorphine  (BUP) 10/24/2023 None Detected  NONE DETECTED (Cut Off Level 10 ng/mL) Final   POC Oxazepam (BZO) 10/24/2023 None Detected  NONE DETECTED (Cut Off Level 300 ng/mL) Final   POC Cocaine UR 10/24/2023 Positive (A)  NONE DETECTED (Cut Off Level 300 ng/mL) Final   POC Methamphetamine UR 10/24/2023 Positive (A)  NONE DETECTED (Cut Off Level 1000 ng/mL) Final   POC Morphine  10/24/2023 Positive (A)  NONE DETECTED (Cut Off Level 300 ng/mL) Final   POC Methadone  UR  10/24/2023 None Detected  NONE DETECTED (Cut Off Level 300 ng/mL) Final   POC Oxycodone  UR 10/24/2023 None Detected  NONE DETECTED (Cut Off Level 100 ng/mL) Final   POC Marijuana UR 10/24/2023 None Detected  NONE DETECTED (Cut Off Level 50 ng/mL) Final   Preg Test, Ur 10/24/2023 Negative  Negative Final   Vit D, 25-Hydroxy 10/24/2023 45.29  30 - 100 ng/mL Final   Comment: (NOTE) Vitamin D  deficiency has been defined by the Institute of Medicine  and an Endocrine Society practice guideline as a level of serum 25-OH  vitamin D  less than 20 ng/mL (1,2). The Endocrine Society went on to  further define vitamin D  insufficiency as a level between 21 and 29  ng/mL (2).  1. IOM (Institute of Medicine). 2010. Dietary reference intakes for  calcium  and D. [place]: The [employer]. 2. Holick MF, Bink[place], Bischoff-Ferrari HA, et al. Evaluation,  treatment, and prevention of vitamin D  deficiency: an Endocrine  Society clinical practice guideline, JCEM. 2011 Jul; 96(7): 1911-30.  Performed at [facility] Lab, [address], [place], [place] [address]    Vitamin B-12 10/24/2023 230  180 - 914 pg/mL Final   Comment: (NOTE) This assay is not validated for testing neonatal or myeloproliferative syndrome specimens for Vitamin B12 levels. Performed at [facility], [address], [place], [place] [address]   Admission on 05/17/2023, Discharged on 05/17/2023  Component Date Value Ref Range Status   WBC 05/17/2023 9.5  4.0 - 10.5 K/uL Final   RBC 05/17/2023 4.29  3.87 - 5.11 MIL/uL Final   Hemoglobin 05/17/2023 12.7  12.0 - 15.0 g/dL Final   HCT 95/96/7974 37.9  36.0 - 46.0 % Final   MCV 05/17/2023 88.3  80.0 - 100.0 fL Final   MCH 05/17/2023 29.6  26.0 - 34.0 pg Final   MCHC 05/17/2023 33.5  30.0 - 36.0 g/dL Final   RDW 95/96/7974 12.2  11.5 - 15.5 % Final   Platelets 05/17/2023 355  150 - 400 K/uL Final   nRBC 05/17/2023 0.0  0.0 - 0.2 % Final   Neutrophils  Relative % 05/17/2023 49  % Final   Neutro Abs 05/17/2023 4.6  1.7 - 7.7 K/uL Final   Lymphocytes Relative 05/17/2023 41  % Final   Lymphs Abs 05/17/2023 3.9  0.7 - 4.0 K/uL Final   Monocytes Relative 05/17/2023 6  % Final   Monocytes Absolute 05/17/2023 0.6  0.1 - 1.0 K/uL Final   Eosinophils Relative 05/17/2023 4  % Final   Eosinophils Absolute 05/17/2023 0.3  0.0 - 0.5 K/uL Final   Basophils Relative 05/17/2023 0  % Final   Basophils Absolute 05/17/2023 0.0  0.0 - 0.1 K/uL Final   Immature Granulocytes 05/17/2023 0  % Final   Abs Immature Granulocytes 05/17/2023 0.04  0.00 - 0.07 K/uL Final   Performed at [facility], [address] [place], [place] [address]   Sodium 05/17/2023 141  135 - 145 mmol/L Final   Potassium 05/17/2023 4.5  3.5 - 5.1 mmol/L Final   Chloride 05/17/2023 106  98 - 111 mmol/L Final   CO2 05/17/2023 25  22 - 32 mmol/L Final   Glucose, Bld 05/17/2023 84  70 - 99 mg/dL Final   Glucose reference range applies only to samples taken after fasting for at least 8 hours.   BUN 05/17/2023 8  6 - 20 mg/dL Final   Creatinine, Ser 05/17/2023 0.77  0.44 - 1.00 mg/dL Final   Calcium  05/17/2023 9.0  8.9 - 10.3 mg/dL Final   Total Protein 95/96/7974 6.3 (L)  6.5 - 8.1 g/dL Final   Albumin 95/96/7974 3.5  3.5 - 5.0 g/dL Final   AST 95/96/7974 22  15 - 41 U/L Final   ALT 05/17/2023 16  0 - 44 U/L Final   Alkaline Phosphatase 05/17/2023 57  38 - 126 U/L Final   Total Bilirubin 05/17/2023 0.6  0.0 - 1.2 mg/dL Final   GFR, Estimated 05/17/2023 >60  >60 mL/min Final   Comment: (NOTE) Calculated using the CKD-EPI Creatinine Equation (2021)    Anion gap 05/17/2023 10  5 - 15 Final   Performed at [facility], [address] [place], [place] [address]   Hgb A1c MFr Bld 05/17/2023 5.2  4.8 - 5.6 % Final   Comment: (NOTE) Pre diabetes:          5.7%-6.4%  Diabetes:              >6.4%  Glycemic control for   <7.0% adults with diabetes    Mean Plasma Glucose  05/17/2023 102.54  mg/dL Final   Performed at [facility], [address], [place], [place] [address]   Alcohol, Ethyl (B) 05/17/2023 <10  <10 mg/dL Final   Comment: (NOTE) Lowest detectable limit for serum alcohol is 10 mg/dL.  For medical purposes only. Performed at [facility], [address], [place], [place] [address]    Cholesterol 05/17/2023 142  0 - 200 mg/dL Final   Triglycerides 95/96/7974 237 (H)  <150 mg/dL Final   HDL 95/96/7974 59  >40 mg/dL Final   Total CHOL/HDL Ratio 05/17/2023 2.4  RATIO Final   VLDL 05/17/2023 47 (H)  0 - 40 mg/dL Final   LDL Cholesterol 05/17/2023 36  0 - 99 mg/dL Final   Comment:        Total Cholesterol/HDL:CHD Risk Coronary Heart Disease Risk Table                     Men   Women  1/2 Average Risk   3.4   3.3  Average Risk       5.0   4.4  2 X Average Risk   9.6   7.1  3 X Average Risk  23.4   11.0        Use the calculated Patient Ratio above and the CHD Risk Table to determine the patient's CHD Risk.        ATP III CLASSIFICATION (LDL):  <100     mg/dL   Optimal  899-870  mg/dL   Near or Above                    Optimal  130-159  mg/dL   Borderline  839-810  mg/dL   High  >809     mg/dL   Very High Performed at [facility], [address], [place], [place] [address]    TSH 05/17/2023 0.589  0.350 - 4.500 uIU/mL Final   Comment: Performed by a 3rd Generation assay with a functional sensitivity of <=0.01 uIU/mL. Performed at [facility], [address] [place], [place] [address]    Color, Urine 05/17/2023 YELLOW  YELLOW Final   APPearance 05/17/2023 CLEAR  CLEAR Final   Specific Gravity, Urine 05/17/2023 1.025  1.005 - 1.030 Final   pH 05/17/2023 6.0  5.0 -  8.0 Final   Glucose, UA 05/17/2023 NEGATIVE  NEGATIVE mg/dL Final   Hgb urine dipstick 05/17/2023 NEGATIVE  NEGATIVE Final   Bilirubin Urine 05/17/2023 NEGATIVE  NEGATIVE Final   Ketones, ur 05/17/2023 NEGATIVE  NEGATIVE mg/dL Final   Protein, ur  95/96/7974 NEGATIVE  NEGATIVE mg/dL Final   Nitrite 95/96/7974 NEGATIVE  NEGATIVE Final   Leukocytes,Ua 05/17/2023 NEGATIVE  NEGATIVE Final   Comment: Microscopic not done on urines with negative protein, blood, leukocytes, nitrite, or glucose < 500 mg/dL. Performed at [facility] Lab, [address] [place], [place] [address]    Preg Test, Ur 05/17/2023 Negative  Negative Final   POC Amphetamine UR 05/17/2023 None Detected  NONE DETECTED (Cut Off Level 1000 ng/mL) Final   POC Secobarbital (BAR) 05/17/2023 None Detected  NONE DETECTED (Cut Off Level 300 ng/mL) Final   POC Buprenorphine  (BUP) 05/17/2023 None Detected  NONE DETECTED (Cut Off Level 10 ng/mL) Final   POC Oxazepam (BZO) 05/17/2023 None Detected  NONE DETECTED (Cut Off Level 300 ng/mL) Final   POC Cocaine UR 05/17/2023 Positive (A)  NONE DETECTED (Cut Off Level 300 ng/mL) Final   POC Methamphetamine UR 05/17/2023 None Detected  NONE DETECTED (Cut Off Level 1000 ng/mL) Final   POC Morphine  05/17/2023 Positive (A)  NONE DETECTED (Cut Off Level 300 ng/mL) Final   POC Methadone UR 05/17/2023 None Detected  NONE DETECTED (Cut Off Level 300 ng/mL) Final   POC Oxycodone  UR 05/17/2023 None Detected  NONE DETECTED (Cut Off Level 100 ng/mL) Final   POC Marijuana UR 05/17/2023 None Detected  NONE DETECTED (Cut Off Level 50 ng/mL) Final    Blood Alcohol level:  Lab Results  Component Value Date   [facility] <15 10/24/2023   ETH <10 05/17/2023    Metabolic Disorder Labs: Lab Results  Component Value Date   HGBA1C 5.2 10/24/2023   MPG 102.54 10/24/2023   MPG 102.54 05/17/2023   No results found for: PROLACTIN Lab Results  Component Value Date   CHOL 110 10/24/2023   TRIG 39 10/24/2023   HDL 48 10/24/2023   CHOLHDL 2.3 10/24/2023   VLDL 8 10/24/2023   LDLCALC 54 10/24/2023   LDLCALC 36 05/17/2023    Therapeutic Lab Levels: No results found for: LITHIUM No results found for: VALPROATE No results found for:  CBMZ  Physical Findings   AIMS    Flowsheet Row Admission (Discharged) from 02/22/2018 in [facility] INPATIENT BEHAVIORAL MEDICINE  AIMS Total Score 0   AUDIT    Flowsheet Row Admission (Discharged) from 02/22/2018 in [facility] INPATIENT BEHAVIORAL MEDICINE  Alcohol Use Disorder Identification Test Final Score (AUDIT) 8   PHQ2-9    Flowsheet Row ED from 10/24/2023 in [facility] ED from 05/18/2023 in [facility] ED from 03/29/2023 in [place]  PHQ-2 Total Score 2 3 1   PHQ-9 Total Score 7 8 4    Flowsheet Row ED from 10/24/2023 in [facility] Most recent reading at 10/24/2023  1:38 PM ED from 10/24/2023 in [facility] Most recent reading at 10/24/2023  1:34 PM ED from 05/18/2023 in [facility] Emergency Department at [facility] Most recent reading at 05/18/2023  8:07 AM  C-SSRS RISK CATEGORY No Risk No Risk No Risk     Musculoskeletal  Strength & Muscle Tone: within normal limits Gait & Station: normal Patient leans: N/A  Psychiatric Specialty Exam  Presentation  General Appearance:  Disheveled  Eye Contact: Fair  Speech: Clear and Coherent  Speech Volume: Normal  Handedness: Right   Mood and Affect  Mood: Euthymic  Affect: Congruent   Thought Process  Thought Processes: Coherent  Descriptions of Associations:Intact  Orientation:Full (Time, Place and Person)  Thought Content:Logical  Diagnosis of Schizophrenia or Schizoaffective disorder in past: No    Hallucinations:Hallucinations: None  Ideas of Reference:None  Suicidal Thoughts:Suicidal Thoughts: No  Homicidal Thoughts:Homicidal Thoughts: No   Sensorium  Memory: Immediate Fair; Recent Fair; Remote Fair  Judgment: Fair  Insight: Fair   Art therapist  Concentration: Fair  Attention Span: Fair  Recall: [employer] of  Knowledge: Fair  Language: Fair   Psychomotor Activity  Psychomotor Activity: Psychomotor Activity: Restlessness   Assets  Assets: Communication Skills; Desire for Improvement; Social Support   Sleep  Sleep: Sleep: Fair   Physical Exam  Physical Exam Cardiovascular:     Rate and Rhythm: Normal rate.  Pulmonary:     Effort: Pulmonary effort is normal.  Musculoskeletal:        General: Normal range of motion.     Cervical back: Normal range of motion.  Neurological:     Mental Status: She is alert and oriented to person, place, and time.    Review of Systems  Constitutional:  Positive for chills.  Cardiovascular:  Positive for palpitations.  Musculoskeletal:  Positive for myalgias.  Psychiatric/Behavioral:  Positive for substance abuse.    Blood pressure 117/85, pulse 75, temperature 97.8 F (36.6 C), temperature source Oral, resp. rate 18, SpO2 97%. There is no height or weight on file to calculate BMI.  Treatment Plan Summary: Patient admitted to the [facility] for mood stabilization and substance abuse treatment. Patient is voluntary. Clonidine  taper ends on 10/28/23. Anticipated discharge date is pending residential placement. Patient referred to [facility]. Patient to rescind 72 hour request for discharge. Patient has ACT services with [facility].  Medication regimen Discontinue Clonidine  0.1 taper Start Suboxone  2-0.5 mg per SL daily for OUD Increase trazodone  to 100 mg nightly as needed for sleep PRN Medications:acetaminophen , alum & mag hydroxide-simeth, dicyclomine , haloperidol  **AND** diphenhydrAMINE , haloperidol  lactate **AND** diphenhydrAMINE  **AND** LORazepam , haloperidol  lactate **AND** diphenhydrAMINE  **AND** LORazepam , hydrOXYzine , loperamide , magnesium  hydroxide, methocarbamol , naproxen , ondansetron , traZODone   Labs Positive for methamphetamine, cocaine and morphine  BAL negative Urine pregnancy  negative TSH wnl Vitamin D  wnl Vitamin B12 wnl A1c wnl Lipid panel wnl  Disposition-Anticipated discharge date is pending residential placement.   Teresa Wyline CROME, NP 10/26/2023 11:27 AM

## 2023-10-26 NOTE — Care Management (Incomplete Revision)
 [facility] Care Management   The House Manager at the [facility] [phone] reports that the patient's insurance is not sufficient for her facility and she will need to be referred to another substance abuse facility within the [facility] program.    Writer spoke to the [facility] and they will send a referral packet to the [place] location because this facility accepts [employer].  The phone number for the facility is [phone].     Writer will refer patient to [facility], [facility] and [facility].    1:24pm  Writer informed the patine tof the new discharge plan.

## 2023-10-26 NOTE — ED Notes (Signed)
 Pt is sleeping. No acute distress noted. Q15 safety checks in place.

## 2023-10-26 NOTE — ED Notes (Signed)
 Patient is in the bedroom sleeping. NAD

## 2023-10-26 NOTE — ED Notes (Signed)
 Patient is in the Dayroom calm and composed watching TV with other patients. NAD. Denies needing anything. Respirations even and unlabored Environment secured per policy. Will continue to monitor for safety.

## 2023-10-26 NOTE — Group Note (Signed)
 Group Topic: Relapse and Recovery  Group Date: 10/26/2023 Start Time: 2000 End Time: 2100 Facilitators: Joan Plowman B  Department: [facility]  Number of Participants: 5  Group Focus: abuse issues Treatment Modality:  Leisure Development Interventions utilized were leisure development Purpose: relapse prevention strategies  Name: Cassidy Underwood Date of Birth: 21-May-1995  MR: [id number]    Level of Participation: active Quality of Participation: attentive and cooperative Interactions with others: gave feedback Mood/Affect: appropriate Triggers (if applicable): NA Cognition: coherent/clear Progress: Gaining insight Response: NA Plan: patient will be encouraged to keep going to groups  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   Polysubstance dependence (HCC) 05/18/2023   Recurrent major depressive disorder (HCC) 04/02/2023   Rhabdomyolysis 06/25/2021   HO Drug overdose, accidental or unintentional 06/25/2021   AKI (acute kidney injury) (HCC) 06/25/2021   Leukocytosis 06/25/2021   Elevated LFTs 06/25/2021   Hyperglycemia 06/25/2021   Hearing loss 06/25/2021   [redacted] weeks gestation of pregnancy 12/17/2018   Normal labor 12/16/2018   Substance induced mood disorder (HCC)    Cocaine use disorder, severe, dependence (HCC) 02/22/2018   Major depressive disorder, single episode, severe without psychotic features (HCC) 02/22/2018   Major depressive disorder, single episode, severe without psychosis (HCC) 02/22/2018   Indication for care in labor or delivery 11/13/2017   Pregnancy with adoption planned 10/05/2017   IUGR (intrauterine growth  restriction) affecting care of mother 10/05/2017   Supervision of high risk pregnancy, antepartum 08/02/2017   Polysubstance abuse (HCC) 07/29/2017   Homelessness 11/02/2016

## 2023-10-26 NOTE — ED Notes (Signed)
 Pt had visit from peer support (from ACTT team)  Pt reports feeling a little better,  She continues to be medication focused.  Pt was started on suboxone  2-0.5mg  SL bid,  She reports no current side effects from medication. COWS score was 4. Pt showered this shift Q 15 minute observations for safety continue

## 2023-10-26 NOTE — Group Note (Signed)
 Group Topic: Wellness  Group Date: 10/25/2023 Start Time: 1930 End Time: 2000 Facilitators: Carollynn Genre, NT  Department: [facility]  Number of Participants: 4 Group Focus: anxiety and check in Treatment Modality:  Individual Therapy Interventions utilized were story telling Purpose: express feelings  Name: Cassidy Underwood [facility] Date of Birth: 05-10-1995  MR: [id number]    Level of Participation:  did not attend Quality of Participation: N a Interactions with others: N A Mood/Affect: n a Triggers (if applicable): n a Cognition: N a Progress: None Response: none Plan: patient will be encouraged to attend go groups  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   Polysubstance dependence (HCC) 05/18/2023   Recurrent major depressive disorder (HCC) 04/02/2023   Rhabdomyolysis 06/25/2021   HO Drug overdose, accidental or unintentional 06/25/2021   AKI (acute kidney injury) (HCC) 06/25/2021   Leukocytosis 06/25/2021   Elevated LFTs 06/25/2021   Hyperglycemia 06/25/2021   Hearing loss 06/25/2021   [redacted] weeks gestation of pregnancy 12/17/2018   Normal labor 12/16/2018   Substance induced mood disorder (HCC)    Cocaine use disorder, severe, dependence (HCC) 02/22/2018   Major depressive disorder, single episode, severe without psychotic features (HCC) 02/22/2018   Major depressive disorder, single episode, severe without psychosis (HCC) 02/22/2018   Indication for care in labor or delivery 11/13/2017   Pregnancy with adoption planned 10/05/2017   IUGR (intrauterine growth restriction) affecting care of mother 10/05/2017   Supervision of high risk  pregnancy, antepartum 08/02/2017   Polysubstance abuse (HCC) 07/29/2017   Homelessness 11/02/2016

## 2023-10-26 NOTE — ED Notes (Signed)
 Pt presents at nurses station sharing many somatic complaints which pt had received prn medications addressing complaints at end of night shift. Pt requesting suboxone .   Informed patient to let provider know this am Pt reports feeling tired and shaky and anxious as well. Denied current SI plan and intent.  Denied HI and A/V hallucination Q 15 minute observation for safety continue

## 2023-10-26 NOTE — ED Notes (Signed)
 Pt noted attending group.

## 2023-10-26 NOTE — ED Notes (Signed)
 Pt has rescinded 72 hour request for discharge  at 1440.

## 2023-10-26 NOTE — ED Notes (Addendum)
 MHT went over to pt when she was taking meds and got her cup (trash) from her to throw away and I noticed a pill was in there so I showed it to [place] the RN on shift.

## 2023-10-27 DIAGNOSIS — F1124 Opioid dependence with opioid-induced mood disorder: Secondary | ICD-10-CM | POA: Diagnosis not present

## 2023-10-27 DIAGNOSIS — F4310 Post-traumatic stress disorder, unspecified: Secondary | ICD-10-CM | POA: Diagnosis not present

## 2023-10-27 DIAGNOSIS — F17210 Nicotine dependence, cigarettes, uncomplicated: Secondary | ICD-10-CM | POA: Diagnosis not present

## 2023-10-27 DIAGNOSIS — F319 Bipolar disorder, unspecified: Secondary | ICD-10-CM | POA: Diagnosis not present

## 2023-10-27 MED ORDER — BUPRENORPHINE HCL-NALOXONE HCL 8-2 MG SL SUBL
1.0000 | SUBLINGUAL_TABLET | Freq: Two times a day (BID) | SUBLINGUAL | Status: DC
  Administered 2023-10-28: 1 via SUBLINGUAL
  Filled 2023-10-27: qty 1

## 2023-10-27 MED ORDER — BUPRENORPHINE HCL-NALOXONE HCL 2-0.5 MG SL SUBL
1.0000 | SUBLINGUAL_TABLET | Freq: Two times a day (BID) | SUBLINGUAL | Status: AC
Start: 2023-10-27 — End: 2023-10-27
  Administered 2023-10-27: 1 via SUBLINGUAL
  Filled 2023-10-27: qty 1

## 2023-10-27 NOTE — ED Notes (Signed)
 Pt wanted to leave.Reiterated to patient the Dispo  that is currently in place  to discharge Monday. With ACTT team members.  Pt was frustrated and upset that she would not be able to leave,  However, she was able to regulate her emotions at this time with much encouragement.
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
## 2023-10-27 NOTE — Group Note (Signed)
 Group Topic: Balance in Life  Group Date: 10/27/2023 Start Time: 1000 End Time: 1030 Facilitators: Carletha Iha, RN  Department: [facility]  Number of Participants: 4  Group Focus: personal responsibility Treatment Modality:  Psychoeducation Interventions utilized were problem solving Purpose: enhance coping skills  Name: Cassidy Underwood [facility] Date of Birth: March 04, 1995  MR: [id number]    Level of Participation:  did not attend Quality of Participation:  Interactions with others:  Mood/Affect:  Triggers (if applicable):  Cognition:  Progress:  Response:  Plan:   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   Polysubstance dependence (HCC) 05/18/2023   Recurrent major depressive disorder (HCC) 04/02/2023   Rhabdomyolysis 06/25/2021   HO Drug overdose, accidental or unintentional 06/25/2021   AKI (acute kidney injury) (HCC) 06/25/2021   Leukocytosis 06/25/2021   Elevated LFTs 06/25/2021   Hyperglycemia 06/25/2021   Hearing loss 06/25/2021   [redacted] weeks gestation of pregnancy 12/17/2018   Normal labor 12/16/2018   Substance induced mood disorder (HCC)    Cocaine use disorder, severe, dependence (HCC) 02/22/2018   Major depressive disorder, single episode, severe without psychotic features (HCC) 02/22/2018   Major depressive disorder, single episode, severe without psychosis (HCC) 02/22/2018   Indication for care in labor or delivery 11/13/2017   Pregnancy with adoption planned 10/05/2017   IUGR (intrauterine growth restriction) affecting care of mother 10/05/2017   Supervision of high risk pregnancy, antepartum 08/02/2017   Polysubstance  abuse (HCC) 07/29/2017   Homelessness 11/02/2016

## 2023-10-27 NOTE — ED Notes (Signed)
 Pt states that she wants to be discharged.   Went over current POC and that her discharge plan is be released on Monday. Irritable.  Pt states I want to go home, I don't want to go to long term care.  Pt further states, this is bullsh*t.  I checked myself in, I can check myself out!

## 2023-10-27 NOTE — ED Notes (Signed)
 Pt presented at nurses station,  Bright affect.  Reports sleeping well last night. Q 15 minute observations for safety continue

## 2023-10-27 NOTE — ED Notes (Signed)
 Patient asleep, no COWS assessment completed at this time.

## 2023-10-27 NOTE — ED Notes (Signed)
 Pt continually asking about leaving. Pt stated she only came here to get off of heroin and she is off of that now She said she does not long term treatment. she states she does not want to be on suboxone  anymore. Pt stated that she will always smoke crack and that she does not want to stop using crack,   Identified with patient that she having pretty severe cravings at present.  Pt received atarax  25 mg po. Providers made aware

## 2023-10-27 NOTE — ED Notes (Signed)
 Patient is in the bedroom sleeping, NAD.

## 2023-10-27 NOTE — ED Notes (Signed)
 Pt is calm, cooperative, sitting in the day room. Pt denied SI/HI/AVH, said she had no complaints other than her room being a bit too cold for her and requested the temperature to be adjusted. Staff made the adjustment. Staff will continue to monitor pt for safety.

## 2023-10-27 NOTE — Group Note (Signed)
 Group Topic: Relapse and Recovery  Group Date: 10/27/2023 Start Time: 2000 End Time: 2100 Facilitators: Joan Plowman B  Department: [facility]  Number of Participants: 2  Group Focus: abuse issues and coping skills Treatment Modality:  Individual Therapy and Spiritual Interventions utilized were leisure development Purpose: enhance coping skills, increase insight, relapse prevention strategies, and trigger / craving management  Name: Cassidy Underwood [facility] Date of Birth: 05-24-1995  MR: [id number]    Level of Participation: active Quality of Participation: cooperative Interactions with others: gave feedback Mood/Affect: appropriate Triggers (if applicable): NA Cognition: coherent/clear Progress: Gaining insight Response: NA Plan: patient will be encouraged to attend groups   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   Polysubstance dependence (HCC) 05/18/2023   Recurrent major depressive disorder (HCC) 04/02/2023   Rhabdomyolysis 06/25/2021   HO Drug overdose, accidental or unintentional 06/25/2021   AKI (acute kidney injury) (HCC) 06/25/2021   Leukocytosis 06/25/2021   Elevated LFTs 06/25/2021   Hyperglycemia 06/25/2021   Hearing loss 06/25/2021   [redacted] weeks gestation of pregnancy 12/17/2018   Normal labor 12/16/2018   Substance induced mood disorder (HCC)    Cocaine use disorder, severe, dependence (HCC) 02/22/2018   Major depressive disorder, single episode, severe without psychotic features (HCC) 02/22/2018   Major depressive disorder, single episode, severe without psychosis (HCC) 02/22/2018   Indication for care in labor or delivery  11/13/2017   Pregnancy with adoption planned 10/05/2017   IUGR (intrauterine growth restriction) affecting care of mother 10/05/2017   Supervision of high risk pregnancy, antepartum 08/02/2017   Polysubstance abuse (HCC) 07/29/2017   Homelessness 11/02/2016

## 2023-10-27 NOTE — Group Note (Signed)
 Group Topic: Relapse and Recovery  Group Date: 10/27/2023 Start Time: 1545 End Time: 1615 Facilitators: Laneta Renea POUR, NT  Department: [facility]  Number of Participants: 2  Group Focus: relaxation Treatment Modality:  Psychoeducation Interventions utilized were patient education and problem solving Purpose: enhance coping skills  Name: Cassidy Underwood Date of Birth: 10/25/1995  MR: [id number]    Level of Participation: Decline Quality of Participation: N/A Interactions with others: N/A Mood/Affect: N/A Triggers (if applicable): None Cognition: none Progress: None Response: none Plan: patient will be encouraged to attend group.   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   Polysubstance dependence (HCC) 05/18/2023   Recurrent major depressive disorder (HCC) 04/02/2023   Rhabdomyolysis 06/25/2021   HO Drug overdose, accidental or unintentional 06/25/2021   AKI (acute kidney injury) (HCC) 06/25/2021   Leukocytosis 06/25/2021   Elevated LFTs 06/25/2021   Hyperglycemia 06/25/2021   Hearing loss 06/25/2021   [redacted] weeks gestation of pregnancy 12/17/2018   Normal labor 12/16/2018   Substance induced mood disorder (HCC)    Cocaine use disorder, severe, dependence (HCC) 02/22/2018   Major depressive disorder, single episode, severe without psychotic features (HCC) 02/22/2018   Major depressive disorder, single episode, severe without psychosis (HCC) 02/22/2018   Indication for care in labor or delivery 11/13/2017   Pregnancy with adoption planned 10/05/2017   IUGR (intrauterine growth restriction) affecting care of mother 10/05/2017    Supervision of high risk pregnancy, antepartum 08/02/2017   Polysubstance abuse (HCC) 07/29/2017   Homelessness 11/02/2016

## 2023-10-28 DIAGNOSIS — F1124 Opioid dependence with opioid-induced mood disorder: Secondary | ICD-10-CM | POA: Diagnosis not present

## 2023-10-28 DIAGNOSIS — F319 Bipolar disorder, unspecified: Secondary | ICD-10-CM | POA: Diagnosis not present

## 2023-10-28 DIAGNOSIS — F17210 Nicotine dependence, cigarettes, uncomplicated: Secondary | ICD-10-CM | POA: Diagnosis not present

## 2023-10-28 DIAGNOSIS — F4310 Post-traumatic stress disorder, unspecified: Secondary | ICD-10-CM | POA: Diagnosis not present

## 2023-10-28 MED ORDER — BUPRENORPHINE HCL-NALOXONE HCL 8-2 MG SL SUBL
1.0000 | SUBLINGUAL_TABLET | Freq: Every day | SUBLINGUAL | Status: DC
  Administered 2023-10-29: 1 via SUBLINGUAL
  Filled 2023-10-28: qty 1

## 2023-10-28 MED ORDER — MIRTAZAPINE 7.5 MG PO TABS
7.5000 mg | ORAL_TABLET | Freq: Every day | ORAL | Status: DC
  Administered 2023-10-28: 7.5 mg via ORAL
  Filled 2023-10-28: qty 1

## 2023-10-28 NOTE — ED Notes (Signed)
 Patient is in the Dayroom calm and composed watching TV with other patients. NAD.  Environment secured per policy. Will monitor for safety.

## 2023-10-28 NOTE — Group Note (Signed)
 Group Topic: Emotional Regulation  Group Date: 10/28/2023 Start Time: 2000 End Time: 2100 Facilitators: Luvenia Mae SAUNDERS, NT  Department: [facility]  Number of Participants: 9  Group Focus: self-awareness Treatment Modality:  Leisure Development Interventions utilized were assignment Purpose: express feelings  Name: Cassidy Underwood [facility] Date of Birth: [birth year]  MR: [id number]    Level of Participation: active Quality of Participation: attentive and cooperative Interactions with others: gave feedback Mood/Affect: appropriate Triggers (if applicable): none Cognition: coherent/clear Progress: Gaining insight Response: na Plan: patient will be encouraged to go to group  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   Polysubstance dependence (HCC) 05/18/2023   Recurrent major depressive disorder (HCC) 04/02/2023   Rhabdomyolysis 06/25/2021   HO Drug overdose, accidental or unintentional 06/25/2021   AKI (acute kidney injury) (HCC) 06/25/2021   Leukocytosis 06/25/2021   Elevated LFTs 06/25/2021   Hyperglycemia 06/25/2021   Hearing loss 06/25/2021   [redacted] weeks gestation of pregnancy 12/17/2018   Normal labor 12/16/2018   Substance induced mood disorder (HCC)    Cocaine use disorder, severe, dependence (HCC) 02/22/2018   Major depressive disorder, single episode, severe without psychotic features (HCC) 02/22/2018   Major depressive disorder, single episode, severe without psychosis (HCC) 02/22/2018   Indication for care in labor or delivery 11/13/2017   Pregnancy with adoption planned 10/05/2017   IUGR (intrauterine growth restriction) affecting  care of mother 10/05/2017   Supervision of high risk pregnancy, antepartum 08/02/2017   Polysubstance abuse (HCC) 07/29/2017   Homelessness 11/02/2016

## 2023-10-28 NOTE — ED Notes (Signed)
 Pt presented at nurses station. Bright affect.  Reported sleeping well last night.  No other needs identified at present. Q 15 minute observations for safety continue

## 2023-10-28 NOTE — ED Notes (Signed)
 Patient is in the bedroom sleeping NAD. Will continue to monitor for safety.

## 2023-10-28 NOTE — ED Notes (Signed)
 Pt is asleep in her room, respirations even and unlabored. No objective withdrawal symptoms nor distress noted at this time. COWS will be done when pt is awake. Staff will continue to monitor pt for safety.

## 2023-10-28 NOTE — Group Note (Signed)
 Group Topic: Understanding Self  Group Date: 10/28/2023 Start Time: 1430 End Time: 1500 Facilitators: Laneta Renea POUR, NT  Department: [facility]  Number of Participants: 3  Group Focus: coping skills Treatment Modality:  Psychoeducation Interventions utilized were problem solving Purpose: enhance coping skills and increase insight  Name: Cassidy Underwood [facility] Date of Birth: [birth year]  MR: [id number]    Level of Participation: active Quality of Participation: cooperative and engaged Interactions with others: gave feedback Mood/Affect: appropriate and bright Triggers (if applicable): none Cognition: coherent/clear and insightful Progress: Gaining insight Response:  I'm glad I did this, but I am ready to go home.  Plan: patient will be encouraged to attend groups.   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   Polysubstance dependence (HCC) 05/18/2023   Recurrent major depressive disorder (HCC) 04/02/2023   Rhabdomyolysis 06/25/2021   HO Drug overdose, accidental or unintentional 06/25/2021   AKI (acute kidney injury) (HCC) 06/25/2021   Leukocytosis 06/25/2021   Elevated LFTs 06/25/2021   Hyperglycemia 06/25/2021   Hearing loss 06/25/2021   [redacted] weeks gestation of pregnancy 12/17/2018   Normal labor 12/16/2018   Substance induced mood disorder (HCC)    Cocaine use disorder, severe, dependence (HCC) 02/22/2018   Major depressive disorder, single episode, severe without psychotic features (HCC) 02/22/2018   Major depressive disorder, single episode, severe without psychosis (HCC) 02/22/2018   Indication for care in labor or delivery  11/13/2017   Pregnancy with adoption planned 10/05/2017   IUGR (intrauterine growth restriction) affecting care of mother 10/05/2017   Supervision of high risk pregnancy, antepartum 08/02/2017   Polysubstance abuse (HCC) 07/29/2017   Homelessness 11/02/2016

## 2023-10-28 NOTE — ED Provider Notes (Signed)
 Behavioral Health Progress Note  Date and Time: 10/28/2023 8:50 AM Name: Cassidy Underwood [facility] MRN:  [id number]  Subjective:  Cassidy Underwood is a 28 year old female patient who was admitted to the [facility] on 10/24/2023 requesting detox from opioids. She was accompanied by her peer support specialist and case manager. Patient has a documented history of MDD, PTSD, ODD, substance-induced mood disorder, drug overdose, and polysubstance abuse. Patient reported abusing heroin for the past 2 years and reported using $80 daily, by snorting and smoking. She reported using crack/cocaine for several years. Urine drug screen on arrival was positive for cocaine, methamphetamine and morphine . BAL was negative.   Upon evaluation, pt  observed sitting in dayroom watching TV.   Patient presents with a much brighter affect and reports having good appetite but difficulty falling asleep last night due to feeling anxious and having racing thoughts overnight.  She was encouraged to request as needed medication from nursing for anxiety and sleep medications to help her with this at nighttime.   Patient reports that she is feeling much better today and denies having withdrawal symptoms at this time.  She does report wanting to stay off of heroin and has also decided that she would like to start using crack cocaine as well.  She reports having a history of MDD and inquires about starting a medication for depression to see if that will also help her stop using substances.  Patient did speak with Lynnie Stacks and initially asked her to come pick her up today instead of in the morning.  After reprocessing and educating patient she did agree to stay 1 more day with the plan to discharge in the morning to Lynnie Stacks with a plan to transfer her to a residential substance abuse program.  Patient denied having any suicidal ideations, homicidal ideations or psychotic symptoms.  Suboxone  was increased  and had her first dose today, denies any side effects or complaints from this medication change.  Patient is unsure if she can commit to a residential program but will talk with her peer support specialist about this tomorrow at discharge.  I also spoke with patient's peer support specialist Marguerita Der [phone]), who our social work team has been coordinating discharge with.  She states that patient did call her requesting to come pick her up and she initially agreed but would like to encourage patient stay 1 more night and follow through with plan for discharge in the morning as resources were already set up for tomorrow morning.  I spoke with patient who agreed to stay overnight and informed her support specialist of this.  Plan is for the patient to still be discharged in the morning to Lynnie Stacks with plan for her to receive residential substance abuse treatment.   Diagnosis:  Final diagnoses:  Polysubstance abuse (HCC)  Cocaine abuse (HCC)  Methamphetamine use (HCC)  Opioid use with withdrawal (HCC)    Total Time spent with patient: 45 minutes  Past Psychiatric History: Patient has a documented history of MDD, PTSD, ODD, substance-induced mood disorder, drug overdose, and polysubstance abuse.    Past Medical History: Per chart review, history of hearing loss, and elevated liver enzymes.   Family Psychiatric  History: Patient adopted and has no known history of biological parents/family.   Social History: Homelessness.  Adopted. Patient is unemployed and has no source of income. History of heroin, crack methamphetamine, and fentanyl  use  Additional Social History:  Sleep: Good  Appetite:  Good  Current Medications:  Current Facility-Administered Medications  Medication Dose Route Frequency Provider Last Rate Last Admin   acetaminophen  (TYLENOL ) tablet 650 mg  650 mg Oral Q6H PRN Tex Drilling, NP   650 mg at 10/25/23 1445   alum & mag  hydroxide-simeth (MAALOX/MYLANTA) 200-200-20 MG/5ML suspension 30 mL  30 mL Oral Q4H PRN Tex Drilling, NP       buprenorphine -naloxone  (SUBOXONE ) 8-2 mg per SL tablet 1 tablet  1 tablet Sublingual BID Timesha Cervantez C, NP       dicyclomine  (BENTYL ) tablet 20 mg  20 mg Oral Q6H PRN Tex Drilling, NP   20 mg at 10/25/23 1446   haloperidol  (HALDOL ) tablet 5 mg  5 mg Oral TID PRN Tex Drilling, NP   5 mg at 10/27/23 1436   And   diphenhydrAMINE  (BENADRYL ) capsule 50 mg  50 mg Oral TID PRN Tex Drilling, NP   50 mg at 10/27/23 1436   haloperidol  lactate (HALDOL ) injection 10 mg  10 mg Intramuscular TID PRN Tex Drilling, NP       And   diphenhydrAMINE  (BENADRYL ) injection 50 mg  50 mg Intramuscular TID PRN Tex Drilling, NP       And   LORazepam  (ATIVAN ) injection 2 mg  2 mg Intramuscular TID PRN Tex Drilling, NP       haloperidol  lactate (HALDOL ) injection 5 mg  5 mg Intramuscular TID PRN Tex Drilling, NP       And   diphenhydrAMINE  (BENADRYL ) injection 50 mg  50 mg Intramuscular TID PRN Tex Drilling, NP       And   LORazepam  (ATIVAN ) injection 2 mg  2 mg Intramuscular TID PRN Tex Drilling, NP       hydrOXYzine  (ATARAX ) tablet 25 mg  25 mg Oral TID PRN Tex Drilling, NP   25 mg at 10/27/23 1813   loperamide  (IMODIUM ) capsule 2-4 mg  2-4 mg Oral PRN Tex Drilling, NP       magnesium  hydroxide (MILK OF MAGNESIA) suspension 30 mL  30 mL Oral Daily PRN Nkwenti, Doris, NP       methocarbamol  (ROBAXIN ) tablet 500 mg  500 mg Oral Q8H PRN Nkwenti, Doris, NP   500 mg at 10/26/23 1309   naproxen  (NAPROSYN ) tablet 500 mg  500 mg Oral BID PRN Tex Drilling, NP   500 mg at 10/26/23 2036   nicotine  (NICODERM CQ  - dosed in mg/24 hours) patch 21 mg  21 mg Transdermal Q0600 Tex Drilling, NP   21 mg at 10/28/23 0640   ondansetron  (ZOFRAN -ODT) disintegrating tablet 4 mg  4 mg Oral Q6H PRN Tex Drilling, NP       traZODone  (DESYREL ) tablet 100 mg  100 mg Oral QHS PRN White, Patrice L, NP   100 mg  at 10/27/23 2111   No current outpatient medications on file.    Labs  Lab Results:  Admission on 10/24/2023, Discharged on 10/24/2023  Component Date Value Ref Range Status   WBC 10/24/2023 8.9  4.0 - 10.5 K/uL Final   RBC 10/24/2023 4.27  3.87 - 5.11 MIL/uL Final   Hemoglobin 10/24/2023 12.4  12.0 - 15.0 g/dL Final   HCT 90/89/7974 37.3  36.0 - 46.0 % Final   MCV 10/24/2023 87.4  80.0 - 100.0 fL Final   MCH 10/24/2023 29.0  26.0 - 34.0 pg Final   MCHC 10/24/2023 33.2  30.0 - 36.0 g/dL Final   RDW 90/89/7974 11.9  11.5 -  15.5 % Final   Platelets 10/24/2023 330  150 - 400 K/uL Final   nRBC 10/24/2023 0.0  0.0 - 0.2 % Final   Neutrophils Relative % 10/24/2023 50  % Final   Neutro Abs 10/24/2023 4.4  1.7 - 7.7 K/uL Final   Lymphocytes Relative 10/24/2023 42  % Final   Lymphs Abs 10/24/2023 3.7  0.7 - 4.0 K/uL Final   Monocytes Relative 10/24/2023 5  % Final   Monocytes Absolute 10/24/2023 0.5  0.1 - 1.0 K/uL Final   Eosinophils Relative 10/24/2023 3  % Final   Eosinophils Absolute 10/24/2023 0.3  0.0 - 0.5 K/uL Final   Basophils Relative 10/24/2023 0  % Final   Basophils Absolute 10/24/2023 0.0  0.0 - 0.1 K/uL Final   Immature Granulocytes 10/24/2023 0  % Final   Abs Immature Granulocytes 10/24/2023 0.03  0.00 - 0.07 K/uL Final   Performed at [facility], [address] [place], [place] [address]   Sodium 10/24/2023 139  135 - 145 mmol/L Final   Potassium 10/24/2023 3.7  3.5 - 5.1 mmol/L Final   Chloride 10/24/2023 102  98 - 111 mmol/L Final   CO2 10/24/2023 29  22 - 32 mmol/L Final   Glucose, Bld 10/24/2023 72  70 - 99 mg/dL Final   Glucose reference range applies only to samples taken after fasting for at least 8 hours.   BUN 10/24/2023 8  6 - 20 mg/dL Final   Creatinine, Ser 10/24/2023 0.66  0.44 - 1.00 mg/dL Final   Calcium  10/24/2023 8.9  8.9 - 10.3 mg/dL Final   Total Protein 90/89/7974 5.7 (L)  6.5 - 8.1 g/dL Final   Albumin 90/89/7974 3.3 (L)  3.5 - 5.0 g/dL  Final   AST 90/89/7974 19  15 - 41 U/L Final   ALT 10/24/2023 14  0 - 44 U/L Final   Alkaline Phosphatase 10/24/2023 45  38 - 126 U/L Final   Total Bilirubin 10/24/2023 0.5  0.0 - 1.2 mg/dL Final   GFR, Estimated 10/24/2023 >60  >60 mL/min Final   Comment: (NOTE) Calculated using the CKD-EPI Creatinine Equation (2021)    Anion gap 10/24/2023 8  5 - 15 Final   Performed at [facility], [address] [place], [place] [address]   Hgb A1c MFr Bld 10/24/2023 5.2  4.8 - 5.6 % Final   Comment: (NOTE) Diagnosis of Diabetes The following HbA1c ranges recommended by the American Diabetes Association (ADA) may be used as an aid in the diagnosis of diabetes mellitus.  Hemoglobin             Suggested A1C NGSP%              Diagnosis  <5.7                   Non Diabetic  5.7-6.4                Pre-Diabetic  >6.4                   Diabetic  <7.0                   Glycemic control for                       adults with diabetes.     Mean Plasma Glucose 10/24/2023 102.54  mg/dL Final   Performed at [facility], [address] [place], [place] [address]   Magnesium  10/24/2023 1.9  1.7 - 2.4 mg/dL Final   Performed at [facility], [address] [place], [place] [address]   Alcohol, Ethyl (B) 10/24/2023 <15  <15 mg/dL Final   Comment: (NOTE) For medical purposes only. Performed at [facility], [address] [place], [place] [address]    Cholesterol 10/24/2023 110  0 - 200 mg/dL Final   Triglycerides 90/89/7974 39  <150 mg/dL Final   HDL 90/89/7974 48  >40 mg/dL Final   Total CHOL/HDL Ratio 10/24/2023 2.3  RATIO Final   VLDL 10/24/2023 8  0 - 40 mg/dL Final   LDL Cholesterol 10/24/2023 54  0 - 99 mg/dL Final   Comment:        Total Cholesterol/HDL:CHD Risk Coronary Heart Disease Risk Table                     Men   Women  1/2 Average Risk   3.4   3.3  Average Risk       5.0   4.4  2 X Average Risk   9.6   7.1  3 X Average Risk  23.4   11.0         Use the calculated Patient Ratio above and the CHD Risk Table to determine the patient's CHD Risk.        ATP III CLASSIFICATION (LDL):  <100     mg/dL   Optimal  899-870  mg/dL   Near or Above                    Optimal  130-159  mg/dL   Borderline  839-810  mg/dL   High  >809     mg/dL   Very High Performed at [facility], [address], [place], [place] [address]    TSH 10/24/2023 1.765  0.350 - 4.500 uIU/mL Final   Comment: Performed by a 3rd Generation assay with a functional sensitivity of <=0.01 uIU/mL. Performed at [facility], [address], [place], [place] [address]    Color, Urine 10/24/2023 YELLOW  YELLOW Final   APPearance 10/24/2023 HAZY (A)  CLEAR Final   Specific Gravity, Urine 10/24/2023 1.024  1.005 - 1.030 Final   pH 10/24/2023 5.0  5.0 - 8.0 Final   Glucose, UA 10/24/2023 NEGATIVE  NEGATIVE mg/dL Final   Hgb urine dipstick 10/24/2023 SMALL (A)  NEGATIVE Final   Bilirubin Urine 10/24/2023 NEGATIVE  NEGATIVE Final   Ketones, ur 10/24/2023 NEGATIVE  NEGATIVE mg/dL Final   Protein, ur 90/89/7974 NEGATIVE  NEGATIVE mg/dL Final   Nitrite 90/89/7974 NEGATIVE  NEGATIVE Final   Leukocytes,Ua 10/24/2023 NEGATIVE  NEGATIVE Final   RBC / HPF 10/24/2023 0-5  0 - 5 RBC/hpf Final   WBC, UA 10/24/2023 0-5  0 - 5 WBC/hpf Final   Bacteria, UA 10/24/2023 NONE SEEN  NONE SEEN Final   Squamous Epithelial / HPF 10/24/2023 0-5  0 - 5 /HPF Final   Mucus 10/24/2023 PRESENT   Final   Ca Oxalate Crys, UA 10/24/2023 PRESENT   Final   Performed at [facility], [address], [place], [place] [address]   POC Amphetamine UR 10/24/2023 None Detected  NONE DETECTED (Cut Off Level 1000 ng/mL) Final   POC Secobarbital (BAR) 10/24/2023 None Detected  NONE DETECTED (Cut Off Level 300 ng/mL) Final   POC Buprenorphine  (BUP) 10/24/2023 None Detected  NONE DETECTED (Cut Off Level 10 ng/mL) Final   POC Oxazepam (BZO) 10/24/2023 None Detected  NONE DETECTED (Cut Off Level  300 ng/mL) Final   POC Cocaine UR 10/24/2023 Positive (A)  NONE DETECTED (Cut Off Level 300 ng/mL) Final   POC Methamphetamine UR 10/24/2023 Positive (A)  NONE DETECTED (Cut Off Level 1000 ng/mL) Final   POC Morphine  10/24/2023 Positive (A)  NONE DETECTED (Cut Off Level 300 ng/mL) Final   POC Methadone UR 10/24/2023 None Detected  NONE DETECTED (Cut Off Level 300 ng/mL) Final   POC Oxycodone  UR 10/24/2023 None Detected  NONE DETECTED (Cut Off Level 100 ng/mL) Final   POC Marijuana UR 10/24/2023 None Detected  NONE DETECTED (Cut Off Level 50 ng/mL) Final   Preg Test, Ur 10/24/2023 Negative  Negative Final   Vit D, 25-Hydroxy 10/24/2023 45.29  30 - 100 ng/mL Final   Comment: (NOTE) Vitamin D  deficiency has been defined by the Institute of Medicine  and an Endocrine Society practice guideline as a level of serum 25-OH  vitamin D  less than 20 ng/mL (1,2). The Endocrine Society went on to  further define vitamin D  insufficiency as a level between 21 and 29  ng/mL (2).  1. IOM (Institute of Medicine). 2010. Dietary reference intakes for  calcium  and D. [place]: The [employer]. 2. Holick MF, Binkley [place], Bischoff-Ferrari HA, et al. Evaluation,  treatment, and prevention of vitamin D  deficiency: an Endocrine  Society clinical practice guideline, JCEM. 2011 Jul; 96(7): 1911-30.  Performed at [facility], [address], [place], [place] [address]    Vitamin B-12 10/24/2023 230  180 - 914 pg/mL Final   Comment: (NOTE) This assay is not validated for testing neonatal or myeloproliferative syndrome specimens for Vitamin B12 levels. Performed at [facility], [address], [place], [place] [address]   Admission on 05/17/2023, Discharged on 05/17/2023  Component Date Value Ref Range Status   WBC 05/17/2023 9.5  4.0 - 10.5 K/uL Final   RBC 05/17/2023 4.29  3.87 - 5.11 MIL/uL Final   Hemoglobin 05/17/2023 12.7  12.0 - 15.0 g/dL Final   HCT 95/96/7974 37.9  36.0  - 46.0 % Final   MCV 05/17/2023 88.3  80.0 - 100.0 fL Final   MCH 05/17/2023 29.6  26.0 - 34.0 pg Final   MCHC 05/17/2023 33.5  30.0 - 36.0 g/dL Final   RDW 95/96/7974 12.2  11.5 - 15.5 % Final   Platelets 05/17/2023 355  150 - 400 K/uL Final   nRBC 05/17/2023 0.0  0.0 - 0.2 % Final   Neutrophils Relative % 05/17/2023 49  % Final   Neutro Abs 05/17/2023 4.6  1.7 - 7.7 K/uL Final   Lymphocytes Relative 05/17/2023 41  % Final   Lymphs Abs 05/17/2023 3.9  0.7 - 4.0 K/uL Final   Monocytes Relative 05/17/2023 6  % Final   Monocytes Absolute 05/17/2023 0.6  0.1 - 1.0 K/uL Final   Eosinophils Relative 05/17/2023 4  % Final   Eosinophils Absolute 05/17/2023 0.3  0.0 - 0.5 K/uL Final   Basophils Relative 05/17/2023 0  % Final   Basophils Absolute 05/17/2023 0.0  0.0 - 0.1 K/uL Final   Immature Granulocytes 05/17/2023 0  % Final   Abs Immature Granulocytes 05/17/2023 0.04  0.00 - 0.07 K/uL Final   Performed at [facility], [address], [place], [place] [address]   Sodium 05/17/2023 141  135 - 145 mmol/L Final  Potassium 05/17/2023 4.5  3.5 - 5.1 mmol/L Final   Chloride 05/17/2023 106  98 - 111 mmol/L Final   CO2 05/17/2023 25  22 - 32 mmol/L Final   Glucose, Bld 05/17/2023 84  70 - 99 mg/dL Final   Glucose reference range applies only to samples taken after fasting for at least 8 hours.   BUN 05/17/2023 8  6 - 20 mg/dL Final   Creatinine, Ser 05/17/2023 0.77  0.44 - 1.00 mg/dL Final   Calcium  05/17/2023 9.0  8.9 - 10.3 mg/dL Final   Total Protein 95/96/7974 6.3 (L)  6.5 - 8.1 g/dL Final   Albumin 95/96/7974 3.5  3.5 - 5.0 g/dL Final   AST 95/96/7974 22  15 - 41 U/L Final   ALT 05/17/2023 16  0 - 44 U/L Final   Alkaline Phosphatase 05/17/2023 57  38 - 126 U/L Final   Total Bilirubin 05/17/2023 0.6  0.0 - 1.2 mg/dL Final   GFR, Estimated 05/17/2023 >60  >60 mL/min Final   Comment: (NOTE) Calculated using the CKD-EPI Creatinine Equation (2021)    Anion gap 05/17/2023 10  5 - 15  Final   Performed at [facility], [address] [place], [place] [address]   Hgb A1c MFr Bld 05/17/2023 5.2  4.8 - 5.6 % Final   Comment: (NOTE) Pre diabetes:          5.7%-6.4%  Diabetes:              >6.4%  Glycemic control for   <7.0% adults with diabetes    Mean Plasma Glucose 05/17/2023 102.54  mg/dL Final   Performed at [facility], [address], [place], [place] [address]   Alcohol, Ethyl (B) 05/17/2023 <10  <10 mg/dL Final   Comment: (NOTE) Lowest detectable limit for serum alcohol is 10 mg/dL.  For medical purposes only. Performed at [facility], [address], [place], [place] [address]    Cholesterol 05/17/2023 142  0 - 200 mg/dL Final   Triglycerides 95/96/7974 237 (H)  <150 mg/dL Final   HDL 95/96/7974 59  >40 mg/dL Final   Total CHOL/HDL Ratio 05/17/2023 2.4  RATIO Final   VLDL 05/17/2023 47 (H)  0 - 40 mg/dL Final   LDL Cholesterol 05/17/2023 36  0 - 99 mg/dL Final   Comment:        Total Cholesterol/HDL:CHD Risk Coronary Heart Disease Risk Table                     Men   Women  1/2 Average Risk   3.4   3.3  Average Risk       5.0   4.4  2 X Average Risk   9.6   7.1  3 X Average Risk  23.4   11.0        Use the calculated Patient Ratio above and the CHD Risk Table to determine the patient's CHD Risk.        ATP III CLASSIFICATION (LDL):  <100     mg/dL   Optimal  899-870  mg/dL   Near or Above                    Optimal  130-159  mg/dL   Borderline  839-810  mg/dL   High  >809     mg/dL   Very High Performed at [facility], [address], [place], [place] [address]  TSH 05/17/2023 0.589  0.350 - 4.500 uIU/mL Final   Comment: Performed by a 3rd Generation assay with a functional sensitivity of <=0.01 uIU/mL. Performed at [facility], [address], [place], [place] [address]    Color, Urine 05/17/2023 YELLOW  YELLOW Final   APPearance 05/17/2023 CLEAR  CLEAR Final   Specific Gravity, Urine 05/17/2023  1.025  1.005 - 1.030 Final   pH 05/17/2023 6.0  5.0 - 8.0 Final   Glucose, UA 05/17/2023 NEGATIVE  NEGATIVE mg/dL Final   Hgb urine dipstick 05/17/2023 NEGATIVE  NEGATIVE Final   Bilirubin Urine 05/17/2023 NEGATIVE  NEGATIVE Final   Ketones, ur 05/17/2023 NEGATIVE  NEGATIVE mg/dL Final   Protein, ur 95/96/7974 NEGATIVE  NEGATIVE mg/dL Final   Nitrite 95/96/7974 NEGATIVE  NEGATIVE Final   Leukocytes,Ua 05/17/2023 NEGATIVE  NEGATIVE Final   Comment: Microscopic not done on urines with negative protein, blood, leukocytes, nitrite, or glucose < 500 mg/dL. Performed at [facility], [address] [place], [place] [address]    Preg Test, Ur 05/17/2023 Negative  Negative Final   POC Amphetamine UR 05/17/2023 None Detected  NONE DETECTED (Cut Off Level 1000 ng/mL) Final   POC Secobarbital (BAR) 05/17/2023 None Detected  NONE DETECTED (Cut Off Level 300 ng/mL) Final   POC Buprenorphine  (BUP) 05/17/2023 None Detected  NONE DETECTED (Cut Off Level 10 ng/mL) Final   POC Oxazepam (BZO) 05/17/2023 None Detected  NONE DETECTED (Cut Off Level 300 ng/mL) Final   POC Cocaine UR 05/17/2023 Positive (A)  NONE DETECTED (Cut Off Level 300 ng/mL) Final   POC Methamphetamine UR 05/17/2023 None Detected  NONE DETECTED (Cut Off Level 1000 ng/mL) Final   POC Morphine  05/17/2023 Positive (A)  NONE DETECTED (Cut Off Level 300 ng/mL) Final   POC Methadone UR 05/17/2023 None Detected  NONE DETECTED (Cut Off Level 300 ng/mL) Final   POC Oxycodone  UR 05/17/2023 None Detected  NONE DETECTED (Cut Off Level 100 ng/mL) Final   POC Marijuana UR 05/17/2023 None Detected  NONE DETECTED (Cut Off Level 50 ng/mL) Final    Blood Alcohol level:  Lab Results  Component Value Date   [facility] <15 10/24/2023   ETH <10 05/17/2023    Metabolic Disorder Labs: Lab Results  Component Value Date   HGBA1C 5.2 10/24/2023   MPG 102.54 10/24/2023   MPG 102.54 05/17/2023   No results found for: PROLACTIN Lab Results  Component  Value Date   CHOL 110 10/24/2023   TRIG 39 10/24/2023   HDL 48 10/24/2023   CHOLHDL 2.3 10/24/2023   VLDL 8 10/24/2023   LDLCALC 54 10/24/2023   LDLCALC 36 05/17/2023    Therapeutic Lab Levels: No results found for: LITHIUM No results found for: VALPROATE No results found for: CBMZ  Physical Findings   AIMS    Flowsheet Row Admission (Discharged) from 02/22/2018 in [facility]PATIENT BEHAVIORAL MEDICINE  AIMS Total Score 0   AUDIT    Flowsheet Row Admission (Discharged) from 02/22/2018 in [facility] INPATIENT BE[facility]AV[facility]AL MEDICINE  Alcohol Use Disorder Identification Test Final Score (AUDIT) 8   PHQ2-9    Flowsheet Row ED from 10/24/2023 in [facility] Most recent reading at 10/27/2023  4:00 PM ED from 10/24/2023 in [facility] Most recent reading at 10/24/2023  1:26 PM ED from 05/18/2023 in [facility] Most recent reading at 05/23/2023  9:47 AM ED from 03/29/2023 in [facility] Most recent reading at  04/02/2023 12:38 PM  PHQ-2 Total Score 2 2 3 1   PHQ-9 Total Score 4 7 8 4    Flowsheet Row ED from 10/24/2023 in [facility] Most recent reading at 10/24/2023  1:38 PM ED from 10/24/2023 in [facility] Most recent reading at 10/24/2023  1:34 PM ED from 05/18/2023 in [facility] Emergency Department at [facility] Most recent reading at 05/18/2023  8:07 AM  C-SSRS RISK CATEGORY No Risk No Risk No Risk     Musculoskeletal  Strength & Muscle Tone: within normal limits Gait & Station: normal Patient leans: N/A  Psychiatric Specialty Exam  Presentation  General Appearance:  Disheveled  Eye Contact: Fair  Speech: Clear and Coherent  Speech Volume: Normal  Handedness: Right   Mood and Affect  Mood: Euthymic  Affect: Congruent   Thought Process  Thought Processes: Coherent  Descriptions of  Associations:Intact  Orientation:Full (Time, Place and Person)  Thought Content:Logical  Diagnosis of Schizophrenia or Schizoaffective disorder in past: No    Hallucinations:No data recorded  Ideas of Reference:None  Suicidal Thoughts:No data recorded  Homicidal Thoughts:No data recorded   Sensorium  Memory: Immediate Fair; Recent Fair; Remote Fair  Judgment: Fair  Insight: Fair   Chartered certified accountant: Fair  Attention Span: Fair  Recall: [employer] of Knowledge: Fair  Language: Fair   Psychomotor Activity  Psychomotor Activity: No data recorded   Assets  Assets: Communication Skills; Desire for Improvement; Social Support   Sleep  Sleep: No data recorded  Estimated Sleeping Duration (Last 24 Hours): 7.75-8.50 hours  No data recorded  Physical Exam  Physical Exam Vitals and nursing note reviewed.  Constitutional:      Appearance: Normal appearance.  HENT:     Head: Normocephalic.     Nose: Nose normal.  Eyes:     Extraocular Movements: Extraocular movements intact.  Cardiovascular:     Rate and Rhythm: Normal rate.  Pulmonary:     Effort: Pulmonary effort is normal.  Musculoskeletal:        General: Normal range of motion.     Cervical back: Normal range of motion.  Neurological:     General: No focal deficit present.     Mental Status: She is alert and oriented to person, place, and time.    Review of Systems  Constitutional: Negative.   HENT: Negative.    Eyes: Negative.   Respiratory: Negative.    Cardiovascular: Negative.   Gastrointestinal: Negative.   Genitourinary: Negative.   Musculoskeletal: Negative.   Neurological: Negative.   Endo/Heme/Allergies: Negative.   Psychiatric/Behavioral:  Positive for substance abuse. The patient is nervous/anxious.    Blood pressure 109/60, pulse 91, temperature 97.8 F (36.6 C), resp. rate 17, SpO2 100%. There is no height or weight on file to calculate  BMI.  Treatment Plan Summary: Daily contact with patient to assess and evaluate symptoms and progress in treatment and Medication management - Patient will continue treatment in [facility] for polysubstance use -Plan to discharge on 10/29/23 with peer support specialist who will take her to residential treatment facility once approved. -Continue Suboxone  8-2 mg twice daily today for OUD -Decrease trazodone  from 100mg  to 50mg   mg as needed for sleep - Restart Remeron  7.5mg  for mood and sleep, as Bupropion is contraindicated with hx of seizure (chart noted possible seizure in July 2025). -Continue hydroxyzine  25 mg 3 times daily for anxiety -Continue agitation protocol ordered per policy   Alan C  Thresa, NP 10/28/2023 8:50 AM

## 2023-10-29 DIAGNOSIS — F1124 Opioid dependence with opioid-induced mood disorder: Secondary | ICD-10-CM | POA: Diagnosis not present

## 2023-10-29 DIAGNOSIS — F319 Bipolar disorder, unspecified: Secondary | ICD-10-CM | POA: Diagnosis not present

## 2023-10-29 DIAGNOSIS — F4310 Post-traumatic stress disorder, unspecified: Secondary | ICD-10-CM | POA: Diagnosis not present

## 2023-10-29 DIAGNOSIS — F17210 Nicotine dependence, cigarettes, uncomplicated: Secondary | ICD-10-CM | POA: Diagnosis not present

## 2023-10-29 MED ORDER — BUPRENORPHINE HCL-NALOXONE HCL 8-2 MG SL SUBL: 1.0000 | SUBLINGUAL_TABLET | Freq: Every day | SUBLINGUAL | 0 refills | Status: DC

## 2023-10-29 MED ORDER — NICOTINE 21 MG/24HR TD PT24: 21.0000 mg | MEDICATED_PATCH | Freq: Every day | TRANSDERMAL | 0 refills | Status: DC

## 2023-10-29 MED ORDER — MIRTAZAPINE 7.5 MG PO TABS: 7.5000 mg | ORAL_TABLET | Freq: Every day | ORAL | 0 refills | Status: DC

## 2023-10-29 MED ORDER — HYDROXYZINE HCL 25 MG PO TABS: 25.0000 mg | ORAL_TABLET | Freq: Three times a day (TID) | ORAL | 0 refills | Status: DC | PRN

## 2023-10-29 NOTE — ED Notes (Signed)
 Patient A&O x 4, ambulatory. Patient discharged in no acute distress. Patient denied SI/HI, A/VH upon discharge. Patient verbalized understanding of all discharge instructions reviewed on AVS via staff, to include follow up recommendations, RX's and safety. Pt belongings returned to patient from locker intact. Patient escorted to lobby via staff for transport to destination. Safety maintained.
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
## 2023-10-29 NOTE — ED Notes (Signed)
 Patient A&Ox4. Denies intent to harm self/others when asked. Denies A/VH. Patient denies any physical complaints when asked. No acute distress noted. Support and encouragement provided. Routine safety checks conducted according to facility protocol. Encouraged patient to notify staff if thoughts of harm toward self or others arise. Patient verbalize understanding and agreement. Will continue to monitor for safety.

## 2023-10-29 NOTE — ED Notes (Signed)
Patient asleep, NAD 

## 2023-11-27 ENCOUNTER — Other Ambulatory Visit (HOSPITAL_COMMUNITY)
Admission: EM | Admit: 2023-11-27 | Discharge: 2023-11-28 | Disposition: A | Discharge status: Other Acute Inpatient Hospital | Source: Intra-hospital

## 2023-11-27 ENCOUNTER — Other Ambulatory Visit (HOSPITAL_COMMUNITY)
Admission: EM | Admit: 2023-11-27 | Discharge: 2023-11-27 | Disposition: A | Discharge status: Other Acute Inpatient Hospital

## 2023-11-27 DIAGNOSIS — F1410 Cocaine abuse, uncomplicated: Secondary | ICD-10-CM | POA: Diagnosis not present

## 2023-11-27 DIAGNOSIS — F1994 Other psychoactive substance use, unspecified with psychoactive substance-induced mood disorder: Secondary | ICD-10-CM | POA: Insufficient documentation

## 2023-11-27 DIAGNOSIS — Z5900 Homelessness unspecified: Secondary | ICD-10-CM | POA: Insufficient documentation

## 2023-11-27 DIAGNOSIS — F1120 Opioid dependence, uncomplicated: Secondary | ICD-10-CM | POA: Diagnosis not present

## 2023-11-27 DIAGNOSIS — G4700 Insomnia, unspecified: Secondary | ICD-10-CM | POA: Diagnosis not present

## 2023-11-27 DIAGNOSIS — F1414 Cocaine abuse with cocaine-induced mood disorder: Secondary | ICD-10-CM | POA: Insufficient documentation

## 2023-11-27 DIAGNOSIS — F1124 Opioid dependence with opioid-induced mood disorder: Secondary | ICD-10-CM | POA: Insufficient documentation

## 2023-11-27 DIAGNOSIS — Z79899 Other long term (current) drug therapy: Secondary | ICD-10-CM | POA: Insufficient documentation

## 2023-11-27 DIAGNOSIS — F411 Generalized anxiety disorder: Secondary | ICD-10-CM | POA: Insufficient documentation

## 2023-11-27 DIAGNOSIS — F332 Major depressive disorder, recurrent severe without psychotic features: Secondary | ICD-10-CM | POA: Diagnosis not present

## 2023-11-27 DIAGNOSIS — F329 Major depressive disorder, single episode, unspecified: Secondary | ICD-10-CM | POA: Diagnosis not present

## 2023-11-27 DIAGNOSIS — T402X5A Adverse effect of other opioids, initial encounter: Secondary | ICD-10-CM | POA: Insufficient documentation

## 2023-11-27 LAB — MAGNESIUM: Magnesium: 2.1 mg/dL (ref 1.7–2.4)

## 2023-11-27 LAB — CBC WITH DIFFERENTIAL/PLATELET
Abs Immature Granulocytes: 0.05 K/uL (ref 0.00–0.07)
Basophils Absolute: 0 K/uL (ref 0.0–0.1)
Basophils Relative: 0 %
Eosinophils Absolute: 0.1 K/uL (ref 0.0–0.5)
Eosinophils Relative: 1 %
HCT: 43 % (ref 36.0–46.0)
Hemoglobin: 14.3 g/dL (ref 12.0–15.0)
Immature Granulocytes: 1 %
Lymphocytes Relative: 25 %
Lymphs Abs: 2.2 K/uL (ref 0.7–4.0)
MCH: 29 pg (ref 26.0–34.0)
MCHC: 33.3 g/dL (ref 30.0–36.0)
MCV: 87.2 fL (ref 80.0–100.0)
Monocytes Absolute: 0.3 K/uL (ref 0.1–1.0)
Monocytes Relative: 4 %
Neutro Abs: 5.9 K/uL (ref 1.7–7.7)
Neutrophils Relative %: 69 %
Platelets: 440 K/uL — ABNORMAL HIGH (ref 150–400)
RBC: 4.93 MIL/uL (ref 3.87–5.11)
RDW: 12.3 % (ref 11.5–15.5)
WBC: 8.5 K/uL (ref 4.0–10.5)
nRBC: 0 % (ref 0.0–0.2)

## 2023-11-27 LAB — LIPID PANEL
Cholesterol: 165 mg/dL (ref 0–200)
HDL: 65 mg/dL (ref >40)
LDL Cholesterol: 78 mg/dL (ref 0–99)
Total CHOL/HDL Ratio: 2.5 ratio
Triglycerides: 110 mg/dL (ref <150)
VLDL: 22 mg/dL (ref 0–40)

## 2023-11-27 LAB — URINALYSIS, ROUTINE W REFLEX MICROSCOPIC
Bilirubin Urine: NEGATIVE
Glucose, UA: NEGATIVE mg/dL
Hgb urine dipstick: NEGATIVE
Ketones, ur: NEGATIVE mg/dL
Leukocytes,Ua: NEGATIVE
Nitrite: NEGATIVE
Protein, ur: NEGATIVE mg/dL
Specific Gravity, Urine: 1.023 (ref 1.005–1.030)
pH: 6 (ref 5.0–8.0)

## 2023-11-27 LAB — COMPREHENSIVE METABOLIC PANEL WITH GFR
ALT: 19 U/L (ref 0–44)
AST: 21 U/L (ref 15–41)
Albumin: 3.8 g/dL (ref 3.5–5.0)
Alkaline Phosphatase: 62 U/L (ref 38–126)
Anion gap: 11 (ref 5–15)
BUN: 9 mg/dL (ref 6–20)
CO2: 25 mmol/L (ref 22–32)
Calcium: 9.6 mg/dL (ref 8.9–10.3)
Chloride: 102 mmol/L (ref 98–111)
Creatinine, Ser: 0.6 mg/dL (ref 0.44–1.00)
GFR, Estimated: >60 mL/min (ref >60)
Glucose, Bld: 96 mg/dL (ref 70–99)
Potassium: 4.2 mmol/L (ref 3.5–5.1)
Sodium: 138 mmol/L (ref 135–145)
Total Bilirubin: 0.5 mg/dL (ref 0.0–1.2)
Total Protein: 8 g/dL (ref 6.5–8.1)

## 2023-11-27 LAB — POCT URINE DRUG SCREEN - MANUAL ENTRY (I-SCREEN)
POC Amphetamine UR: NOT DETECTED
POC Buprenorphine (BUP): NOT DETECTED
POC Cocaine UR: POSITIVE — AB
POC Marijuana UR: NOT DETECTED
POC Methadone UR: NOT DETECTED
POC Methamphetamine UR: NOT DETECTED
POC Morphine: NOT DETECTED
POC Oxazepam (BZO): NOT DETECTED
POC Oxycodone UR: NOT DETECTED
POC Secobarbital (BAR): NOT DETECTED

## 2023-11-27 LAB — ETHANOL: Alcohol, Ethyl (B): <15 mg/dL (ref <15)

## 2023-11-27 LAB — HIV ANTIBODY (ROUTINE TESTING W REFLEX): HIV Screen 4th Generation wRfx: NONREACTIVE

## 2023-11-27 LAB — TSH: TSH: 0.51 u[IU]/mL (ref 0.350–4.500)

## 2023-11-27 LAB — HEMOGLOBIN A1C
Hgb A1c MFr Bld: 5.3 % (ref 4.8–5.6)
Mean Plasma Glucose: 105.41 mg/dL

## 2023-11-27 LAB — POC URINE PREG, ED: Preg Test, Ur: NEGATIVE

## 2023-11-27 MED ORDER — LORAZEPAM 2 MG/ML IJ SOLN: 2.0000 mg | Freq: Three times a day (TID) | INTRAMUSCULAR | Status: DC | PRN

## 2023-11-27 MED ORDER — LOPERAMIDE HCL 2 MG PO CAPS: 2.0000 mg | ORAL_CAPSULE | ORAL | Status: DC | PRN

## 2023-11-27 MED ORDER — HALOPERIDOL LACTATE 5 MG/ML IJ SOLN: 5.0000 mg | Freq: Three times a day (TID) | INTRAMUSCULAR | Status: DC | PRN

## 2023-11-27 MED ORDER — ONDANSETRON 4 MG PO TBDP: 4.0000 mg | ORAL_TABLET | Freq: Four times a day (QID) | ORAL | Status: DC | PRN

## 2023-11-27 MED ORDER — HALOPERIDOL 5 MG PO TABS: 5.0000 mg | ORAL_TABLET | Freq: Three times a day (TID) | ORAL | Status: DC | PRN

## 2023-11-27 MED ORDER — CLONIDINE HCL 0.1 MG PO TABS: 0.1000 mg | ORAL_TABLET | Freq: Every day | ORAL | Status: DC

## 2023-11-27 MED ORDER — DIPHENHYDRAMINE HCL 50 MG PO CAPS: 50.0000 mg | ORAL_CAPSULE | Freq: Three times a day (TID) | ORAL | Status: DC | PRN

## 2023-11-27 MED ORDER — HYDROXYZINE HCL 25 MG PO TABS
25.0000 mg | ORAL_TABLET | Freq: Four times a day (QID) | ORAL | Status: DC | PRN
  Administered 2023-11-27 – 2023-11-28 (×4): 25 mg via ORAL
  Filled 2023-11-27: qty 1

## 2023-11-27 MED ORDER — DIPHENHYDRAMINE HCL 50 MG/ML IJ SOLN: 50.0000 mg | Freq: Three times a day (TID) | INTRAMUSCULAR | Status: DC | PRN

## 2023-11-27 MED ORDER — ALUM & MAG HYDROXIDE-SIMETH 200-200-20 MG/5ML PO SUSP: 30.0000 mL | ORAL | Status: DC | PRN

## 2023-11-27 MED ORDER — HYDROXYZINE HCL 25 MG PO TABS: 25.0000 mg | ORAL_TABLET | Freq: Three times a day (TID) | ORAL | Status: DC | PRN

## 2023-11-27 MED ORDER — HALOPERIDOL LACTATE 5 MG/ML IJ SOLN: 10.0000 mg | Freq: Three times a day (TID) | INTRAMUSCULAR | Status: DC | PRN

## 2023-11-27 MED ORDER — ACETAMINOPHEN 325 MG PO TABS: 650.0000 mg | ORAL_TABLET | Freq: Four times a day (QID) | ORAL | Status: DC | PRN

## 2023-11-27 MED ORDER — MAGNESIUM HYDROXIDE 400 MG/5ML PO SUSP: 30.0000 mL | Freq: Every day | ORAL | Status: DC | PRN

## 2023-11-27 MED ORDER — HYDROXYZINE HCL 25 MG PO TABS
25.0000 mg | ORAL_TABLET | Freq: Three times a day (TID) | ORAL | Status: DC | PRN
  Filled 2023-11-27 (×3): qty 1

## 2023-11-27 MED ORDER — HYDROXYZINE HCL 25 MG PO TABS: 25.0000 mg | ORAL_TABLET | Freq: Four times a day (QID) | ORAL | Status: DC | PRN

## 2023-11-27 MED ORDER — MIRTAZAPINE 7.5 MG PO TABS: 7.5000 mg | ORAL_TABLET | Freq: Every day | ORAL | Status: DC

## 2023-11-27 MED ORDER — DICYCLOMINE HCL 20 MG PO TABS
20.0000 mg | ORAL_TABLET | Freq: Four times a day (QID) | ORAL | Status: DC | PRN
  Administered 2023-11-27 – 2023-11-28 (×3): 20 mg via ORAL
  Filled 2023-11-27 (×3): qty 1

## 2023-11-27 MED ORDER — TRAZODONE HCL 50 MG PO TABS: 50.0000 mg | ORAL_TABLET | Freq: Every evening | ORAL | Status: DC | PRN

## 2023-11-27 MED ORDER — CLONIDINE HCL 0.1 MG PO TABS: 0.1000 mg | ORAL_TABLET | Freq: Four times a day (QID) | ORAL | Status: DC

## 2023-11-27 MED ORDER — DICYCLOMINE HCL 20 MG PO TABS: 20.0000 mg | ORAL_TABLET | Freq: Four times a day (QID) | ORAL | Status: DC | PRN

## 2023-11-27 MED ORDER — METHOCARBAMOL 500 MG PO TABS: 500.0000 mg | ORAL_TABLET | Freq: Three times a day (TID) | ORAL | Status: DC | PRN

## 2023-11-27 MED ORDER — NAPROXEN 500 MG PO TABS
500.0000 mg | ORAL_TABLET | Freq: Two times a day (BID) | ORAL | Status: DC | PRN
  Administered 2023-11-27 – 2023-11-28 (×2): 500 mg via ORAL
  Filled 2023-11-27 (×2): qty 1

## 2023-11-27 MED ORDER — CLONIDINE HCL 0.1 MG PO TABS: 0.1000 mg | ORAL_TABLET | ORAL | Status: DC

## 2023-11-27 MED ORDER — METHOCARBAMOL 500 MG PO TABS
500.0000 mg | ORAL_TABLET | Freq: Three times a day (TID) | ORAL | Status: DC | PRN
  Administered 2023-11-28: 500 mg via ORAL
  Filled 2023-11-27: qty 1

## 2023-11-27 MED ORDER — TRAZODONE HCL 50 MG PO TABS
50.0000 mg | ORAL_TABLET | Freq: Every evening | ORAL | Status: DC | PRN
  Administered 2023-11-27: 50 mg via ORAL
  Filled 2023-11-27: qty 1

## 2023-11-27 MED ORDER — ACETAMINOPHEN 325 MG PO TABS
650.0000 mg | ORAL_TABLET | Freq: Four times a day (QID) | ORAL | Status: DC | PRN
  Administered 2023-11-27 – 2023-11-28 (×2): 650 mg via ORAL
  Filled 2023-11-27 (×2): qty 2

## 2023-11-27 MED ORDER — NICOTINE 21 MG/24HR TD PT24: 21.0000 mg | MEDICATED_PATCH | Freq: Every day | TRANSDERMAL | Status: DC

## 2023-11-27 MED ORDER — NAPROXEN 500 MG PO TABS: 500.0000 mg | ORAL_TABLET | Freq: Two times a day (BID) | ORAL | Status: DC | PRN

## 2023-11-27 MED ORDER — CLONIDINE HCL 0.1 MG PO TABS
0.1000 mg | ORAL_TABLET | Freq: Four times a day (QID) | ORAL | Status: DC
  Administered 2023-11-27 – 2023-11-28 (×4): 0.1 mg via ORAL
  Filled 2023-11-27 (×5): qty 1
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
## 2023-11-27 NOTE — ED Notes (Signed)
Patient transferred to [facility] ?

## 2023-11-27 NOTE — ED Provider Notes (Addendum)
 Facility Based Crisis Admission H&P  Date: 11/28/23 Patient Name: Cassidy Underwood [facility] MRN: [id number] Chief Complaint: needing detox followed by inpatient rehab as per patient.  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 female with a history of polysubstance abuse including cocaine, opioids, and fentanyl  presenting to the [facility] today accompanied by her peer support specialist Bart Stacks), requesting physiological management of symptoms related to withdrawals from opioids and cocaine followed by referrals to long-term rehabilitation programs.   Assessment: Patient is seen along with behavioral health counselor, her peer support specialist is present in the room, with her verbal consent.  As per chart review, patient has presented to the ER and to this behavioral health urgent care multiple times over the past 6 months, but has persistently not followed through with outpatient recommendations.  During patient's last stay here at the [facility] behavioral health facility based crisis treatment center, patient was supposed to go to [facility] which was arranged by CSW, for rehabilitation, but she did not follow through after she discharged from here.  Writer made considerations not to readmit to the facility based crisis treatment area of the [facility], but patient's peer specialist patient present in the room who assisted in advocating for patient, and patient reiterated that she had changed her thought process, and is currently in the ready phase of her recovery.  Patient reports that she began using fentanyl  and cocaine or heroin and cocaine, consisting of $20, and $20 of each type of substance in the same crack pipe each time that she used, on a daily basis.  She states that the last time she used was last  night, but used $10, and $10 worth of each substance respectively.  Patient shares that she drank some vodka this morning, to ease herself of her withdrawal symptoms, but is not typically a drinker.  She denies persistent and recurrent use of alcohol.  Denies any other substance use not mentioned above.  Reports withdrawal symptoms inclusive of cold sweats, tremors, GI symptoms, tachycardia, anxiety.  She is noted to be restless, rocking back and forth, seems anxious.  Pt presents with a depressed mood, attention to personal hygiene and grooming is poor, she is dressed with her whole abdomen exposed, educated that this type of dressing is not allowed on unit, peer specialist states that she brought her clothing. Her eye contact is good, speech is clear & coherent. Thought contents are organized and logical, and pt currently denies SI/HI/AVH or paranoia. There is no evidence of delusional thoughts.  There are no overt signs of psychosis.  Reports symptoms consistent for MDD including insomnia, anhedonia, decreased energy levels, poor concentration levels, poor appetite, mental clouding, as well as symptoms significant for GAD including restlessness, overly worrying, muscle tension.  Social history as provided to this Clinical research associate on 05/17/2023: She reports that she used to take psychotropic medications as a teenager, but has not done so since then. She shares that the only medication that she remembers taking then was Depakote, unable to report otherwise. She reports that she does not have any mental health services on the outpatient. She is tearful, reports that she grew up in the system, specifically foster care, states that she was given up for adoption, but was not able to be adopted, aged out, and has been homeless ever since age and out of the  system. She reports that she used to work as a Horticulturist, commercial and a Product/process development scientist, but lost her housing, and has also been homeless in that time frame. She shares that she has a  friend who is wanting to help her secure housing currently. Denies that she was to go to long-term rehab.   Please see Counselor's note for a comprehensive social history.  Recommendations: Will admit to the facility based crisis treatment center, will start detox protocol for opioid withdrawals, after completion, patient will work with CSW to locate a rehabilitation treatment facility.  Patient's peer specialist: Zollie Kenner [phone] would like to be notified when patient is discharged, or arrangements made for rehabilitation so that she can personally ensure that patient is followed through with going to rehab.   PHQ 2-9:  Flowsheet Row ED from 11/27/2023 in [facility] Most recent reading at 11/28/2023  1:09 PM ED from 11/27/2023 in [facility] Most recent reading at 11/27/2023 12:40 PM ED from 10/24/2023 in [facility] Most recent reading at 10/27/2023  4:00 PM  Thoughts that you would be better off dead, or of hurting yourself in some way Several days Several days Not at all  PHQ-9 Total Score 9 9 4     Flowsheet Row ED from 11/27/2023 in [facility] Most recent reading at 11/27/2023  1:25 PM ED from 11/27/2023 in [facility] Most recent reading at 11/27/2023 10:50 AM ED from 10/24/2023 in [facility] Most recent reading at 10/24/2023  1:38 PM  C-SSRS RISK CATEGORY No Risk No Risk No Risk     Total Time spent with patient: 45 minutes  Musculoskeletal  Strength & Muscle Tone: within normal limits Gait & Station: normal Patient leans: N/A  Psychiatric Specialty Exam  Presentation General Appearance:  Casual  Eye Contact: Fair  Speech: Clear and Coherent  Speech Volume: Normal  Handedness: Right   Mood and Affect  Mood: Anxious  Affect: Depressed   Thought Process  Thought  Processes: Coherent  Descriptions of Associations:Intact  Orientation:Full (Time, Place and Person)  Thought Content:Logical; WDL  Diagnosis of Schizophrenia or Schizoaffective disorder in past: No   Hallucinations:Hallucinations: None  Ideas of Reference:None  Suicidal Thoughts:Suicidal Thoughts: No  Homicidal Thoughts:Homicidal Thoughts: No   Sensorium  Memory: Immediate Fair  Judgment: Fair  Insight: Fair   Art therapist  Concentration: Fair  Attention Span: Fair  Recall: [employer] of Knowledge: Fair  Language: Fair   Psychomotor Activity  Psychomotor Activity:Psychomotor Activity: Normal   Assets  Assets: Communication Skills; Social Support   Sleep  Sleep:Sleep: Poor   Nutritional Assessment (For OBS and FBC admissions only) Has the patient had a weight loss or gain of 10 pounds or more in the last 3 months?: Yes Has the patient had a decrease in food intake/or appetite?: Yes Does the patient have dental problems?: No Does the patient have eating habits or behaviors that may be indicators of an eating disorder including binging or inducing vomiting?: No Has the patient recently lost weight without trying?: 0 Has the patient been eating poorly because of a decreased appetite?: 0 Malnutrition Screening Tool Score: 0    Physical Exam Vitals and nursing note reviewed.  Constitutional:      Appearance: Normal appearance.  Musculoskeletal:     Cervical back: Normal range of motion.  Skin:    General: Skin is warm.  Neurological:  General: No focal deficit present.     Mental Status: She is alert and oriented to person, place, and time.    Review of Systems  Psychiatric/Behavioral:  Positive for depression and substance abuse. Negative for hallucinations, memory loss and suicidal ideas. The patient is nervous/anxious and has insomnia.   All other systems reviewed and are negative.   Blood pressure 111/66, pulse 99,  temperature 98 F (36.7 C), temperature source Oral, resp. rate 16, SpO2 100%. There is no height or weight on file to calculate BMI.  Is the patient at risk to self? Yes due to dangerous behaviors involving substance abuse. Has the patient been a risk to self in the past 6 months? Yes .    Has the patient been a risk to self within the distant past? Yes   Is the patient a risk to others? No   Has the patient been a risk to others in the past 6 months? No   Has the patient been a risk to others within the distant past? No   Past Medical History: denies Family History: denies knowing-states was adopted Social History: see above  Last Labs:  Admission on 11/27/2023, Discharged on 11/27/2023  Component Date Value Ref Range Status   WBC 11/27/2023 8.5  4.0 - 10.5 K/uL Final   RBC 11/27/2023 4.93  3.87 - 5.11 MIL/uL Final   Hemoglobin 11/27/2023 14.3  12.0 - 15.0 g/dL Final   HCT 89/85/7974 43.0  36.0 - 46.0 % Final   MCV 11/27/2023 87.2  80.0 - 100.0 fL Final   MCH 11/27/2023 29.0  26.0 - 34.0 pg Final   MCHC 11/27/2023 33.3  30.0 - 36.0 g/dL Final   RDW 89/85/7974 12.3  11.5 - 15.5 % Final   Platelets 11/27/2023 440 (H)  150 - 400 K/uL Final   nRBC 11/27/2023 0.0  0.0 - 0.2 % Final   Neutrophils Relative % 11/27/2023 69  % Final   Neutro Abs 11/27/2023 5.9  1.7 - 7.7 K/uL Final   Lymphocytes Relative 11/27/2023 25  % Final   Lymphs Abs 11/27/2023 2.2  0.7 - 4.0 K/uL Final   Monocytes Relative 11/27/2023 4  % Final   Monocytes Absolute 11/27/2023 0.3  0.1 - 1.0 K/uL Final   Eosinophils Relative 11/27/2023 1  % Final   Eosinophils Absolute 11/27/2023 0.1  0.0 - 0.5 K/uL Final   Basophils Relative 11/27/2023 0  % Final   Basophils Absolute 11/27/2023 0.0  0.0 - 0.1 K/uL Final   Immature Granulocytes 11/27/2023 1  % Final   Abs Immature Granulocytes 11/27/2023 0.05  0.00 - 0.07 K/uL Final   Performed at [facility] Lab, [address] [place], [place] [address]   Sodium  11/27/2023 138  135 - 145 mmol/L Final   Potassium 11/27/2023 4.2  3.5 - 5.1 mmol/L Final   Chloride 11/27/2023 102  98 - 111 mmol/L Final   CO2 11/27/2023 25  22 - 32 mmol/L Final   Glucose, Bld 11/27/2023 96  70 - 99 mg/dL Final   Glucose reference range applies only to samples taken after fasting for at least 8 hours.   BUN 11/27/2023 9  6 - 20 mg/dL Final   Creatinine, Ser 11/27/2023 0.60  0.44 - 1.00 mg/dL Final   Calcium  11/27/2023 9.6  8.9 - 10.3 mg/dL Final   Total Protein 89/85/7974 8.0  6.5 - 8.1 g/dL Final   Albumin 89/85/7974 3.8  3.5 - 5.0 g/dL Final  AST 11/27/2023 21  15 - 41 U/L Final   ALT 11/27/2023 19  0 - 44 U/L Final   Alkaline Phosphatase 11/27/2023 62  38 - 126 U/L Final   Total Bilirubin 11/27/2023 0.5  0.0 - 1.2 mg/dL Final   GFR, Estimated 11/27/2023 >60  >60 mL/min Final   Comment: (NOTE) Calculated using the CKD-EPI Creatinine Equation (2021)    Anion gap 11/27/2023 11  5 - 15 Final   Performed at [facility] Lab, [address] [place], [place] [address]   Hgb A1c MFr Bld 11/27/2023 5.3  4.8 - 5.6 % Final   Comment: (NOTE) Diagnosis of Diabetes The following HbA1c ranges recommended by the American Diabetes Association (ADA) may be used as an aid in the diagnosis of diabetes mellitus.  Hemoglobin             Suggested A1C NGSP%              Diagnosis  <5.7                   Non Diabetic  5.7-6.4                Pre-Diabetic  >6.4                   Diabetic  <7.0                   Glycemic control for                       adults with diabetes.     Mean Plasma Glucose 11/27/2023 105.41  mg/dL Final   Performed at [facility] Lab, [address], [place], [place] [address]   Magnesium  11/27/2023 2.1  1.7 - 2.4 mg/dL Final   Performed at [facility], [address], [place], [place] [address]   Alcohol, Ethyl (B) 11/27/2023 <15  <15 mg/dL Final   Comment: (NOTE) For medical purposes only. Performed at [facility] Lab,  [address], [place], [place] [address]    Cholesterol 11/27/2023 165  0 - 200 mg/dL Final   Triglycerides 89/85/7974 110  <150 mg/dL Final   HDL 89/85/7974 65  >40 mg/dL Final   Total CHOL/HDL Ratio 11/27/2023 2.5  RATIO Final   VLDL 11/27/2023 22  0 - 40 mg/dL Final   LDL Cholesterol 11/27/2023 78  0 - 99 mg/dL Final   Comment:        Total Cholesterol/HDL:CHD Risk Coronary Heart Disease Risk Table                     Men   Women  1/2 Average Risk   3.4   3.3  Average Risk       5.0   4.4  2 X Average Risk   9.6   7.1  3 X Average Risk  23.4   11.0        Use the calculated Patient Ratio above and the CHD Risk Table to determine the patient's CHD Risk.        ATP III CLASSIFICATION (LDL):  <100     mg/dL   Optimal  899-870  mg/dL   Near or Above                    Optimal  130-159  mg/dL   Borderline  839-810  mg/dL   High  >  190     mg/dL   Very High Performed at [facility], [address], [place], [place] [address]    TSH 11/27/2023 0.510  0.350 - 4.500 uIU/mL Final   Comment: Performed by a 3rd Generation assay with a functional sensitivity of <=0.01 uIU/mL. Performed at [facility], [address] [place], [place] [address]    Neisseria Gonorrhea 11/27/2023 Negative   Final   Chlamydia 11/27/2023 Negative   Final   Comment 11/27/2023 Normal Reference Ranger Chlamydia - Negative   Final   Comment 11/27/2023 Normal Reference Range Neisseria Gonorrhea - Negative   Final   Color, Urine 11/27/2023 YELLOW  YELLOW Final   APPearance 11/27/2023 HAZY (A)  CLEAR Final   Specific Gravity, Urine 11/27/2023 1.023  1.005 - 1.030 Final   pH 11/27/2023 6.0  5.0 - 8.0 Final   Glucose, UA 11/27/2023 NEGATIVE  NEGATIVE mg/dL Final   Hgb urine dipstick 11/27/2023 NEGATIVE  NEGATIVE Final   Bilirubin Urine 11/27/2023 NEGATIVE  NEGATIVE Final   Ketones, ur 11/27/2023 NEGATIVE  NEGATIVE mg/dL Final   Protein, ur 89/85/7974 NEGATIVE  NEGATIVE mg/dL Final   Nitrite  89/85/7974 NEGATIVE  NEGATIVE Final   Leukocytes,Ua 11/27/2023 NEGATIVE  NEGATIVE Final   Performed at [facility] Lab, [address], [place], [place] [address]   Preg Test, Ur 11/27/2023 Negative  Negative Final   POC Amphetamine UR 11/27/2023 None Detected  NONE DETECTED (Cut Off Level 1000 ng/mL) Final   POC Secobarbital (BAR) 11/27/2023 None Detected  NONE DETECTED (Cut Off Level 300 ng/mL) Final   POC Buprenorphine  (BUP) 11/27/2023 None Detected  NONE DETECTED (Cut Off Level 10 ng/mL) Final   POC Oxazepam (BZO) 11/27/2023 None Detected  NONE DETECTED (Cut Off Level 300 ng/mL) Final   POC Cocaine UR 11/27/2023 Positive (A)  NONE DETECTED (Cut Off Level 300 ng/mL) Final   POC Methamphetamine UR 11/27/2023 None Detected  NONE DETECTED (Cut Off Level 1000 ng/mL) Final   POC Morphine  11/27/2023 None Detected  NONE DETECTED (Cut Off Level 300 ng/mL) Final   POC Methadone UR 11/27/2023 None Detected  NONE DETECTED (Cut Off Level 300 ng/mL) Final   POC Oxycodone  UR 11/27/2023 None Detected  NONE DETECTED (Cut Off Level 100 ng/mL) Final   POC Marijuana UR 11/27/2023 None Detected  NONE DETECTED (Cut Off Level 50 ng/mL) Final   RPR Ser Ql 11/27/2023 NON REACTIVE  NON REACTIVE Final   Performed at [facility], [address], [place], [place] [address]   HIV Screen 4th Generation wRfx 11/27/2023 Non Reactive  Non Reactive Final   Performed at [facility], [address], [place], [place] [address]  Admission on 10/24/2023, Discharged on 10/24/2023  Component Date Value Ref Range Status   WBC 10/24/2023 8.9  4.0 - 10.5 K/uL Final   RBC 10/24/2023 4.27  3.87 - 5.11 MIL/uL Final   Hemoglobin 10/24/2023 12.4  12.0 - 15.0 g/dL Final   HCT 90/89/7974 37.3  36.0 - 46.0 % Final   MCV 10/24/2023 87.4  80.0 - 100.0 fL Final   MCH 10/24/2023 29.0  26.0 - 34.0 pg Final   MCHC 10/24/2023 33.2  30.0 - 36.0 g/dL Final   RDW 90/89/7974 11.9  11.5 - 15.5 % Final   Platelets 10/24/2023 330   150 - 400 K/uL Final   nRBC 10/24/2023 0.0  0.0 - 0.2 % Final   Neutrophils Relative % 10/24/2023 50  % Final  Neutro Abs 10/24/2023 4.4  1.7 - 7.7 K/uL Final   Lymphocytes Relative 10/24/2023 42  % Final   Lymphs Abs 10/24/2023 3.7  0.7 - 4.0 K/uL Final   Monocytes Relative 10/24/2023 5  % Final   Monocytes Absolute 10/24/2023 0.5  0.1 - 1.0 K/uL Final   Eosinophils Relative 10/24/2023 3  % Final   Eosinophils Absolute 10/24/2023 0.3  0.0 - 0.5 K/uL Final   Basophils Relative 10/24/2023 0  % Final   Basophils Absolute 10/24/2023 0.0  0.0 - 0.1 K/uL Final   Immature Granulocytes 10/24/2023 0  % Final   Abs Immature Granulocytes 10/24/2023 0.03  0.00 - 0.07 K/uL Final   Performed at [facility], [address], [place], [place] [address]   Sodium 10/24/2023 139  135 - 145 mmol/L Final   Potassium 10/24/2023 3.7  3.5 - 5.1 mmol/L Final   Chloride 10/24/2023 102  98 - 111 mmol/L Final   CO2 10/24/2023 29  22 - 32 mmol/L Final   Glucose, Bld 10/24/2023 72  70 - 99 mg/dL Final   Glucose reference range applies only to samples taken after fasting for at least 8 hours.   BUN 10/24/2023 8  6 - 20 mg/dL Final   Creatinine, Ser 10/24/2023 0.66  0.44 - 1.00 mg/dL Final   Calcium  10/24/2023 8.9  8.9 - 10.3 mg/dL Final   Total Protein 90/89/7974 5.7 (L)  6.5 - 8.1 g/dL Final   Albumin 90/89/7974 3.3 (L)  3.5 - 5.0 g/dL Final   AST 90/89/7974 19  15 - 41 U/L Final   ALT 10/24/2023 14  0 - 44 U/L Final   Alkaline Phosphatase 10/24/2023 45  38 - 126 U/L Final   Total Bilirubin 10/24/2023 0.5  0.0 - 1.2 mg/dL Final   GFR, Estimated 10/24/2023 >60  >60 mL/min Final   Comment: (NOTE) Calculated using the CKD-EPI Creatinine Equation (2021)    Anion gap 10/24/2023 8  5 - 15 Final   Performed at [facility] Lab, [address] [place], [place] [address]   Hgb A1c MFr Bld 10/24/2023 5.2  4.8 - 5.6 % Final   Comment: (NOTE) Diagnosis of Diabetes The following HbA1c ranges recommended by  the American Diabetes Association (ADA) may be used as an aid in the diagnosis of diabetes mellitus.  Hemoglobin             Suggested A1C NGSP%              Diagnosis  <5.7                   Non Diabetic  5.7-6.4                Pre-Diabetic  >6.4                   Diabetic  <7.0                   Glycemic control for                       adults with diabetes.     Mean Plasma Glucose 10/24/2023 102.54  mg/dL Final   Performed at [facility], [address], [place], [place] [address]   Magnesium  10/24/2023 1.9  1.7 - 2.4 mg/dL Final   Performed at [facility], [address], [place], [place] [address]   Alcohol, Ethyl (B)  10/24/2023 <15  <15 mg/dL Final   Comment: (NOTE) For medical purposes only. Performed at [facility], [address], [place], [place] [address]    Cholesterol 10/24/2023 110  0 - 200 mg/dL Final   Triglycerides 90/89/7974 39  <150 mg/dL Final   HDL 90/89/7974 48  >40 mg/dL Final   Total CHOL/HDL Ratio 10/24/2023 2.3  RATIO Final   VLDL 10/24/2023 8  0 - 40 mg/dL Final   LDL Cholesterol 10/24/2023 54  0 - 99 mg/dL Final   Comment:        Total Cholesterol/HDL:CHD Risk Coronary Heart Disease Risk Table                     Men   Women  1/2 Average Risk   3.4   3.3  Average Risk       5.0   4.4  2 X Average Risk   9.6   7.1  3 X Average Risk  23.4   11.0        Use the calculated Patient Ratio above and the CHD Risk Table to determine the patient's CHD Risk.        ATP III CLASSIFICATION (LDL):  <100     mg/dL   Optimal  899-870  mg/dL   Near or Above                    Optimal  130-159  mg/dL   Borderline  839-810  mg/dL   High  >809     mg/dL   Very High Performed at [facility] Lab, [address], [place], [place] [address]    TSH 10/24/2023 1.765  0.350 - 4.500 uIU/mL Final   Comment: Performed by a 3rd Generation assay with a functional sensitivity of <=0.01 uIU/mL. Performed at [facility] Lab, [address], [place], [place] [address]    Color, Urine 10/24/2023 YELLOW  YELLOW Final   APPearance 10/24/2023 HAZY (A)  CLEAR Final   Specific Gravity, Urine 10/24/2023 1.024  1.005 - 1.030 Final   pH 10/24/2023 5.0  5.0 - 8.0 Final   Glucose, UA 10/24/2023 NEGATIVE  NEGATIVE mg/dL Final   Hgb urine dipstick 10/24/2023 SMALL (A)  NEGATIVE Final   Bilirubin Urine 10/24/2023 NEGATIVE  NEGATIVE Final   Ketones, ur 10/24/2023 NEGATIVE  NEGATIVE mg/dL Final   Protein, ur 90/89/7974 NEGATIVE  NEGATIVE mg/dL Final   Nitrite 90/89/7974 NEGATIVE  NEGATIVE Final   Leukocytes,Ua 10/24/2023 NEGATIVE  NEGATIVE Final   RBC / HPF 10/24/2023 0-5  0 - 5 RBC/hpf Final   WBC, UA 10/24/2023 0-5  0 - 5 WBC/hpf Final   Bacteria, UA 10/24/2023 NONE SEEN  NONE SEEN Final   Squamous Epithelial / HPF 10/24/2023 0-5  0 - 5 /HPF Final   Mucus 10/24/2023 PRESENT   Final   Ca Oxalate Crys, UA 10/24/2023 PRESENT   Final   Performed at [facility], [address] [place], [place] [address]   POC Amphetamine UR 10/24/2023 None Detected  NONE DETECTED (Cut Off Level 1000 ng/mL) Final   POC Secobarbital (BAR) 10/24/2023 None Detected  NONE DETECTED (Cut Off Level 300 ng/mL) Final   POC Buprenorphine  (BUP) 10/24/2023 None Detected  NONE DETECTED (Cut Off Level 10 ng/mL) Final   POC Oxazepam (BZO) 10/24/2023 None Detected  NONE DETECTED (Cut Off Level 300 ng/mL) Final   POC Cocaine UR 10/24/2023 Positive (A)  NONE DETECTED (  Cut Off Level 300 ng/mL) Final   POC Methamphetamine UR 10/24/2023 Positive (A)  NONE DETECTED (Cut Off Level 1000 ng/mL) Final   POC Morphine  10/24/2023 Positive (A)  NONE DETECTED (Cut Off Level 300 ng/mL) Final   POC Methadone UR 10/24/2023 None Detected  NONE DETECTED (Cut Off Level 300 ng/mL) Final   POC Oxycodone  UR 10/24/2023 None Detected  NONE DETECTED (Cut Off Level 100 ng/mL) Final   POC Marijuana UR 10/24/2023 None Detected  NONE DETECTED (Cut Off Level 50 ng/mL) Final   Preg Test, Ur  10/24/2023 Negative  Negative Final   Vit D, 25-Hydroxy 10/24/2023 45.29  30 - 100 ng/mL Final   Comment: (NOTE) Vitamin D  deficiency has been defined by the Institute of Medicine  and an Endocrine Society practice guideline as a level of serum 25-OH  vitamin D  less than 20 ng/mL (1,2). The Endocrine Society went on to  further define vitamin D  insufficiency as a level between 21 and 29  ng/mL (2).  1. IOM (Institute of Medicine). 2010. Dietary reference intakes for  calcium  and D. [place]: [employer]. 2. Holick MF, Binkley [place], Bischoff-Ferrari HA, et al. Evaluation,  treatment, and prevention of vitamin D  deficiency: an Endocrine  Society clinical practice guideline, JCEM. 2011 Jul; 96(7): 1911-30.  Performed at [facility] Lab, [address] [place], [place] [address]    Vitamin B-12 10/24/2023 230  180 - 914 pg/mL Final   Comment: (NOTE) This assay is not validated for testing neonatal or myeloproliferative syndrome specimens for Vitamin B12 levels. Performed at [facility], [address], [place], [place] [address]     Allergies: Patient has no known allergies.  Medications:  Facility Ordered Medications  Medication   acetaminophen  (TYLENOL ) tablet 650 mg   alum & mag hydroxide-simeth (MAALOX/MYLANTA) 200-200-20 MG/5ML suspension 30 mL   magnesium  hydroxide (MILK OF MAGNESIA) suspension 30 mL   diphenhydrAMINE  (BENADRYL ) capsule 50 mg   diphenhydrAMINE  (BENADRYL ) injection 50 mg   And   LORazepam  (ATIVAN ) injection 2 mg   diphenhydrAMINE  (BENADRYL ) injection 50 mg   And   LORazepam  (ATIVAN ) injection 2 mg   hydrOXYzine  (ATARAX ) tablet 25 mg   dicyclomine  (BENTYL ) tablet 20 mg   hydrOXYzine  (ATARAX ) tablet 25 mg   loperamide  (IMODIUM ) capsule 2-4 mg   methocarbamol  (ROBAXIN ) tablet 500 mg   naproxen  (NAPROSYN ) tablet 500 mg   ondansetron  (ZOFRAN -ODT) disintegrating tablet 4 mg   cloNIDine  (CATAPRES ) tablet 0.1 mg   Followed by    NOREEN ON 11/29/2023] cloNIDine  (CATAPRES ) tablet 0.1 mg   Followed by   NOREEN ON 12/01/2023] cloNIDine  (CATAPRES ) tablet 0.1 mg   QUEtiapine (SEROQUEL) tablet 25 mg   QUEtiapine (SEROQUEL) tablet 50 mg   PTA Medications  Medication Sig   buprenorphine -naloxone  (SUBOXONE ) 8-2 mg SUBL SL tablet Place 1 tablet under the tongue daily. (Patient not taking: Reported on 11/27/2023)   hydrOXYzine  (ATARAX ) 25 MG tablet Take 1 tablet (25 mg total) by mouth 3 (three) times daily as needed for anxiety. (Patient not taking: Reported on 11/27/2023)   mirtazapine  (REMERON ) 7.5 MG tablet Take 1 tablet (7.5 mg total) by mouth at bedtime. (Patient not taking: Reported on 11/27/2023)   nicotine  (NICODERM CQ  - DOSED IN MG/24 HOURS) 21 mg/24hr patch Place 1 patch (21 mg total) onto the skin daily at 6 (six) AM. (Patient not taking: Reported on 11/27/2023)    Long Term Goals: Improvement in symptoms so as ready for discharge  Short Term Goals: Patient will verbalize feelings in meetings with treatment team members., Patient will attend at least of 50% of the groups daily., Pt will complete the PHQ9 on admission, day 3 and discharge., Patient will participate in completing the [place] Suicide Severity Rating Scale, Patient will score a low risk of violence for 24 hours prior to discharge, and Patient will take medications as prescribed daily.  Medical Decision Making  Based on my evaluation I certify that psychiatric inpatient services furnished can reasonably be expected to improve the patient's condition which I recommend transfer to an appropriate accepting facility.  -Admitted to the [facility] at the [facility] behavioral health center. -Ordered baseline labs including TSH, hemoglobin A1c, lipid panel, CMP, CBC, EKG. -Ordered COWS protocol with a clonidine  taper, with PRNs accompanying disorder-please see above for details.   Recommendations  Based on my evaluation the patient appears to have an emergency  medical condition for which I recommend the patient be transferred to the emergency department for further evaluation.  Donia Snell, NP 11/28/23  1:26 PM

## 2023-11-27 NOTE — Care Management (Signed)
 [facility] Care Management...  Writer met with patient...  Patient is a 28 year old female with a history of Opioid Use Disorder, moderate, Stimulant Use Disorder, cocaine type, moderate and depression who presents voluntarily to [facility] for assessment.  Patient presents accompanied by her peer support specialist with [facility], Lynnie [phone]), whom she prefers stay in the room for the assessment.  Patient immediately states she was here recently and she wants to be admitted to [facility] for detox.  She then introduces Lynnie, who is a strong advocate and support for patient.  Clinician discussed patients last discharge plan following an [facility] admission. She was scheduled, by FB SW, to go to the [facility] the following Monday ( 10/29/23). Patient shook her head, indicating she did not go to this program and did not seek further treatment.  Per the provider, patient has been offered residential treatment twice and declined.  Lynnie piped in at this point, stating that she would make sure she goes to long term treatment this time.  Lynnie stated she tried to contact [facility] staff, indicating she wanted to visit the patient during her recent [facility] admission.  She states having little involvement with the patient/staff here, she was not aware of any follow up plans. Lynnie again states, This time I will drive her to the treatment program she's accepted to, and she will go!  The patient confirms she is motivated towards treatment and she is committed to completing residential SA treatment following detox. Patient reports she relapsed 2 weeks ago, after having a couple of weeks clean following her [facility] discharge. She reports over the past two weeks she's been using daily, approximately $20 worth of heroin and $20 worth of crack cocaine, mixed, daily.  She describes this as speed balling.   Last use was yesterday evening, when she smoked both, $10 of heroin and $10 of crack cocaine.  Patient indicates she is experiencing some withdrawal symptoms to include sweats, some shaking and abdominal pain.  Again, she is committed to treatment, indicating she'd like to complete detox and be referred for residential SA tx.  She denies SI, HI and AVH.    Clinical research associate... Patient has Engineer, structural has faxed referrals to [facility], [facility] and [facility]  Writer will provide patient with shelter and community resources

## 2023-11-27 NOTE — Group Note (Signed)
 Group Topic: Healthy Self Image and Positive Change  Group Date: 11/27/2023 Start Time: 2000 End Time: 2030 Facilitators: Anice Benton LABOR, NT  Department: [facility]  Number of Participants: 4  Group Focus: communication, feeling awareness/expression, and relapse prevention Treatment Modality:  Solution-Focused Therapy Interventions utilized were group exercise Purpose: increase insight, express feelings   Name: Cassidy Underwood [facility] Date of Birth: 02-Aug-1995  MR: [id number]    Level of Participation: Did Not Attend  Quality of Participation: N/A Interactions with others: N/A Mood/Affect: N/A Triggers (if applicable): N/A Cognition: N/A Progress: None Response: N/A Plan: patient will be encouraged to attend groups   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   Polysubstance dependence (HCC) 05/18/2023   Recurrent major depressive disorder 04/02/2023   Rhabdomyolysis 06/25/2021   HO Drug overdose, accidental or unintentional 06/25/2021   AKI (acute kidney injury) 06/25/2021   Leukocytosis 06/25/2021   Elevated LFTs 06/25/2021   Hyperglycemia 06/25/2021   Hearing loss 06/25/2021   [redacted] weeks gestation of pregnancy 12/17/2018   Normal labor 12/16/2018   Substance induced mood disorder (HCC)    Cocaine use disorder, severe, dependence (HCC) 02/22/2018   Major depressive disorder, single episode, severe without psychotic features (HCC) 02/22/2018   Major depressive disorder, single episode, severe without psychosis (HCC) 02/22/2018   Indication for care in labor or delivery 11/13/2017   Pregnancy with adoption planned 10/05/2017   IUGR  (intrauterine growth restriction) affecting care of mother 10/05/2017   Supervision of high risk pregnancy, antepartum 08/02/2017   Polysubstance abuse (HCC) 07/29/2017   Homelessness 11/02/2016

## 2023-11-27 NOTE — ED Notes (Signed)
 Paitent had lunch.

## 2023-11-27 NOTE — ED Notes (Signed)
 Patient transferred from [facility] to [facility] requesting assistance in detox several substances. Calm, cooperative throughout interview process, patient requesting detox meds, denies pain at this time. Skin assessment completed. Oriented to unit. Meal and drink offered. Patient alert & oriented x4. Denies intent to harm self or others when asked. Denies A/VH. Patient denies any physical complaints when asked but continuously states she's detoxing and has been for several days. No acute distress noted. Support and encouragement provided. Routine safety checks conducted per facility protocol. Encouraged patient to notify staff if any thoughts of harm towards self or others arise. Patient verbalizes understanding and agreement.

## 2023-11-27 NOTE — Care Management (Signed)
 [facility] Care Management ...  Writer received call from [facility] to complete phone interview with patient.   Writer provided number to patient to complete intake [phone]

## 2023-11-27 NOTE — ED Notes (Signed)
 Paitent had dinner.

## 2023-11-27 NOTE — Group Note (Signed)
 Group Topic: Social Support  Group Date: 11/27/2023 Start Time: 1600 End Time: 1630 Facilitators: Olimpia Tinch, Zane HERO, RN  Department: [facility]  Number of Participants: 1  Group Focus: check in Treatment Modality:  Individual Therapy Interventions utilized were support Purpose: express feelings  Name: Cassidy Underwood Date of Birth: [birth year]  MR: [id number]    Level of Participation: moderate Quality of Participation: attentive and cooperative Interactions with others: gave feedback Mood/Affect: appropriate Triggers (if applicable): None identified at this time Cognition: coherent/clear, insightful, and logical Progress: Gaining insight Response: Patient voiced no concerns regarding unit at this time. Support provided. 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   Polysubstance dependence (HCC) 05/18/2023   Recurrent major depressive disorder 04/02/2023   Rhabdomyolysis 06/25/2021   HO Drug overdose, accidental or unintentional 06/25/2021   AKI (acute kidney injury) 06/25/2021   Leukocytosis 06/25/2021   Elevated LFTs 06/25/2021   Hyperglycemia 06/25/2021   Hearing loss 06/25/2021   [redacted] weeks gestation of pregnancy 12/17/2018   Normal labor 12/16/2018   Substance induced mood disorder (HCC)    Cocaine use disorder, severe, dependence (HCC) 02/22/2018   Major depressive disorder, single episode, severe without psychotic features (HCC) 02/22/2018   Major depressive disorder, single episode, severe without psychosis (HCC) 02/22/2018   Indication  for care in labor or delivery 11/13/2017   Pregnancy with adoption planned 10/05/2017   IUGR (intrauterine growth restriction) affecting care of mother 10/05/2017   Supervision of high risk pregnancy, antepartum 08/02/2017   Polysubstance abuse (HCC) 07/29/2017   Homelessness 11/02/2016

## 2023-11-28 ENCOUNTER — Other Ambulatory Visit: Payer: Self-pay

## 2023-11-28 ENCOUNTER — Encounter (HOSPITAL_COMMUNITY): Payer: Self-pay

## 2023-11-28 ENCOUNTER — Inpatient Hospital Stay (HOSPITAL_COMMUNITY)
Admission: AD | Admit: 2023-11-28 | Discharge: 2023-12-03 | DRG: 885 | Disposition: A | Discharge status: Home / Self Care | Source: Intra-hospital

## 2023-11-28 DIAGNOSIS — F1423 Cocaine dependence with withdrawal: Secondary | ICD-10-CM | POA: Diagnosis present

## 2023-11-28 DIAGNOSIS — F1410 Cocaine abuse, uncomplicated: Secondary | ICD-10-CM | POA: Diagnosis present

## 2023-11-28 DIAGNOSIS — Z91199 Patient's noncompliance with other medical treatment and regimen due to unspecified reason: Secondary | ICD-10-CM

## 2023-11-28 DIAGNOSIS — F419 Anxiety disorder, unspecified: Secondary | ICD-10-CM | POA: Diagnosis present

## 2023-11-28 DIAGNOSIS — F332 Major depressive disorder, recurrent severe without psychotic features: Secondary | ICD-10-CM | POA: Diagnosis present

## 2023-11-28 DIAGNOSIS — Z91411 Personal history of adult psychological abuse: Secondary | ICD-10-CM

## 2023-11-28 DIAGNOSIS — Z560 Unemployment, unspecified: Secondary | ICD-10-CM

## 2023-11-28 DIAGNOSIS — Z5900 Homelessness unspecified: Secondary | ICD-10-CM

## 2023-11-28 DIAGNOSIS — Z5941 Food insecurity: Secondary | ICD-10-CM | POA: Diagnosis not present

## 2023-11-28 DIAGNOSIS — H9190 Unspecified hearing loss, unspecified ear: Secondary | ICD-10-CM | POA: Diagnosis present

## 2023-11-28 DIAGNOSIS — F1123 Opioid dependence with withdrawal: Secondary | ICD-10-CM | POA: Diagnosis present

## 2023-11-28 DIAGNOSIS — G4700 Insomnia, unspecified: Secondary | ICD-10-CM | POA: Diagnosis present

## 2023-11-28 DIAGNOSIS — Z5948 Other specified lack of adequate food: Secondary | ICD-10-CM

## 2023-11-28 DIAGNOSIS — F4310 Post-traumatic stress disorder, unspecified: Secondary | ICD-10-CM | POA: Diagnosis present

## 2023-11-28 DIAGNOSIS — Z59868 Other specified financial insecurity: Secondary | ICD-10-CM

## 2023-11-28 DIAGNOSIS — F1124 Opioid dependence with opioid-induced mood disorder: Secondary | ICD-10-CM | POA: Diagnosis not present

## 2023-11-28 DIAGNOSIS — Z79899 Other long term (current) drug therapy: Secondary | ICD-10-CM

## 2023-11-28 DIAGNOSIS — F1414 Cocaine abuse with cocaine-induced mood disorder: Secondary | ICD-10-CM | POA: Diagnosis not present

## 2023-11-28 DIAGNOSIS — Z5982 Transportation insecurity: Secondary | ICD-10-CM

## 2023-11-28 DIAGNOSIS — F1120 Opioid dependence, uncomplicated: Principal | ICD-10-CM

## 2023-11-28 LAB — GC/CHLAMYDIA PROBE AMP (~~LOC~~) NOT AT ARMC
Chlamydia: NEGATIVE
Comment: NEGATIVE
Comment: NORMAL
Neisseria Gonorrhea: NEGATIVE

## 2023-11-28 LAB — RPR: RPR Ser Ql: NONREACTIVE

## 2023-11-28 MED ORDER — NAPROXEN 500 MG PO TABS
500.0000 mg | ORAL_TABLET | Freq: Two times a day (BID) | ORAL | Status: AC | PRN
  Administered 2023-11-29 – 2023-12-01 (×2): 500 mg via ORAL
  Filled 2023-11-28 (×2): qty 1

## 2023-11-28 MED ORDER — MAGNESIUM HYDROXIDE 400 MG/5ML PO SUSP: 30.0000 mL | Freq: Every day | ORAL | Status: DC | PRN

## 2023-11-28 MED ORDER — QUETIAPINE FUMARATE 25 MG PO TABS
25.0000 mg | ORAL_TABLET | Freq: Every day | ORAL | Status: DC
  Administered 2023-11-29: 25 mg via ORAL
  Filled 2023-11-28: qty 1

## 2023-11-28 MED ORDER — DIPHENHYDRAMINE HCL 25 MG PO CAPS: 50.0000 mg | ORAL_CAPSULE | Freq: Three times a day (TID) | ORAL | Status: DC | PRN

## 2023-11-28 MED ORDER — ACETAMINOPHEN 325 MG PO TABS
650.0000 mg | ORAL_TABLET | Freq: Four times a day (QID) | ORAL | Status: DC | PRN
  Administered 2023-12-01 – 2023-12-03 (×2): 650 mg via ORAL
  Filled 2023-11-28 (×2): qty 2

## 2023-11-28 MED ORDER — ACETAMINOPHEN 325 MG PO TABS: 650.0000 mg | ORAL_TABLET | Freq: Four times a day (QID) | ORAL | Status: DC | PRN

## 2023-11-28 MED ORDER — HYDROXYZINE HCL 25 MG PO TABS: 25.0000 mg | ORAL_TABLET | Freq: Three times a day (TID) | ORAL | Status: DC | PRN

## 2023-11-28 MED ORDER — METHOCARBAMOL 500 MG PO TABS
500.0000 mg | ORAL_TABLET | Freq: Three times a day (TID) | ORAL | Status: AC | PRN
  Administered 2023-11-29 – 2023-12-01 (×2): 500 mg via ORAL
  Filled 2023-11-28 (×2): qty 1

## 2023-11-28 MED ORDER — QUETIAPINE FUMARATE 50 MG PO TABS
50.0000 mg | ORAL_TABLET | Freq: Every day | ORAL | Status: DC
  Administered 2023-11-28: 50 mg via ORAL
  Filled 2023-11-28: qty 1

## 2023-11-28 MED ORDER — HYDROXYZINE HCL 25 MG PO TABS
25.0000 mg | ORAL_TABLET | Freq: Four times a day (QID) | ORAL | Status: AC | PRN
  Administered 2023-11-28 – 2023-12-01 (×10): 25 mg via ORAL
  Filled 2023-11-28 (×2): qty 1
  Filled 2023-11-28: qty 20
  Filled 2023-11-28 (×8): qty 1

## 2023-11-28 MED ORDER — ALUM & MAG HYDROXIDE-SIMETH 200-200-20 MG/5ML PO SUSP: 30.0000 mL | ORAL | Status: DC | PRN

## 2023-11-28 MED ORDER — DIPHENHYDRAMINE HCL 50 MG/ML IJ SOLN: 50.0000 mg | Freq: Three times a day (TID) | INTRAMUSCULAR | Status: DC | PRN

## 2023-11-28 MED ORDER — ONDANSETRON 4 MG PO TBDP: 4.0000 mg | ORAL_TABLET | Freq: Four times a day (QID) | ORAL | Status: AC | PRN

## 2023-11-28 MED ORDER — CLONIDINE HCL 0.1 MG PO TABS
0.1000 mg | ORAL_TABLET | Freq: Four times a day (QID) | ORAL | Status: AC
  Administered 2023-11-28 (×2): 0.1 mg via ORAL
  Filled 2023-11-28 (×2): qty 1

## 2023-11-28 MED ORDER — DICYCLOMINE HCL 20 MG PO TABS
20.0000 mg | ORAL_TABLET | Freq: Four times a day (QID) | ORAL | Status: AC | PRN
  Administered 2023-11-28 – 2023-11-29 (×2): 20 mg via ORAL
  Filled 2023-11-28 (×2): qty 1

## 2023-11-28 MED ORDER — LORAZEPAM 2 MG/ML IJ SOLN: 2.0000 mg | Freq: Three times a day (TID) | INTRAMUSCULAR | Status: DC | PRN

## 2023-11-28 MED ORDER — CLONIDINE HCL 0.1 MG PO TABS
0.1000 mg | ORAL_TABLET | ORAL | Status: AC
  Administered 2023-11-29 – 2023-11-30 (×4): 0.1 mg via ORAL
  Filled 2023-11-28 (×4): qty 1

## 2023-11-28 MED ORDER — QUETIAPINE FUMARATE 25 MG PO TABS
25.0000 mg | ORAL_TABLET | Freq: Every day | ORAL | Status: DC
Start: 2023-11-28 — End: 2023-11-28
  Administered 2023-11-28: 25 mg via ORAL
  Filled 2023-11-28: qty 1

## 2023-11-28 MED ORDER — QUETIAPINE FUMARATE 50 MG PO TABS: 50.0000 mg | ORAL_TABLET | Freq: Every day | ORAL | Status: DC

## 2023-11-28 MED ORDER — CLONIDINE HCL 0.1 MG PO TABS
0.1000 mg | ORAL_TABLET | Freq: Every day | ORAL | Status: DC
  Administered 2023-12-01: 0.1 mg via ORAL
  Filled 2023-11-28: qty 1

## 2023-11-28 MED ORDER — LOPERAMIDE HCL 2 MG PO CAPS: 2.0000 mg | ORAL_CAPSULE | ORAL | Status: AC | PRN

## 2023-11-28 NOTE — Care Management (Addendum)
 [facility] Care Management...  Per [place], patient was accepted to [facility] on Thursday 11-29-2023 at 9am.   [address] [place], [place]  [phone]  11:00 am ..SABRAPer providers request...  Patient's appointment has moved/rescheduled to Monday 12/03/23 at 9 am

## 2023-11-28 NOTE — ED Notes (Signed)
 Pt presents as anxious and needy. Pt c/o 8/10 back and body aches, naproxen  tylenol  and robaxin  given. Pt c/o anxiety atarax  given. Pt says she feels 'alright'. Pt denies si hi and avh- verbal contract for safety provided. Pt ate breakfast. Staff checked clothing closet for sweater as requested.

## 2023-11-28 NOTE — ED Notes (Signed)
 Pt ate lunch and snack. Pt presents as anxious and forgetful, asking the same questions within minutes of each other.

## 2023-11-28 NOTE — ED Provider Notes (Addendum)
 Behavioral Health Progress Note  Date and Time: 11/28/2023 1:30 PM Name: Cassidy Underwood [facility] MRN:  [id number]  HPI: Cassidy Underwood is a 28 y.o. female with a history of polysubstance abuse including cocaine, opioids, and fentanyl  presenting to the [facility] today accompanied by her peer support specialist Bart Stacks), requesting physiological management of symptoms related to withdrawals from opioids and cocaine followed by referrals to long-term rehabilitation programs.   Patient assessment, 11/28/2023: Patient reports physiological symptoms of withdrawal today including rhinorrhea, tremors, generalized body aches and malaise, restlessness, and anxiety.  She reports energy level as very low, reports inability to focus and concentrate, reports symptoms consistent with anxiety such as restlessness, overly worrying regarding if she will be able to complete detoxification, yet she is verbalizing motivation to do so in an effort to regain and sustain her sobriety, in an effort to get my life back.  She talks about her children who have been adopted into other families as being her motivation to get better, she hopes to some day reconnect with them.  Patient reports a poor sleep quality last night, shares that the medications given to her last night were ineffective.  As per chart review, trazodone  50 mg was administered last night.  Patient shares that historically, trazodone  does not help her, we discussed discontinuing the trazodone , and adding Seroquel starting with 50 mg tonight for management of insomnia, and also in an effort to stabilize her mood, to which she is receptive.  We talked about also adding Seroquel at a lower dose of 25 mg in the mornings for management of anxiety, to which patient is also receptive.  We discussed initiating Prozac at the dose of 10 mg daily for management of depressive symptoms and GAD, to which patient is receptive as well.   Patient reports that historically, medications were effective, but she stopped her medications multiple years ago when substance abuse became consistent.  She continues to verbalize motivation to get better, yet verbalizing depressive symptoms consisting of anhedonia, decreased concentration levels, feeling overwhelmed, feeling hopeless, helpless, and worthless.   Patient denies suicidal ideations, but presents with severe dysphoria/symptomatology. There is also evidence of impaired self-control, AEB by recurrent substance abuse. She has multiple risk factors for suicide, and few if any protective factors, particularly a lack of social support.  Patient will therefore, benefit from the higher level of care in an inpatient unit such as a behavioral health hospital where her substance use can be treated vis--vis depressive symptoms, prior to presentation to rehabilitation.  Patient already has been approved for the [facility] rehab center on 12/03/2023.  As per CSW, she will need to present there by 9 AM that day.  She was supposed to present there tomorrow 10/16, but Clinical research associate asked for it to be rescheduled due to severe physiological symptoms of opioid abuse still requiring monitoring and treatment.  Peers support specialist for patient (Lynnie Stacks), is asking to be notified at [phone], prior to patient being transferred at to the [facility] treatment center.  Pt with flat affect and depressed mood, attention to personal hygiene and grooming is fair, eye contact is good, speech is clear & coherent. Thought contents are organized and logical, and pt currently denies SI/HI/AVH or paranoia. There is no evidence of delusional thoughts.   Recommendations: Inpatient behavioral health hospitalization for treatment and stabilization of mental status recommended at this time, followed by rehabilitation for substance dependence.  Patient has a bed at the [facility] rehabilitation center with  presentation date of  12/03/2023. She needs to present there at 9 AM in the morning on that day. However, she continues to present with depressive symptoms, and anxiety as well as physiological withdrawal symptoms of substance use, requiring treatment and stabilization in an inpatient unit, which is a higher level of care.  We will therefore seek placement at the [facility] behavioral [facility] inpatient to attain the goals above prior to transfer to rehabilitation.  Diagnosis:  Final diagnoses:  Opioid type dependence, continuous (HCC)  Cocaine abuse (HCC)  MDD (major depressive disorder), recurrent severe, without psychosis (HCC)    Total Time spent with patient: 1.5 hours  Past Psychiatric History: MDD, GAD Past Medical History: Denies Family History: Unsure Family Psychiatric  History: Patient is unsure because she was adopted Social History: Homeless, not working, no source of income, has had 3 children, and they were all adopted out.  Sleep: Poor  Appetite:  Poor  Current Medications:  Current Facility-Administered Medications  Medication Dose Route Frequency Provider Last Rate Last Admin   acetaminophen  (TYLENOL ) tablet 650 mg  650 mg Oral Q6H PRN Tex Drilling, NP   650 mg at 11/28/23 1036   alum & mag hydroxide-simeth (MAALOX/MYLANTA) 200-200-20 MG/5ML suspension 30 mL  30 mL Oral Q4H PRN Tex Drilling, NP       cloNIDine  (CATAPRES ) tablet 0.1 mg  0.1 mg Oral QID Tex Drilling, NP   0.1 mg at 11/28/23 0919   Followed by   NOREEN ON 11/29/2023] cloNIDine  (CATAPRES ) tablet 0.1 mg  0.1 mg Oral BH-qamhs Saleem Coccia, NP       Followed by   NOREEN ON 12/01/2023] cloNIDine  (CATAPRES ) tablet 0.1 mg  0.1 mg Oral QAC breakfast Tex Drilling, NP       dicyclomine  (BENTYL ) tablet 20 mg  20 mg Oral Q6H PRN Tex Drilling, NP   20 mg at 11/28/23 9080   diphenhydrAMINE  (BENADRYL ) capsule 50 mg  50 mg Oral TID PRN Tex Drilling, NP       diphenhydrAMINE  (BENADRYL ) injection 50 mg  50 mg Intramuscular TID  PRN Tex Drilling, NP       And   LORazepam  (ATIVAN ) injection 2 mg  2 mg Intramuscular TID PRN Tex Drilling, NP       diphenhydrAMINE  (BENADRYL ) injection 50 mg  50 mg Intramuscular TID PRN Tex Drilling, NP       And   LORazepam  (ATIVAN ) injection 2 mg  2 mg Intramuscular TID PRN Tex Drilling, NP       hydrOXYzine  (ATARAX ) tablet 25 mg  25 mg Oral TID PRN Tex Drilling, NP       hydrOXYzine  (ATARAX ) tablet 25 mg  25 mg Oral Q6H PRN Tex Drilling, NP   25 mg at 11/28/23 9080   loperamide  (IMODIUM ) capsule 2-4 mg  2-4 mg Oral PRN Tex Drilling, NP       magnesium  hydroxide (MILK OF MAGNESIA) suspension 30 mL  30 mL Oral Daily PRN Tex Drilling, NP       methocarbamol  (ROBAXIN ) tablet 500 mg  500 mg Oral Q8H PRN Tex Drilling, NP   500 mg at 11/28/23 1036   naproxen  (NAPROSYN ) tablet 500 mg  500 mg Oral BID PRN Tex Drilling, NP   500 mg at 11/28/23 9081   ondansetron  (ZOFRAN -ODT) disintegrating tablet 4 mg  4 mg Oral Q6H PRN Tex Drilling, NP       QUEtiapine (SEROQUEL) tablet 25 mg  25 mg Oral Daily Tex Drilling, NP  QUEtiapine (SEROQUEL) tablet 50 mg  50 mg Oral QHS Alverna Fawley, NP       Current Outpatient Medications  Medication Sig Dispense Refill   buprenorphine -naloxone  (SUBOXONE ) 8-2 mg SUBL SL tablet Place 1 tablet under the tongue daily. (Patient not taking: Reported on 11/27/2023) 7 tablet 0   hydrOXYzine  (ATARAX ) 25 MG tablet Take 1 tablet (25 mg total) by mouth 3 (three) times daily as needed for anxiety. (Patient not taking: Reported on 11/27/2023) 30 tablet 0   mirtazapine  (REMERON ) 7.5 MG tablet Take 1 tablet (7.5 mg total) by mouth at bedtime. (Patient not taking: Reported on 11/27/2023) 30 tablet 0   nicotine  (NICODERM CQ  - DOSED IN MG/24 HOURS) 21 mg/24hr patch Place 1 patch (21 mg total) onto the skin daily at 6 (six) AM. (Patient not taking: Reported on 11/27/2023) 28 patch 0    Labs  Lab Results:  Admission on 11/27/2023, Discharged on 11/27/2023   Component Date Value Ref Range Status   WBC 11/27/2023 8.5  4.0 - 10.5 K/uL Final   RBC 11/27/2023 4.93  3.87 - 5.11 MIL/uL Final   Hemoglobin 11/27/2023 14.3  12.0 - 15.0 g/dL Final   HCT 89/85/7974 43.0  36.0 - 46.0 % Final   MCV 11/27/2023 87.2  80.0 - 100.0 fL Final   MCH 11/27/2023 29.0  26.0 - 34.0 pg Final   MCHC 11/27/2023 33.3  30.0 - 36.0 g/dL Final   RDW 89/85/7974 12.3  11.5 - 15.5 % Final   Platelets 11/27/2023 440 (H)  150 - 400 K/uL Final   nRBC 11/27/2023 0.0  0.0 - 0.2 % Final   Neutrophils Relative % 11/27/2023 69  % Final   Neutro Abs 11/27/2023 5.9  1.7 - 7.7 K/uL Final   Lymphocytes Relative 11/27/2023 25  % Final   Lymphs Abs 11/27/2023 2.2  0.7 - 4.0 K/uL Final   Monocytes Relative 11/27/2023 4  % Final   Monocytes Absolute 11/27/2023 0.3  0.1 - 1.0 K/uL Final   Eosinophils Relative 11/27/2023 1  % Final   Eosinophils Absolute 11/27/2023 0.1  0.0 - 0.5 K/uL Final   Basophils Relative 11/27/2023 0  % Final   Basophils Absolute 11/27/2023 0.0  0.0 - 0.1 K/uL Final   Immature Granulocytes 11/27/2023 1  % Final   Abs Immature Granulocytes 11/27/2023 0.05  0.00 - 0.07 K/uL Final   Performed at [facility], [address] [place], [place] [address]   Sodium 11/27/2023 138  135 - 145 mmol/L Final   Potassium 11/27/2023 4.2  3.5 - 5.1 mmol/L Final   Chloride 11/27/2023 102  98 - 111 mmol/L Final   CO2 11/27/2023 25  22 - 32 mmol/L Final   Glucose, Bld 11/27/2023 96  70 - 99 mg/dL Final   Glucose reference range applies only to samples taken after fasting for at least 8 hours.   BUN 11/27/2023 9  6 - 20 mg/dL Final   Creatinine, Ser 11/27/2023 0.60  0.44 - 1.00 mg/dL Final   Calcium  11/27/2023 9.6  8.9 - 10.3 mg/dL Final   Total Protein 89/85/7974 8.0  6.5 - 8.1 g/dL Final   Albumin 89/85/7974 3.8  3.5 - 5.0 g/dL Final   AST 89/85/7974 21  15 - 41 U/L Final   ALT 11/27/2023 19  0 - 44 U/L Final   Alkaline Phosphatase 11/27/2023 62  38 - 126 U/L Final    Total Bilirubin 11/27/2023 0.5  0.0 - 1.2 mg/dL Final  GFR, Estimated 11/27/2023 >60  >60 mL/min Final   Comment: (NOTE) Calculated using the CKD-EPI Creatinine Equation (2021)    Anion gap 11/27/2023 11  5 - 15 Final   Performed at [facility], [address], [place], [place] [address]   Hgb A1c MFr Bld 11/27/2023 5.3  4.8 - 5.6 % Final   Comment: (NOTE) Diagnosis of Diabetes The following HbA1c ranges recommended by the American Diabetes Association (ADA) may be used as an aid in the diagnosis of diabetes mellitus.  Hemoglobin             Suggested A1C NGSP%              Diagnosis  <5.7                   Non Diabetic  5.7-6.4                Pre-Diabetic  >6.4                   Diabetic  <7.0                   Glycemic control for                       adults with diabetes.     Mean Plasma Glucose 11/27/2023 105.41  mg/dL Final   Performed at [facility], [address] [place], [place] [address]   Magnesium  11/27/2023 2.1  1.7 - 2.4 mg/dL Final   Performed at [facility], [address] [place], [place] [address]   Alcohol, Ethyl (B) 11/27/2023 <15  <15 mg/dL Final   Comment: (NOTE) For medical purposes only. Performed at [facility], [address] [place], [place] [address]    Cholesterol 11/27/2023 165  0 - 200 mg/dL Final   Triglycerides 89/85/7974 110  <150 mg/dL Final   HDL 89/85/7974 65  >40 mg/dL Final   Total CHOL/HDL Ratio 11/27/2023 2.5  RATIO Final   VLDL 11/27/2023 22  0 - 40 mg/dL Final   LDL Cholesterol 11/27/2023 78  0 - 99 mg/dL Final   Comment:        Total Cholesterol/HDL:CHD Risk Coronary Heart Disease Risk Table                     Men   Women  1/2 Average Risk   3.4   3.3  Average Risk       5.0   4.4  2 X Average Risk   9.6   7.1  3 X Average Risk  23.4   11.0        Use the calculated Patient Ratio above and the CHD Risk Table to determine the patient's CHD Risk.        ATP III CLASSIFICATION (LDL):   <100     mg/dL   Optimal  899-870  mg/dL   Near or Above                    Optimal  130-159  mg/dL   Borderline  839-810  mg/dL   High  >809     mg/dL   Very High Performed at [facility], [address], [place], [place] [address]    TSH 11/27/2023 0.510  0.350 - 4.500 uIU/mL Final   Comment: Performed by a 3rd Generation assay with a  functional sensitivity of <=0.01 uIU/mL. Performed at [facility], [address], [place], [place] [address]    Neisseria Gonorrhea 11/27/2023 Negative   Final   Chlamydia 11/27/2023 Negative   Final   Comment 11/27/2023 Normal Reference Ranger Chlamydia - Negative   Final   Comment 11/27/2023 Normal Reference Range Neisseria Gonorrhea - Negative   Final   Color, Urine 11/27/2023 YELLOW  YELLOW Final   APPearance 11/27/2023 HAZY (A)  CLEAR Final   Specific Gravity, Urine 11/27/2023 1.023  1.005 - 1.030 Final   pH 11/27/2023 6.0  5.0 - 8.0 Final   Glucose, UA 11/27/2023 NEGATIVE  NEGATIVE mg/dL Final   Hgb urine dipstick 11/27/2023 NEGATIVE  NEGATIVE Final   Bilirubin Urine 11/27/2023 NEGATIVE  NEGATIVE Final   Ketones, ur 11/27/2023 NEGATIVE  NEGATIVE mg/dL Final   Protein, ur 89/85/7974 NEGATIVE  NEGATIVE mg/dL Final   Nitrite 89/85/7974 NEGATIVE  NEGATIVE Final   Leukocytes,Ua 11/27/2023 NEGATIVE  NEGATIVE Final   Performed at [facility], [address] [place], [place] [address]   Preg Test, Ur 11/27/2023 Negative  Negative Final   POC Amphetamine UR 11/27/2023 None Detected  NONE DETECTED (Cut Off Level 1000 ng/mL) Final   POC Secobarbital (BAR) 11/27/2023 None Detected  NONE DETECTED (Cut Off Level 300 ng/mL) Final   POC Buprenorphine  (BUP) 11/27/2023 None Detected  NONE DETECTED (Cut Off Level 10 ng/mL) Final   POC Oxazepam (BZO) 11/27/2023 None Detected  NONE DETECTED (Cut Off Level 300 ng/mL) Final   POC Cocaine UR 11/27/2023 Positive (A)  NONE DETECTED (Cut Off Level 300 ng/mL) Final   POC Methamphetamine UR  11/27/2023 None Detected  NONE DETECTED (Cut Off Level 1000 ng/mL) Final   POC Morphine  11/27/2023 None Detected  NONE DETECTED (Cut Off Level 300 ng/mL) Final   POC Methadone UR 11/27/2023 None Detected  NONE DETECTED (Cut Off Level 300 ng/mL) Final   POC Oxycodone  UR 11/27/2023 None Detected  NONE DETECTED (Cut Off Level 100 ng/mL) Final   POC Marijuana UR 11/27/2023 None Detected  NONE DETECTED (Cut Off Level 50 ng/mL) Final   RPR Ser Ql 11/27/2023 NON REACTIVE  NON REACTIVE Final   Performed at [facility], [address], [place], [place] [address]   HIV Screen 4th Generation wRfx 11/27/2023 Non Reactive  Non Reactive Final   Performed at [facility], [address], [place], [place] [address]  Admission on 10/24/2023, Discharged on 10/24/2023  Component Date Value Ref Range Status   WBC 10/24/2023 8.9  4.0 - 10.5 K/uL Final   RBC 10/24/2023 4.27  3.87 - 5.11 MIL/uL Final   Hemoglobin 10/24/2023 12.4  12.0 - 15.0 g/dL Final   HCT 90/89/7974 37.3  36.0 - 46.0 % Final   MCV 10/24/2023 87.4  80.0 - 100.0 fL Final   MCH 10/24/2023 29.0  26.0 - 34.0 pg Final   MCHC 10/24/2023 33.2  30.0 - 36.0 g/dL Final   RDW 90/89/7974 11.9  11.5 - 15.5 % Final   Platelets 10/24/2023 330  150 - 400 K/uL Final   nRBC 10/24/2023 0.0  0.0 - 0.2 % Final   Neutrophils Relative % 10/24/2023 50  % Final   Neutro Abs 10/24/2023 4.4  1.7 - 7.7 K/uL Final   Lymphocytes Relative 10/24/2023 42  % Final   Lymphs Abs 10/24/2023 3.7  0.7 - 4.0 K/uL Final   Monocytes Relative 10/24/2023 5  % Final   Monocytes Absolute 10/24/2023  0.5  0.1 - 1.0 K/uL Final   Eosinophils Relative 10/24/2023 3  % Final   Eosinophils Absolute 10/24/2023 0.3  0.0 - 0.5 K/uL Final   Basophils Relative 10/24/2023 0  % Final   Basophils Absolute 10/24/2023 0.0  0.0 - 0.1 K/uL Final   Immature Granulocytes 10/24/2023 0  % Final   Abs Immature Granulocytes 10/24/2023 0.03  0.00 - 0.07 K/uL Final   Performed at [facility], [address] [place], [place] [address]   Sodium 10/24/2023 139  135 - 145 mmol/L Final   Potassium 10/24/2023 3.7  3.5 - 5.1 mmol/L Final   Chloride 10/24/2023 102  98 - 111 mmol/L Final   CO2 10/24/2023 29  22 - 32 mmol/L Final   Glucose, Bld 10/24/2023 72  70 - 99 mg/dL Final   Glucose reference range applies only to samples taken after fasting for at least 8 hours.   BUN 10/24/2023 8  6 - 20 mg/dL Final   Creatinine, Ser 10/24/2023 0.66  0.44 - 1.00 mg/dL Final   Calcium  10/24/2023 8.9  8.9 - 10.3 mg/dL Final   Total Protein 90/89/7974 5.7 (L)  6.5 - 8.1 g/dL Final   Albumin 90/89/7974 3.3 (L)  3.5 - 5.0 g/dL Final   AST 90/89/7974 19  15 - 41 U/L Final   ALT 10/24/2023 14  0 - 44 U/L Final   Alkaline Phosphatase 10/24/2023 45  38 - 126 U/L Final   Total Bilirubin 10/24/2023 0.5  0.0 - 1.2 mg/dL Final   GFR, Estimated 10/24/2023 >60  >60 mL/min Final   Comment: (NOTE) Calculated using the CKD-EPI Creatinine Equation (2021)    Anion gap 10/24/2023 8  5 - 15 Final   Performed at [facility], [address] [place], [place] [address]   Hgb A1c MFr Bld 10/24/2023 5.2  4.8 - 5.6 % Final   Comment: (NOTE) Diagnosis of Diabetes The following HbA1c ranges recommended by the American Diabetes Association (ADA) may be used as an aid in the diagnosis of diabetes mellitus.  Hemoglobin             Suggested A1C NGSP%              Diagnosis  <5.7                   Non Diabetic  5.7-6.4                Pre-Diabetic  >6.4                   Diabetic  <7.0                   Glycemic control for                       adults with diabetes.     Mean Plasma Glucose 10/24/2023 102.54  mg/dL Final   Performed at [facility], [address], [place], [place] [address]   Magnesium  10/24/2023 1.9  1.7 - 2.4 mg/dL Final   Performed at [facility], [address] [place], [place] [address]   Alcohol, Ethyl (B) 10/24/2023 <15  <15 mg/dL Final   Comment:  (NOTE) For medical purposes only. Performed at [facility], [address], [place], [place] [address]    Cholesterol 10/24/2023 110  0 - 200 mg/dL Final   Triglycerides 90/89/7974 39  <  150 mg/dL Final   HDL 90/89/7974 48  >40 mg/dL Final   Total CHOL/HDL Ratio 10/24/2023 2.3  RATIO Final   VLDL 10/24/2023 8  0 - 40 mg/dL Final   LDL Cholesterol 10/24/2023 54  0 - 99 mg/dL Final   Comment:        Total Cholesterol/HDL:CHD Risk Coronary Heart Disease Risk Table                     Men   Women  1/2 Average Risk   3.4   3.3  Average Risk       5.0   4.4  2 X Average Risk   9.6   7.1  3 X Average Risk  23.4   11.0        Use the calculated Patient Ratio above and the CHD Risk Table to determine the patient's CHD Risk.        ATP III CLASSIFICATION (LDL):  <100     mg/dL   Optimal  899-870  mg/dL   Near or Above                    Optimal  130-159  mg/dL   Borderline  839-810  mg/dL   High  >809     mg/dL   Very High Performed at [facility], [address], [place], [place] [address]    TSH 10/24/2023 1.765  0.350 - 4.500 uIU/mL Final   Comment: Performed by a 3rd Generation assay with a functional sensitivity of <=0.01 uIU/mL. Performed at [facility], [address], [place], [place] [address]    Color, Urine 10/24/2023 YELLOW  YELLOW Final   APPearance 10/24/2023 HAZY (A)  CLEAR Final   Specific Gravity, Urine 10/24/2023 1.024  1.005 - 1.030 Final   pH 10/24/2023 5.0  5.0 - 8.0 Final   Glucose, UA 10/24/2023 NEGATIVE  NEGATIVE mg/dL Final   Hgb urine dipstick 10/24/2023 SMALL (A)  NEGATIVE Final   Bilirubin Urine 10/24/2023 NEGATIVE  NEGATIVE Final   Ketones, ur 10/24/2023 NEGATIVE  NEGATIVE mg/dL Final   Protein, ur 90/89/7974 NEGATIVE  NEGATIVE mg/dL Final   Nitrite 90/89/7974 NEGATIVE  NEGATIVE Final   Leukocytes,Ua 10/24/2023 NEGATIVE  NEGATIVE Final   RBC / HPF 10/24/2023 0-5  0 - 5 RBC/hpf Final   WBC, UA 10/24/2023 0-5  0 - 5 WBC/hpf Final    Bacteria, UA 10/24/2023 NONE SEEN  NONE SEEN Final   Squamous Epithelial / HPF 10/24/2023 0-5  0 - 5 /HPF Final   Mucus 10/24/2023 PRESENT   Final   Ca Oxalate Crys, UA 10/24/2023 PRESENT   Final   Performed at [facility], [address] [place], [place] [address]   POC Amphetamine UR 10/24/2023 None Detected  NONE DETECTED (Cut Off Level 1000 ng/mL) Final   POC Secobarbital (BAR) 10/24/2023 None Detected  NONE DETECTED (Cut Off Level 300 ng/mL) Final   POC Buprenorphine  (BUP) 10/24/2023 None Detected  NONE DETECTED (Cut Off Level 10 ng/mL) Final   POC Oxazepam (BZO) 10/24/2023 None Detected  NONE DETECTED (Cut Off Level 300 ng/mL) Final   POC Cocaine UR 10/24/2023 Positive (A)  NONE DETECTED (Cut Off Level 300 ng/mL) Final   POC Methamphetamine UR 10/24/2023 Positive (A)  NONE DETECTED (Cut Off Level 1000 ng/mL) Final   POC Morphine  10/24/2023 Positive (A)  NONE DETECTED (Cut Off Level 300 ng/mL) Final   POC Methadone UR 10/24/2023  None Detected  NONE DETECTED (Cut Off Level 300 ng/mL) Final   POC Oxycodone  UR 10/24/2023 None Detected  NONE DETECTED (Cut Off Level 100 ng/mL) Final   POC Marijuana UR 10/24/2023 None Detected  NONE DETECTED (Cut Off Level 50 ng/mL) Final   Preg Test, Ur 10/24/2023 Negative  Negative Final   Vit D, 25-Hydroxy 10/24/2023 45.29  30 - 100 ng/mL Final   Comment: (NOTE) Vitamin D  deficiency has been defined by the Institute of Medicine  and an Endocrine Society practice guideline as a level of serum 25-OH  vitamin D  less than 20 ng/mL (1,2). The Endocrine Society went on to  further define vitamin D  insufficiency as a level between 21 and 29  ng/mL (2).  1. IOM (Institute of Medicine). 2010. Dietary reference intakes for  calcium  and D. [place]: [employer]. 2. Holick MF, Binkley [place], Bischoff-Ferrari HA, et al. Evaluation,  treatment, and prevention of vitamin D  deficiency: an Endocrine  Society clinical practice guideline,  JCEM. 2011 Jul; 96(7): 1911-30.  Performed at [facility], [address], [place], [place] [address]    Vitamin B-12 10/24/2023 230  180 - 914 pg/mL Final   Comment: (NOTE) This assay is not validated for testing neonatal or myeloproliferative syndrome specimens for Vitamin B12 levels. Performed at [facility], [address], [place], [place] [address]     Blood Alcohol level:  Lab Results  Component Value Date   [facility] <15 11/27/2023   ETH <15 10/24/2023    Metabolic Disorder Labs: Lab Results  Component Value Date   HGBA1C 5.3 11/27/2023   MPG 105.41 11/27/2023   MPG 102.54 10/24/2023   No results found for: PROLACTIN Lab Results  Component Value Date   CHOL 165 11/27/2023   TRIG 110 11/27/2023   HDL 65 11/27/2023   CHOLHDL 2.5 11/27/2023   VLDL 22 11/27/2023   LDLCALC 78 11/27/2023   LDLCALC 54 10/24/2023    Therapeutic Lab Levels: No results found for: LITHIUM No results found for: VALPROATE No results found for: CBMZ  Physical Findings   AIMS    Flowsheet Row Admission (Discharged) from 02/22/2018 in [facility] INPATIENT BEHAVIORAL MEDICINE  AIMS Total Score 0   AUDIT    Flowsheet Row Admission (Discharged) from 02/22/2018 in [facility] INPATIENT BEHAVIORAL MEDICINE  Alcohol Use Disorder Identification Test Final Score (AUDIT) 8   PHQ2-9    Flowsheet Row ED from 11/27/2023 in [facility] Most recent reading at 11/28/2023  1:09 PM ED from 11/27/2023 in [facility] Most recent reading at 11/27/2023 12:40 PM ED from 10/24/2023 in [facility] Most recent reading at 10/27/2023  4:00 PM ED from 10/24/2023 in [facility] Most recent reading at 10/24/2023  1:26 PM ED from 05/18/2023 in [facility] Most recent reading at 05/23/2023  9:47 AM  PHQ-2 Total Score 2 2 2 2 3   PHQ-9 Total Score 9 9 4 7 8    Flowsheet  Row ED from 11/27/2023 in [facility] Most recent reading at 11/27/2023  1:25 PM ED from 11/27/2023 in [facility] [facility] Most recent reading at 11/27/2023 10:50 AM ED from 10/24/2023 in [employer] Most recent reading at 10/24/2023  1:38 PM  C-SSRS RISK CATEGORY No Risk No Risk No Risk     Musculoskeletal  Strength & Muscle Tone: within normal limits Gait &  Station: normal Patient leans: N/A  Psychiatric Specialty Exam  Presentation  General Appearance:  Casual  Eye Contact: Fair  Speech: Clear and Coherent  Speech Volume: Normal  Handedness: Right   Mood and Affect  Mood: Anxious  Affect: Depressed   Thought Process  Thought Processes: Coherent  Descriptions of Associations:Intact  Orientation:Full (Time, Place and Person)  Thought Content:Logical; WDL  Diagnosis of Schizophrenia or Schizoaffective disorder in past: No    Hallucinations:Hallucinations: None  Ideas of Reference:None  Suicidal Thoughts:Suicidal Thoughts: No  Homicidal Thoughts:Homicidal Thoughts: No   Sensorium  Memory: Immediate Fair  Judgment: Fair  Insight: Fair   Art therapist  Concentration: Fair  Attention Span: Fair  Recall: [employer] of Knowledge: Fair  Language: Fair   Psychomotor Activity  Psychomotor Activity: Psychomotor Activity: Normal   Assets  Assets: Communication Skills; Social Support   Sleep  Sleep: Sleep: Poor  Estimated Sleeping Duration (Last 24 Hours): 11.75-12.25 hours  Nutritional Assessment (For OBS and [facility] admissions only) Has the patient had a weight loss or gain of 10 pounds or more in the last 3 months?: Yes Has the patient had a decrease in food intake/or appetite?: Yes Does the patient have dental problems?: No Does the patient have eating habits or behaviors that may be indicators of an eating disorder including binging or  inducing vomiting?: No Has the patient recently lost weight without trying?: 0 Has the patient been eating poorly because of a decreased appetite?: 0 Malnutrition Screening Tool Score: 0    Physical Exam  Physical Exam Constitutional:      Appearance: Normal appearance.  Eyes:     Pupils: Pupils are equal, round, and reactive to light.  Musculoskeletal:     Cervical back: Normal range of motion.  Neurological:     General: No focal deficit present.     Mental Status: She is alert and oriented to person, place, and time.    Review of Systems  Psychiatric/Behavioral:  Positive for depression and substance abuse. Negative for hallucinations, memory loss and suicidal ideas. The patient is nervous/anxious and has insomnia.   All other systems reviewed and are negative.  Blood pressure 116/66, pulse 82, temperature 98.4 F (36.9 C), temperature source Oral, resp. rate 17, SpO2 100%. There is no height or weight on file to calculate BMI.  Treatment Plan Summary: Daily contact with patient to assess and evaluate symptoms and progress in treatment and Medication management  Medications: - Start Seroquel 25 mg in the mornings, and 50 mg nightly for mood stabilization - Continue clonidine  taper for opioid use disorder as per the [facility] - Continue agitation protocol: Ativan /Haldol /Benadryl  as per the [facility] as needed- -discontinue trazodone  as per patient's request  Labs reviewed: No new orders placed  Donia Snell, NP 11/28/2023 1:30 PM

## 2023-11-28 NOTE — Progress Notes (Signed)
   11/28/23 1700  Psych Admission Type (Psych Patients Only)  Admission Status Voluntary  Psychosocial Assessment  Patient Complaints Substance abuse  Eye Contact Darting  Facial Expression Sad  Affect Appropriate to circumstance  Speech Logical/coherent  Interaction Assertive  Motor Activity Other (Comment) (wnl for pt)  Appearance/Hygiene Poor hygiene  Behavior Characteristics Cooperative  Mood Pleasant  Thought Process  Coherency WDL  Content WDL  Delusions None reported or observed  Perception WDL  Hallucination None reported or observed  Judgment WDL  Confusion None  Danger to Self  Current suicidal ideation? Denies  Description of Suicide Plan no  Agreement Not to Harm Self Yes  Description of Agreement verbal  Danger to Others  Danger to Others None reported or observed

## 2023-11-28 NOTE — ED Notes (Signed)
 Pt is sleeping at the moment. No acute distress noted. Q15 safety checks in place.

## 2023-11-28 NOTE — Progress Notes (Signed)
 Pt has been accepted to [facility] on 11/28/2023 Bed assignment: 303-01  Pt meets inpatient criteria per: Donia Snell NP  Attending Physician will be: Dr. Prentis    Report can be called to: unit:Adult unit: [phone]  Pt can arrive after [facility] WILL UPDATE   Care Team Notified: [facility] Cherylynn Ernst RN, Donia Snell NP  [place] Raydell Maners LCSW-A   11/28/2023 2:23 PM

## 2023-11-28 NOTE — Progress Notes (Signed)
 Pt admitted to unit, A&O x4, denies SI, HI and AVH at this time. Pt states that she is here to  get help coming off drugs. Pt states that she has been using fentanyl  and crack cocaine, she is homeless and has no one to help her get clean. Reviewed unit schedule and rules, pt verbalized understanding. Patient in her room eating a snack, safety checks in place, pt denies needs at this time.

## 2023-11-28 NOTE — ED Notes (Signed)
 Discharge/ transfer to [facility] discussed with pt, pt expressed understanding. Pt was escorted off unit by staff, items sent with her.

## 2023-11-28 NOTE — ED Notes (Signed)
 Pt did attend the peer support group.

## 2023-11-28 NOTE — Plan of Care (Signed)
  Problem: Education: Goal: Knowledge of disease or condition will improve Outcome: Not Progressing   Problem: Safety: Goal: Ability to remain free from injury will improve Outcome: Not Progressing

## 2023-11-28 NOTE — ED Notes (Signed)
 Pt is sleeping at the moment. No acute distress noted. Respirations are even and labored. Q15 safety checks in place.

## 2023-11-28 NOTE — Discharge Instructions (Addendum)
 Please transfer patient to higher level of care at the [facility]  Pt has been accepted to [facility] on 11/28/2023 Bed assignment: 303-01   Pt meets inpatient criteria per: Donia Snell NP   Attending Physician will be: Dr. Prentis     Report can be called to: unit:Adult unit: [phone]   Pt can arrive after [facility] WILL UPDATE    Care Team Notified: [facility] Cherylynn Ernst RN, Donia Snell NP   [place] LCSW-A    11/28/2023 2:23 PM

## 2023-11-28 NOTE — Group Note (Signed)
 Group Topic: Wellness  Group Date: 11/28/2023 Start Time: 1200 End Time: 1230 Facilitators: Daved Tinnie HERO, RN  Department: [facility]  Number of Participants: 6 Group Focus: psychiatric education Treatment Modality:  Psychoeducation Interventions utilized were patient education Purpose: increase insight  Name: Cassidy Underwood Date of Birth: [birth year]  MR: [id number]    Level of Participation: moderate Quality of Participation: cooperative Interactions with others: gave feedback Mood/Affect: appropriate Triggers (if applicable): n/a Cognition: coherent/clear Progress: Gaining insight Response: RN discussed medications, pt affirmed understanding, denied further questions Plan: patient will be encouraged to attend future RN education groups  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   Polysubstance dependence (HCC) 05/18/2023   Recurrent major depressive disorder 04/02/2023   Rhabdomyolysis 06/25/2021   HO Drug overdose, accidental or unintentional 06/25/2021   AKI (acute kidney injury) 06/25/2021   Leukocytosis 06/25/2021   Elevated LFTs 06/25/2021   Hyperglycemia 06/25/2021   Hearing loss 06/25/2021   [redacted] weeks gestation of pregnancy 12/17/2018   Normal labor 12/16/2018   Substance induced mood disorder (HCC)    Cocaine use disorder, severe, dependence (HCC) 02/22/2018   Major depressive disorder, single episode, severe without psychotic features (HCC) 02/22/2018   Major depressive disorder, single episode, severe without psychosis (HCC) 02/22/2018   Indication for care in labor or delivery 11/13/2017   Pregnancy with adoption  planned 10/05/2017   IUGR (intrauterine growth restriction) affecting care of mother 10/05/2017   Supervision of high risk pregnancy, antepartum 08/02/2017   Polysubstance abuse (HCC) 07/29/2017   Homelessness 11/02/2016

## 2023-11-28 NOTE — ED Notes (Signed)
 Pt assessed in the nursing station. Denies SI/HI/AVH. Complains of shaking, and generalized body pains. Medication will be administered .

## 2023-11-29 MED ORDER — NICOTINE POLACRILEX 2 MG MT GUM
2.0000 mg | CHEWING_GUM | OROMUCOSAL | Status: DC | PRN
  Administered 2023-11-29 – 2023-11-30 (×2): 2 mg via ORAL

## 2023-11-29 MED ORDER — QUETIAPINE FUMARATE 100 MG PO TABS
100.0000 mg | ORAL_TABLET | Freq: Every day | ORAL | Status: DC
  Administered 2023-11-29 – 2023-12-01 (×3): 100 mg via ORAL
  Filled 2023-11-29 (×3): qty 1

## 2023-11-29 MED ORDER — QUETIAPINE FUMARATE 50 MG PO TABS
50.0000 mg | ORAL_TABLET | Freq: Every day | ORAL | Status: DC
  Administered 2023-11-30 – 2023-12-02 (×3): 50 mg via ORAL
  Filled 2023-11-29 (×3): qty 1

## 2023-11-29 MED ORDER — MELATONIN 5 MG PO TABS
5.0000 mg | ORAL_TABLET | Freq: Every day | ORAL | Status: DC
  Administered 2023-11-29 – 2023-11-30 (×2): 5 mg via ORAL
  Filled 2023-11-29 (×2): qty 1

## 2023-11-29 MED ORDER — NICOTINE 21 MG/24HR TD PT24
21.0000 mg | MEDICATED_PATCH | Freq: Every day | TRANSDERMAL | Status: DC
  Administered 2023-12-01 – 2023-12-03 (×3): 21 mg via TRANSDERMAL
  Filled 2023-11-29 (×3): qty 1
  Filled 2023-11-29: qty 14

## 2023-11-29 MED ORDER — MIRTAZAPINE 7.5 MG PO TABS
7.5000 mg | ORAL_TABLET | Freq: Every day | ORAL | Status: DC
  Administered 2023-11-29 – 2023-12-02 (×4): 7.5 mg via ORAL
  Filled 2023-11-29 (×3): qty 1
  Filled 2023-11-29: qty 14
  Filled 2023-11-29: qty 1

## 2023-11-29 MED ORDER — ENSURE PLUS HIGH PROTEIN PO LIQD
237.0000 mL | Freq: Two times a day (BID) | ORAL | Status: DC
  Administered 2023-11-30 – 2023-12-02 (×6): 237 mL via ORAL
  Filled 2023-11-29 (×7): qty 237

## 2023-11-29 MED ORDER — PRAZOSIN HCL 1 MG PO CAPS
1.0000 mg | ORAL_CAPSULE | Freq: Every day | ORAL | Status: DC
  Administered 2023-11-29 – 2023-11-30 (×2): 1 mg via ORAL
  Filled 2023-11-29 (×2): qty 1

## 2023-11-29 NOTE — H&P (Addendum)
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 without psychosis, homelessness, polysubstance abuse, substance-induced mood disorder, history of suicide attempts.  Patient presents voluntarily to [facility] from [facility] for physiological management of symptoms related to opioids and cocaine withdrawal.  BAL: Less than 15, UDS: Positive for cocaine, amphetamine, and morphine .  Physician Treatment Plan for Primary Diagnosis: MDD (major depressive disorder), recurrent severe, without psychosis  (HCC)  Plans: Medications: --Increase Seroquel tablets from 25 mg to 50 mg p.o. daily for mood stabilization --Increase Seroquel tablet from 50 mg to 100 mg p.o. q. nightly for mood stabilization -- Initiate melatonin 5 mg p.o. q. nightly as needed for insomnia -- Resume home medication Remeron  7.5 mg p.o. q. nightly for depression and sleep -- Initiate Prazosin 1 mg p.o. q. nightly for PTSD  --[facility]HH agitation protocol as recommended  --[facility]HH Clonidine  (Catapres ) protocol as recommended  Medications for other medical problems: --Resume home med nicotine  patch 21 mg transdermal every 24 hours for smoking cessation --Initiate nicotine  gum 2 mg p.o. as needed for smoking cessation  Other PRN Medications  -Acetaminophen  650 mg every 6 as needed/mild pain  -Maalox 30 mL oral every 4 as needed/digestion  -  Magnesium  hydroxide 30 mL daily as needed/mild constipation   --The risks/benefits/side-effects/alternatives to this medication were discussed in detail with the patient and time was given for questions. The patient consents to medication trial.   -- Metabolic profile and EKG monitoring obtained while on an atypical antipsychotic (BMI: Lipid Panel: HbgA1c: QTc:)   -- Encouraged patient to participate in unit milieu and in scheduled group therapies   Safety and Monitoring:  Voluntary admission to inpatient psychiatric unit for safety, stabilization and treatment  Daily contact with patient to assess and evaluate symptoms and progress in treatment  Patient's case to be discussed in multi-disciplinary team meeting  Observation Level : q15 minute checks  Vital signs: q12 hours  Precautions: suicide, but pt currently verbally contracts for safety on unit?   Discharge Planning:  Social work and case management to assist with discharge planning and identification of hospital follow-up needs prior to discharge  Estimated LOS: 5-7?days  Discharge Concerns: Need to establish Safety plan;  Medication compliance and effectiveness  Discharge Goals: Return home with outpatient referrals for mental health follow-up including medication management/psychotherapy.   Long Term Goal(s): Improvement in symptoms so as ready for discharge  Short Term Goals: Ability to identify changes in lifestyle to reduce recurrence of condition will improve, Ability to verbalize feelings will improve, Ability to disclose and discuss suicidal ideas, Ability to demonstrate self-control will improve, Ability to identify and develop effective coping behaviors will improve, Ability to maintain clinical measurements within normal limits will improve, Compliance with prescribed medications will improve, and Ability to identify triggers associated with substance abuse/mental health issues will improve  Physician Treatment Plan for Secondary Diagnosis: Principal Problem:   MDD (major depressive disorder), recurrent severe, without psychosis (HCC)  I certify that inpatient services furnished can reasonably be expected to improve the patient's condition.    Ellouise JAYSON Azure, FNP 10/16/202510:57 AM

## 2023-11-29 NOTE — Group Note (Signed)
 Date:  11/29/2023 Time:  9:15 PM  Group Topic/Focus:  Wrap-Up Group:   The focus of this group is to help patients review their daily goal of treatment and discuss progress on daily workbooks.    Participation Level:  Did Not Attend  Participation Quality:  did not attend group  Affect:  Resistant  Cognitive:  Lacking  Insight: None  Engagement in Group:  None  Modes of Intervention:  Discussion  Additional Comments:  Patient did not attend group this evening   Bari Moats 11/29/2023, 9:15 PM

## 2023-11-29 NOTE — Plan of Care (Signed)
  Problem: Education: Goal: Emotional status will improve Outcome: Progressing Goal: Verbalization of understanding the information provided will improve Outcome: Progressing   Problem: Activity: Goal: Sleeping patterns will improve Outcome: Progressing   Problem: Coping: Goal: Ability to demonstrate self-control will improve Outcome: Progressing

## 2023-11-29 NOTE — Progress Notes (Signed)
 Adult Psychoeducational Group Note  Date:  11/29/2023 Time:  10:36 AM  Group Topic/Focus:  Goals Group:   The focus of this group is to help patients establish daily goals to achieve during treatment and discuss how the patient can incorporate goal setting into their daily lives to aide in recovery.  Participation Level:  Active  Participation Quality:  Appropriate  Affect:  Appropriate  Cognitive:  Appropriate  Insight: Appropriate  Engagement in Group:  Engaged  Modes of Intervention:  Discussion  Additional Comments:  Pt stated her goal for the day is to be discharged.  Daine Pillar D 11/29/2023, 10:36 AM
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
## 2023-11-29 NOTE — Group Note (Signed)
 LCSW Group Therapy Note   Group Date: 11/29/2023 Start Time: 1100 End Time: 1200   Participation:  patient was present.  She listened and was respectful but didn't participate in the discussion.  Type of Therapy:  Group Therapy  Topic:  Shining from Within:  Confidence and Self-Love Journey  Objective:  To support participants in developing confidence and self-love through self-awareness, self-compassion, and practical skills that nurture personal growth.   Group Goals Encourage self-reflection and self-acceptance by identifying personal strengths and achievements. Teach skills to challenge negative self-talk and replace it with supportive, truthful self-talk. Foster resilience and self-worth through [employer], gratitude, and self-care practices.   Summary:  This group explores the connection between confidence and self-love by guiding participants through reflection, mindset shifts, and practical tools like affirmations, strength recognition, and goal-setting. Activities are designed to promote self-compassion, build emotional resilience, and normalize the slow, patient journey of inner growth.   Therapeutic Modalities Used: Cognitive Behavioral Therapy (CBT): Challenging and reframing unhelpful self-talk. Motivational Interviewing (MI): Encouraging small, achievable goals. Elements of Dialectical Behavioral Therapist (DBT):  Mindfulness and Self-Compassion: Promoting present-moment awareness and kindness toward self.   Cassidy Underwood O Rayme Bui, LCSWA 11/29/2023  12:22 PM

## 2023-11-29 NOTE — Group Note (Signed)
 Occupational Therapy Group Note  Group Topic:Coping Skills  Group Date: 11/29/2023 Start Time: 1500 End Time: 1533 Facilitators: Dot [place] MATSU, OT   Group Description: Group encouraged increased engagement and participation through discussion and activity focused on Coping Ahead. Patients were split up into teams and selected a card from a stack of positive coping strategies. Patients were instructed to act out/charade the coping skill for other peers to guess and receive points for their team. Discussion followed with a focus on identifying additional positive coping strategies and patients shared how they were going to cope ahead over the weekend while continuing hospitalization stay.  Therapeutic Goal(s): Identify positive vs negative coping strategies. Identify coping skills to be used during hospitalization vs coping skills outside of hospital/at home Increase participation in therapeutic group environment and promote engagement in treatment   Participation Level: Engaged   Participation Quality: Independent   Behavior: Appropriate   Speech/Thought Process: Relevant   Affect/Mood: Appropriate   Insight: Fair   Judgement: Fair      Modes of Intervention: Education  Patient Response to Interventions:  Attentive   Plan: Continue to engage patient in OT groups 2 - 3x/week.  11/29/2023  [place] MATSU Dot, OT  Adelle Zachar, OT

## 2023-11-29 NOTE — Plan of Care (Signed)
   Problem: Education: Goal: Knowledge of [facility] Education information/materials will improve Outcome: Progressing Goal: Emotional status will improve Outcome: Progressing Goal: Mental status will improve Outcome: Progressing Goal: Verbalization of understanding the information provided will improve Outcome: Progressing

## 2023-11-29 NOTE — [facility] Suicide Risk Assessment (Addendum)
 Suicide Risk Assessment  Admission Assessment    [facility] Admission Suicide Risk Assessment   Nursing information obtained from:  Patient, Review of record Demographic factors:  Caucasian, Low socioeconomic status, Living alone Current Mental Status:  NA Loss Factors:  Decrease in vocational status, Financial problems / change in socioeconomic status Historical Factors:  Impulsivity Risk Reduction Factors:  NA  Total Time spent with patient: 45 minutes  Principal Problem: MDD (major depressive disorder), recurrent severe, without psychosis (HCC) Diagnosis:  Principal Problem:   MDD (major depressive disorder), recurrent severe, without psychosis (HCC)  Subjective Data: Cassidy Underwood is an unhoused 28 year old Caucasian female with prior psychiatric history significant for MDD recurrent severe, without psychosis, homelessness, polysubstance abuse, substance-induced mood disorder, history of suicide attempts.  Patient presents voluntarily to [facility] from [facility] for physiological management of symptoms related to opioids and cocaine withdrawal.  BAL: Less than 15, UDS: Positive for cocaine, amphetamine, and morphine .  Continued Clinical Symptoms:  Alcohol Use Disorder Identification Test Final Score (AUDIT): 0 The Alcohol Use Disorders Identification Test, Guidelines for Use in Primary Care, Second Edition.  World Science writer [facility]). Score between 0-7:  no or low risk or alcohol related problems. Score between 8-15:  moderate risk of alcohol related problems. Score between 16-19:  high risk of alcohol related problems. Score 20 or above:  warrants further diagnostic evaluation for alcohol dependence and treatment.  CLINICAL FACTORS:   Severe Anxiety and/or Agitation Depression:   Anhedonia Comorbid alcohol abuse/dependence Hopelessness Insomnia Severe Alcohol/Substance Abuse/Dependencies More than one psychiatric  diagnosis Previous Psychiatric Diagnoses and Treatments Medical Diagnoses and Treatments/Surgeries  Musculoskeletal: Strength & Muscle Tone: within normal limits Gait & Station: normal Patient leans: N/A  Psychiatric Specialty Exam:  Presentation  General Appearance:  Appropriate for Environment; Disheveled  Eye Contact: Fair  Speech: Clear and Coherent  Speech Volume: Normal  Handedness: Right  Mood and Affect  Mood: Anxious; Depressed  Affect: Depressed; Congruent  Thought Process  Thought Processes: Coherent  Descriptions of Associations:Intact  Orientation:Full (Time, Place and Person)  Thought Content:Logical  History of Schizophrenia/Schizoaffective disorder:No  Duration of Psychotic Symptoms:No data recorded Hallucinations:Hallucinations: None  Ideas of Reference:None  Suicidal Thoughts:Suicidal Thoughts: No  Homicidal Thoughts:Homicidal Thoughts: No  Sensorium  Memory: Immediate Fair; Recent Fair  Judgment: Fair  Insight: Fair  Executive Functions  Concentration: Good  Attention Span: Good  Recall: Fair  Fund of Knowledge: Fair  Language: Fair  Psychomotor Activity  Psychomotor Activity: Psychomotor Activity: Normal  Assets  Assets: Communication Skills; Physical Health; Resilience; Social Support  Sleep  Sleep: Sleep: Fair  Physical Exam: Physical Exam Vitals and nursing note reviewed.  Constitutional:      General: She is not in acute distress.    Appearance: She is not ill-appearing.  HENT:     Head: Normocephalic.     Right Ear: External ear normal.     Left Ear: External ear normal.     Nose: Nose normal.     Mouth/Throat:     Mouth: Mucous membranes are moist.     Pharynx: Oropharynx is clear.  Eyes:     Extraocular Movements: Extraocular movements intact.  Cardiovascular:     Rate and Rhythm: Normal rate.     Pulses: Normal pulses.  Pulmonary:     Effort: Pulmonary effort is normal. No  respiratory distress.  Abdominal:     Comments: Deferred  Genitourinary:    Comments: Deferred Musculoskeletal:  General: Normal range of motion.     Cervical back: Normal range of motion.  Skin:    General: Skin is dry.  Neurological:     General: No focal deficit present.     Mental Status: She is alert and oriented to person, place, and time.  Psychiatric:        Mood and Affect: Mood normal.    Review of Systems  Constitutional:  Negative for chills and fever.  HENT:  Negative for sore throat.   Eyes:  Negative for blurred vision.  Respiratory:  Negative for cough, sputum production, shortness of breath and wheezing.   Cardiovascular:  Negative for chest pain and palpitations.  Gastrointestinal:  Negative for abdominal pain, constipation, diarrhea, heartburn, nausea and vomiting.  Genitourinary:  Negative for dysuria.  Musculoskeletal:  Negative for falls.  Skin:  Negative for itching and rash.  Neurological:  Negative for dizziness and headaches.  Endo/Heme/Allergies:        See allergy listing  Psychiatric/Behavioral:  Positive for depression and substance abuse. Negative for suicidal ideas. The patient is nervous/anxious and has insomnia.    Blood pressure 101/62, pulse 60, temperature 98.1 F (36.7 C), temperature source Oral, resp. rate 20, height 4' 8 (1.422 m), weight 39.9 kg, SpO2 100%. Body mass index is 19.73 kg/m.  COGNITIVE FEATURES THAT CONTRIBUTE TO RISK:  Polarized thinking    SUICIDE RISK:   Moderate:  Frequent suicidal ideation with limited intensity, and duration, some specificity in terms of plans, no associated intent, good self-control, limited dysphoria/symptomatology, some risk factors present, and identifiable protective factors, including available and accessible social support.  PLAN OF CARE: Treatment Plan Summary: Daily contact with patient to assess and evaluate symptoms and progress in treatment and Medication  management  Observation Level/Precautions:  15 minute checks  Laboratory:   CBC: Platelet 440 elevated, otherwise normal Chemistry Profile: Within normal limits Folic Acid : Not applicable GGT: Not applicable HbAIC: 5.3 within normal limits HCG: Urine pregnancy test negative UDS: Positive for cocaine, meth amphetamine, and morphine  UA: Vitamin B-12: 230 within normal limits TSH: 0.510 within normal limits Lipid panel: Within normal limits Vitamin D  25-hydroxy: 45.29 within normal limits  EKG: NSR, ventricular rate 67, QT/QTc 386/407  Psychotherapy: Therapeutic milieu  Medications: See MAR  Consultations: Pending  Discharge Concerns: Safety  Estimated LOS: 3 to 7 days  Other:     Assessment: Cassidy Underwood is an unhoused 28 year old Caucasian female with prior psychiatric history significant for MDD recurrent severe, without psychosis, homelessness, polysubstance abuse, substance-induced mood disorder, history of suicide attempts.  Patient presents voluntarily to [facility] from [facility] for physiological management of symptoms related to opioids and cocaine withdrawal.  BAL: Less than 15, UDS: Positive for cocaine, amphetamine, and morphine .  Physician Treatment Plan for Primary Diagnosis: MDD (major depressive disorder), recurrent severe, without psychosis (HCC)  Plans: Medications: --Increase Seroquel tablets from 25 mg to 50 mg p.o. daily for mood stabilization --Increase Seroquel tablet from 50 mg to 100 mg p.o. q. nightly for mood stabilization -- Initiate melatonin 5 mg p.o. q. nightly as needed for insomnia -- Resume home medication Remeron  7.5 mg p.o. q. nightly for depression and sleep -- Initiate Prazosin 1 mg p.o. q. nightly for PTSD  --[facility] agitation protocol as recommended  --[facility] Clonidine  (Catapres ) protocol as recommended  Medications for other medical problems: --Resume home med nicotine  patch 21 mg transdermal every  24 hours for smoking cessation --Initiate nicotine  gum 2  mg p.o. as needed for smoking cessation  Other PRN Medications  -Acetaminophen  650 mg every 6 as needed/mild pain  -Maalox 30 mL oral every 4 as needed/digestion  -Magnesium  hydroxide 30 mL daily as needed/mild constipation   --The risks/benefits/side-effects/alternatives to this medication were discussed in detail with the patient and time was given for questions. The patient consents to medication trial.   -- Metabolic profile and EKG monitoring obtained while on an atypical antipsychotic (BMI: Lipid Panel: HbgA1c: QTc:)   -- Encouraged patient to participate in unit milieu and in scheduled group therapies   Safety and Monitoring:  Voluntary admission to inpatient psychiatric unit for safety, stabilization and treatment  Daily contact with patient to assess and evaluate symptoms and progress in treatment  Patient's case to be discussed in multi-disciplinary team meeting  Observation Level : q15 minute checks  Vital signs: q12 hours  Precautions: suicide, but pt currently verbally contracts for safety on unit?   Discharge Planning:  Social work and case management to assist with discharge planning and identification of hospital follow-up needs prior to discharge  Estimated LOS: 5-7?days  Discharge Concerns: Need to establish Safety plan; Medication compliance and effectiveness  Discharge Goals: Return home with outpatient referrals for mental health follow-up including medication management/psychotherapy.   Long Term Goal(s): Improvement in symptoms so as ready for discharge  Short Term Goals: Ability to identify changes in lifestyle to reduce recurrence of condition will improve, Ability to verbalize feelings will improve, Ability to disclose and discuss suicidal ideas, Ability to demonstrate self-control will improve, Ability to identify and develop effective coping behaviors will improve, Ability to maintain clinical  measurements within normal limits will improve, Compliance with prescribed medications will improve, and Ability to identify triggers associated with substance abuse/mental health issues will improve  Physician Treatment Plan for Secondary Diagnosis: Principal Problem:   MDD (major depressive disorder), recurrent severe, without psychosis (HCC)   I certify that inpatient services furnished can reasonably be expected to improve the patient's condition.   Ellouise JAYSON Azure, FNP 11/29/2023, 10:53 AM

## 2023-11-29 NOTE — Progress Notes (Signed)
   11/29/23 0900  Psych Admission Type (Psych Patients Only)  Admission Status Voluntary  Psychosocial Assessment  Patient Complaints Substance abuse  Eye Contact Darting  Facial Expression Sad  Affect Appropriate to circumstance  Speech Logical/coherent  Interaction Assertive  Motor Activity Other (Comment) (WNL)  Appearance/Hygiene Disheveled;Poor hygiene  Behavior Characteristics Cooperative  Mood Anxious;Preoccupied  Thought Process  Coherency WDL  Content WDL  Delusions None reported or observed  Perception WDL  Hallucination None reported or observed  Judgment WDL  Confusion None  Danger to Self  Current suicidal ideation? Denies  Agreement Not to Harm Self Yes  Description of Agreement verbal  Danger to Others  Danger to Others None reported or observed

## 2023-11-30 ENCOUNTER — Encounter (HOSPITAL_COMMUNITY): Payer: Self-pay

## 2023-11-30 NOTE — BH Assessment (Signed)
(  Sleep Hours) - 6.5 (Any PRNs that were needed, meds refused, or side effects to meds)-  (Any disturbances and when (visitation, over night)- None (Concerns raised by the patient)- Needy (SI/HI/AVH)- Denies

## 2023-11-30 NOTE — BH IP Treatment Plan (Signed)
 Interdisciplinary Treatment and Diagnostic Plan Update  11/30/2023 Time of Session: 10:35 AM Cassidy Underwood [facility] MRN: [id number]  Principal Diagnosis: MDD (major depressive disorder), recurrent severe, without psychosis (HCC)  Secondary Diagnoses: Principal Problem:   MDD (major depressive disorder), recurrent severe, without psychosis (HCC)   Current Medications:  Current Facility-Administered Medications  Medication Dose Route Frequency Provider Last Rate Last Admin   acetaminophen  (TYLENOL ) tablet 650 mg  650 mg Oral Q6H PRN Tex Drilling, NP       alum & mag hydroxide-simeth (MAALOX/MYLANTA) 200-200-20 MG/5ML suspension 30 mL  30 mL Oral Q4H PRN Nkwenti, Doris, NP       cloNIDine  (CATAPRES ) tablet 0.1 mg  0.1 mg Oral BH-qamhs Nkwenti, Doris, NP   0.1 mg at 11/30/23 9183   Followed by   NOREEN ON 12/01/2023] cloNIDine  (CATAPRES ) tablet 0.1 mg  0.1 mg Oral QAC breakfast Nkwenti, Doris, NP       dicyclomine  (BENTYL ) tablet 20 mg  20 mg Oral Q6H PRN Tex Drilling, NP   20 mg at 11/29/23 0154   diphenhydrAMINE  (BENADRYL ) capsule 50 mg  50 mg Oral TID PRN Tex Drilling, NP       diphenhydrAMINE  (BENADRYL ) injection 50 mg  50 mg Intramuscular TID PRN Tex Drilling, NP       And   LORazepam  (ATIVAN ) injection 2 mg  2 mg Intramuscular TID PRN Tex Drilling, NP       diphenhydrAMINE  (BENADRYL ) injection 50 mg  50 mg Intramuscular TID PRN Tex Drilling, NP       And   LORazepam  (ATIVAN ) injection 2 mg  2 mg Intramuscular TID PRN Tex Drilling, NP       feeding supplement (ENSURE PLUS HIGH PROTEIN) liquid 237 mL  237 mL Oral BID BM Bouchard, Marc A, DO   237 mL at 11/30/23 1039   hydrOXYzine  (ATARAX ) tablet 25 mg  25 mg Oral Q6H PRN Tex Drilling, NP   25 mg at 11/30/23 1306   loperamide  (IMODIUM ) capsule 2-4 mg  2-4 mg Oral PRN Tex Drilling, NP       magnesium  hydroxide (MILK OF MAGNESIA) suspension 30 mL  30 mL Oral Daily PRN Tex Drilling, NP       melatonin tablet 5 mg  5 mg  Oral QHS Ntuen, Tina C, FNP   5 mg at 11/29/23 2258   methocarbamol  (ROBAXIN ) tablet 500 mg  500 mg Oral Q8H PRN Tex Drilling, NP   500 mg at 11/29/23 1912   mirtazapine  (REMERON ) tablet 7.5 mg  7.5 mg Oral QHS Ntuen, Tina C, FNP   7.5 mg at 11/29/23 2258   naproxen  (NAPROSYN ) tablet 500 mg  500 mg Oral BID PRN Tex Drilling, NP   500 mg at 11/29/23 1912   nicotine  (NICODERM CQ  - dosed in mg/24 hours) patch 21 mg  21 mg Transdermal Q0600 Ntuen, Tina C, FNP       nicotine  polacrilex (NICORETTE ) gum 2 mg  2 mg Oral PRN Ntuen, Tina C, FNP   2 mg at 11/30/23 1306   ondansetron  (ZOFRAN -ODT) disintegrating tablet 4 mg  4 mg Oral Q6H PRN Tex Drilling, NP       prazosin (MINIPRESS) capsule 1 mg  1 mg Oral QHS Ntuen, Tina C, FNP   1 mg at 11/29/23 2258   QUEtiapine (SEROQUEL) tablet 100 mg  100 mg Oral QHS Ntuen, Tina C, FNP   100 mg at 11/29/23 2140   QUEtiapine (SEROQUEL) tablet 50 mg  50 mg Oral  Daily Ntuen, Tina C, FNP   50 mg at 11/30/23 9183   PTA Medications: Medications Prior to Admission  Medication Sig Dispense Refill Last Dose/Taking   buprenorphine -naloxone  (SUBOXONE ) 8-2 mg SUBL SL tablet Place 1 tablet under the tongue daily. (Patient not taking: Reported on 11/27/2023) 7 tablet 0    hydrOXYzine  (ATARAX ) 25 MG tablet Take 1 tablet (25 mg total) by mouth 3 (three) times daily as needed for anxiety. (Patient not taking: Reported on 11/27/2023) 30 tablet 0    mirtazapine  (REMERON ) 7.5 MG tablet Take 1 tablet (7.5 mg total) by mouth at bedtime. (Patient not taking: Reported on 11/27/2023) 30 tablet 0    nicotine  (NICODERM CQ  - DOSED IN MG/24 HOURS) 21 mg/24hr patch Place 1 patch (21 mg total) onto the skin daily at 6 (six) AM. (Patient not taking: Reported on 11/27/2023) 28 patch 0     Patient Stressors:    Patient Strengths:    Treatment Modalities: Medication Management, Group therapy, Case management,  1 to 1 session with clinician, Psychoeducation, Recreational  therapy.   Physician Treatment Plan for Primary Diagnosis: MDD (major depressive disorder), recurrent severe, without psychosis (HCC) Long Term Goal(s): Improvement in symptoms so as ready for discharge   Short Term Goals: Ability to identify changes in lifestyle to reduce recurrence of condition will improve Ability to verbalize feelings will improve Ability to disclose and discuss suicidal ideas Ability to demonstrate self-control will improve Ability to identify and develop effective coping behaviors will improve Ability to maintain clinical measurements within normal limits will improve Compliance with prescribed medications will improve Ability to identify triggers associated with substance abuse/mental health issues will improve  Medication Management: Evaluate patient's response, side effects, and tolerance of medication regimen.  Therapeutic Interventions: 1 to 1 sessions, Unit Group sessions and Medication administration.  Evaluation of Outcomes: Not Progressing  Physician Treatment Plan for Secondary Diagnosis: Principal Problem:   MDD (major depressive disorder), recurrent severe, without psychosis (HCC)  Long Term Goal(s): Improvement in symptoms so as ready for discharge   Short Term Goals: Ability to identify changes in lifestyle to reduce recurrence of condition will improve Ability to verbalize feelings will improve Ability to disclose and discuss suicidal ideas Ability to demonstrate self-control will improve Ability to identify and develop effective coping behaviors will improve Ability to maintain clinical measurements within normal limits will improve Compliance with prescribed medications will improve Ability to identify triggers associated with substance abuse/mental health issues will improve     Medication Management: Evaluate patient's response, side effects, and tolerance of medication regimen.  Therapeutic Interventions: 1 to 1 sessions, Unit Group  sessions and Medication administration.  Evaluation of Outcomes: Not Progressing   RN Treatment Plan for Primary Diagnosis: MDD (major depressive disorder), recurrent severe, without psychosis (HCC) Long Term Goal(s): Knowledge of disease and therapeutic regimen to maintain health will improve  Short Term Goals: Ability to remain free from injury will improve, Ability to verbalize frustration and anger appropriately will improve, Ability to demonstrate self-control, Ability to participate in decision making will improve, Ability to verbalize feelings will improve, Ability to disclose and discuss suicidal ideas, Ability to identify and develop effective coping behaviors will improve, and Compliance with prescribed medications will improve  Medication Management: RN will administer medications as ordered by provider, will assess and evaluate patient's response and provide education to patient for prescribed medication. RN will report any adverse and/or side effects to prescribing provider.  Therapeutic Interventions: 1 on 1 counseling sessions, Psychoeducation, Medication  administration, Evaluate responses to treatment, Monitor vital signs and CBGs as ordered, Perform/monitor CIWA, COWS, AIMS and Fall Risk screenings as ordered, Perform wound care treatments as ordered.  Evaluation of Outcomes: Not Progressing   LCSW Treatment Plan for Primary Diagnosis: MDD (major depressive disorder), recurrent severe, without psychosis (HCC) Long Term Goal(s): Safe transition to appropriate next level of care at discharge, Engage patient in therapeutic group addressing interpersonal concerns.  Short Term Goals: Engage patient in aftercare planning with referrals and resources, Increase social support, Increase ability to appropriately verbalize feelings, Increase emotional regulation, Facilitate acceptance of mental health diagnosis and concerns, Facilitate patient progression through stages of change regarding  substance use diagnoses and concerns, Identify triggers associated with mental health/substance abuse issues, and Increase skills for wellness and recovery  Therapeutic Interventions: Assess for all discharge needs, 1 to 1 time with Social worker, Explore available resources and support systems, Assess for adequacy in community support network, Educate family and significant other(s) on suicide prevention, Complete Psychosocial Assessment, Interpersonal group therapy.  Evaluation of Outcomes: Not Progressing   Progress in Treatment: Attending groups: Yes Participating in groups: Yes. Taking medication as prescribed: Yes. Toleration medication: Yes. Family/Significant other contact made: No, will contact:  Marguerita Der (peer support specialist) [phone] Patient understands diagnosis: Yes. Discussing patient identified problems/goals with staff: Yes. Medical problems stabilized or resolved: Yes. Denies suicidal/homicidal ideation: Yes. Issues/concerns per patient self-inventory: No.  New problem(s) identified:  No  New Short Term/Long Term Goal(s):  detox, medication management for mood stabilization; elimination of SI thoughts; development of comprehensive mental wellness/sobriety plan.   Patient Goals:  I came here to get off fentanyl  and I accomplished that goal.   Discharge Plan or Barriers:  Patient recently admitted. CSW will continue to follow and assess for appropriate referrals and possible discharge planning.    Reason for Continuation of Hospitalization: Depression Medication stabilization Detox  Estimated Length of Stay:  5 - 7 days  Last 3 [place] Suicide Severity Risk Score: Flowsheet Row [facility] Most recent reading at 11/28/2023  5:00 PM ED from 11/27/2023 in [facility] Most recent reading at 11/27/2023  1:25 PM ED from 11/27/2023 in [facility] Most recent reading at 11/27/2023 10:50 AM  C-SSRS RISK CATEGORY No Risk No Risk No Risk    Last PHQ 2/9 Scores:    11/28/2023    1:09 PM 11/27/2023   12:40 PM 10/27/2023    4:00 PM  Depression screen PHQ 2/9  Decreased Interest 1 1 1   Down, Depressed, Hopeless 1 1 1   PHQ - 2 Score 2 2 2   Altered sleeping 1 1 0  Tired, decreased energy 1 1 0  Change in appetite 1 1 0  Feeling bad or failure about yourself  1 1 1   Trouble concentrating 1 1 1   Moving slowly or fidgety/restless 1 1 0  Suicidal thoughts 1 1 0  PHQ-9 Score 9 9 4   Difficult doing work/chores Somewhat difficult Very difficult Somewhat difficult    Scribe for Treatment Team: Vineeth Fell O Laurice Iglesia, LCSWA 11/30/2023 1:34 PM
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
## 2023-11-30 NOTE — Plan of Care (Signed)
   Problem: Education: Goal: Knowledge of [facility] Education information/materials will improve Outcome: Progressing Goal: Emotional status will improve Outcome: Progressing Goal: Mental status will improve Outcome: Progressing Goal: Verbalization of understanding the information provided will improve Outcome: Progressing

## 2023-11-30 NOTE — Group Note (Signed)
 Date:  11/30/2023 Time:  4:27 PM  Group Topic/Focus: Identifying resources and support systems Crisis Planning:   The purpose of this group is to help patients create a crisis plan for use upon discharge or in the future, as needed.    Participation Level:  Active  Participation Quality:  Appropriate  Affect:  Appropriate  Cognitive:  Appropriate  Insight: Appropriate  Engagement in Group:  Engaged  Modes of Intervention:  Discussion  Additional Comments:  Pt engaged appropriately in group.  Helayne Metsker D Zhi Geier 11/30/2023, 4:27 PM

## 2023-11-30 NOTE — Plan of Care (Signed)
  Problem: Activity: Goal: Interest or engagement in activities will improve Outcome: Progressing Goal: Sleeping patterns will improve Outcome: Progressing   Problem: Coping: Goal: Ability to verbalize frustrations and anger appropriately will improve Outcome: Progressing Goal: Ability to demonstrate self-control will improve Outcome: Progressing   Problem: Safety: Goal: Periods of time without injury will increase Outcome: Progressing

## 2023-11-30 NOTE — Group Note (Signed)
 Date:  11/30/2023 Time:  9:44 AM  Group Topic/Focus:  Goals Group:   The focus of this group is to help patients establish daily goals to achieve during treatment and discuss how the patient can incorporate goal setting into their daily lives to aide in recovery.    Participation Level:  Active  Participation Quality:  Appropriate  Affect:  Appropriate  Cognitive:  Alert  Insight: Appropriate  Engagement in Group:  Improving  Modes of Intervention:  Orientation  Additional Comments:  Cassidy Underwood wants to be able to get better and focus on her way of staying clean. Be able to cope with not being able to have it and to go home.  Cassidy Underwood Miran Kautzman 11/30/2023, 9:44 AM
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
## 2023-11-30 NOTE — Plan of Care (Signed)
   Problem: Education: Goal: Emotional status will improve Outcome: Progressing Goal: Mental status will improve Outcome: Progressing Goal: Verbalization of understanding the information provided will improve Outcome: Progressing

## 2023-11-30 NOTE — Progress Notes (Signed)
   11/30/23 0932  Psych Admission Type (Psych Patients Only)  Admission Status Voluntary  Psychosocial Assessment  Patient Complaints Substance abuse  Eye Contact Fair  Facial Expression Flat;Sad  Affect Appropriate to circumstance  Speech Slow  Interaction Minimal  Motor Activity Other (Comment) (WNL)  Appearance/Hygiene Disheveled  Behavior Characteristics Cooperative  Mood Anxious;Sad  Thought Process  Coherency WDL  Content WDL  Delusions None reported or observed  Perception WDL  Hallucination None reported or observed  Judgment WDL  Confusion None  Danger to Self  Current suicidal ideation? Denies  Agreement Not to Harm Self Yes  Description of Agreement Verbal  Danger to Others  Danger to Others None reported or observed
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
## 2023-11-30 NOTE — Progress Notes (Signed)
 CONTACT NOTE:  [facility] [phone]   CSW spoke with Rosaline from admissions to confirm patient was accepted to [facility]. Rosaline confirmed patient is on their schedule for 12/03/23 @ 9am.   SIGNED: Louetta Lame, LCSW-A  11/30/23 3:55 PM

## 2023-12-01 DIAGNOSIS — F1120 Opioid dependence, uncomplicated: Principal | ICD-10-CM

## 2023-12-01 MED ORDER — PROPRANOLOL HCL 10 MG PO TABS
10.0000 mg | ORAL_TABLET | Freq: Two times a day (BID) | ORAL | Status: DC
  Administered 2023-12-01 – 2023-12-02 (×3): 10 mg via ORAL
  Filled 2023-12-01 (×3): qty 1

## 2023-12-01 MED ORDER — MELATONIN 5 MG PO TABS
10.0000 mg | ORAL_TABLET | Freq: Every day | ORAL | Status: DC
  Administered 2023-12-01: 10 mg via ORAL
  Filled 2023-12-01: qty 2
  Filled 2023-12-01: qty 28
  Filled 2023-12-01: qty 2

## 2023-12-01 NOTE — Progress Notes (Signed)
(  Sleep Hours) - 8.5 (Any PRNs that were needed, meds refused, or side effects to meds)-  Atarax  25 mg for anxiety, effective (Any disturbances and when (visitation, over night)- None (Concerns raised by the patient)-  NONE (SI/HI/AVH)- Denies

## 2023-12-01 NOTE — Plan of Care (Signed)
  Problem: Activity: Goal: Interest or engagement in activities will improve Outcome: Progressing Goal: Sleeping patterns will improve Outcome: Progressing   Problem: Coping: Goal: Ability to verbalize frustrations and anger appropriately will improve Outcome: Progressing Goal: Ability to demonstrate self-control will improve Outcome: Progressing   Problem: Safety: Goal: Periods of time without injury will increase Outcome: Progressing

## 2023-12-01 NOTE — Group Note (Signed)
 [facility] LCSW Group Therapy Note  12/01/2023  10:00-11:00AM  Type of Therapy and Topic:  Group Therapy:  Building Supports and Asking for Help  Participation Level:  Active   Description of Group:  Patients in this group wished to discuss whether it is a weakness to ask for help.  Processing of this question and related matters took place while group facilitator ensured that all patients had an opportunity to speak, responses were on topic, and the direction of the discussion was positive in nature.    Therapeutic Goals: 1)  discuss why asking for help can be a strength rather than a weakness  2)  explore individual patients' experience in asking for assistance  3)  encourage mutual support among group members  4)  demonstrate the importance of adding supports and of setting boundaries   Summary of Patient Progress:  The patient expressed full comprehension of the concepts presented.  However, she expressed great reluctance to ask for help because of her past experiences, stating that often people with drug addictions do not receive the help they need.  Therapeutic Modalities:   Processing Demonstration  Elgie JINNY Crest, LCSW 12/01/2023 1:23 PM

## 2023-12-01 NOTE — Group Note (Signed)
 Date:  12/01/2023 Time:  9:10 AM  Group Topic/Focus:  Goals Group:   The focus of this group is to help patients establish daily goals to achieve during treatment and discuss how the patient can incorporate goal setting into their daily lives to aide in recovery. Orientation:   The focus of this group is to educate the patient on the purpose and policies of crisis stabilization and provide a format to answer questions about their admission.  The group details unit policies and expectations of patients while admitted.    Participation Level:  Did Not Attend

## 2023-12-01 NOTE — Progress Notes (Signed)
   12/01/23 0938  Psych Admission Type (Psych Patients Only)  Admission Status Voluntary  Psychosocial Assessment  Patient Complaints Substance abuse;Anxiety  Eye Contact Fair  Facial Expression Flat  Affect Anxious  Speech Logical/coherent  Interaction Minimal  Motor Activity Other (Comment) (WNL)  Appearance/Hygiene Disheveled  Behavior Characteristics Cooperative  Mood Anxious  Thought Process  Coherency WDL  Content WDL  Delusions None reported or observed  Perception WDL  Hallucination None reported or observed  Judgment WDL  Confusion None  Danger to Self  Current suicidal ideation? Denies  Agreement Not to Harm Self Yes  Description of Agreement Verbal  Danger to Others  Danger to Others None reported or observed

## 2023-12-01 NOTE — Progress Notes (Addendum)
 [facility] MD Progress Note  12/01/2023 12:21 PM Climmie Cronce [facility]  MRN:  [id number]  Principal Problem: Opioid use disorder, severe, in controlled environment [facility]) Diagnosis: Principal Problem:   Opioid use disorder, severe, in controlled environment [facility]) Active Problems:   Cocaine abuse (HCC)   MDD (major depressive disorder), recurrent severe, without psychosis (HCC)   Reason for Admission:  Cassidy Underwood is an unhoused 28 year old Caucasian female with prior psychiatric history significant for MDD recurrent severe, without psychosis, homelessness, polysubstance abuse, substance-induced mood disorder, history of suicide attempts.  Patient presents voluntarily to [facility] from [facility] for physiological management of symptoms related to opioids and cocaine withdrawal (admitted on 11/28/2023, total  LOS: 3 days )   ASSESSMENT: Patient remains perseverative on opiate withdrawal and appears somewhat anxious.  Initiating propranolol to aid with her shaking and anxiety. Will monitor for cardiovascular symptoms although recent research has not shown the unopposed alpha stimulation to be as prominent of a problem as before when it comes to beta blocker+cocaine use. Discussed how her opiate withdrawal will improve with time. Could be a good suboxone  candidate but at this point expresses more fear of opiate withdrawal rather than experiencing worsening withdrawal symptoms. Patient has been arranged to go to [facility] on 12/03/23 so will plan for door-to-door transfer.    PLAN: -- Continue Seroquel tablets 50 mg in AM and 100 mg in PM -- Increase melatonin to 10 mg p.o. q. nightly as needed for insomnia -- Start propranolol 10 mg bid -- Continue Remeron  7.5 mg p.o. q. nightly for depression and sleep -- discontinue prazosin as patient denies nightmares -- COWS 1, d/c clonidine  taper   Disposition Planning: -- Estimated LOS: 5  days --Estimated Discharge Date 12/03/23 --Barriers to Discharge: substance use withdrawal -- Discharge Goals: Return home with outpatient referrals for mental health follow-up including medication management/psychotherapy  Subjective: The patient was seen and evaluated on the unit. On assessment today the patient reports still having some exposure opiate withdrawal including cold/heat sensitivity.  Denies SI/HI/AVH.  Reports that her sleep has been poor particularly with sleep initiation.  She does report when she falls asleep she will sleep appropriately.  She reports eating appropriately.  She reports feeling very anxious but not very depressed at this time.  She was amenable to trialing propranolol to aid with anxiety.  She is amenable to going to [facility] residential rehab as a door-to-door transfer to reduce risk of relapse. All questions were addressed.   Objective: Chart Review from last 24 hours:  The patient's chart was reviewed and nursing notes were reviewed. The patient's case was discussed in multidisciplinary team meeting.  - Overnight events to report per chart review / staff report: none - Patient took all prescribed medications yes - Patient received the following PRNs: hydroxyzine , nicorette  gum, naproxen , robaxin   Current Medications: Current Facility-Administered Medications  Medication Dose Route Frequency Provider Last Rate Last Admin   acetaminophen  (TYLENOL ) tablet 650 mg  650 mg Oral Q6H PRN Nkwenti, Doris, NP       alum & mag hydroxide-simeth (MAALOX/MYLANTA) 200-200-20 MG/5ML suspension 30 mL  30 mL Oral Q4H PRN Nkwenti, Doris, NP       dicyclomine  (BENTYL ) tablet 20 mg  20 mg Oral Q6H PRN Tex Drilling, NP   20 mg at 11/29/23 0154   diphenhydrAMINE  (BENADRYL ) capsule 50 mg  50 mg Oral TID PRN Tex Drilling, NP       diphenhydrAMINE  (BENADRYL ) injection  50 mg  50 mg Intramuscular TID PRN Tex Drilling, NP       And   LORazepam  (ATIVAN ) injection 2 mg  2 mg  Intramuscular TID PRN Tex Drilling, NP       diphenhydrAMINE  (BENADRYL ) injection 50 mg  50 mg Intramuscular TID PRN Tex Drilling, NP       And   LORazepam  (ATIVAN ) injection 2 mg  2 mg Intramuscular TID PRN Tex Drilling, NP       feeding supplement (ENSURE PLUS HIGH PROTEIN) liquid 237 mL  237 mL Oral BID BM Bouchard, Marc A, DO   237 mL at 12/01/23 1130   hydrOXYzine  (ATARAX ) tablet 25 mg  25 mg Oral Q6H PRN Tex Drilling, NP   25 mg at 12/01/23 1122   loperamide  (IMODIUM ) capsule 2-4 mg  2-4 mg Oral PRN Tex Drilling, NP       magnesium  hydroxide (MILK OF MAGNESIA) suspension 30 mL  30 mL Oral Daily PRN Tex Drilling, NP       melatonin tablet 10 mg  10 mg Oral QHS Lynnette Barter, MD       methocarbamol  (ROBAXIN ) tablet 500 mg  500 mg Oral Q8H PRN Tex Drilling, NP   500 mg at 12/01/23 1122   mirtazapine  (REMERON ) tablet 7.5 mg  7.5 mg Oral QHS Ntuen, Tina C, FNP   7.5 mg at 11/30/23 2119   naproxen  (NAPROSYN ) tablet 500 mg  500 mg Oral BID PRN Tex Drilling, NP   500 mg at 12/01/23 1122   nicotine  (NICODERM CQ  - dosed in mg/24 hours) patch 21 mg  21 mg Transdermal Q0600 Ntuen, Tina C, FNP   21 mg at 12/01/23 9251   nicotine  polacrilex (NICORETTE ) gum 2 mg  2 mg Oral PRN Ntuen, Tina C, FNP   2 mg at 11/30/23 1306   ondansetron  (ZOFRAN -ODT) disintegrating tablet 4 mg  4 mg Oral Q6H PRN Tex Drilling, NP       propranolol (INDERAL) tablet 10 mg  10 mg Oral BID Huber Mathers, MD   10 mg at 12/01/23 1122   QUEtiapine (SEROQUEL) tablet 100 mg  100 mg Oral QHS Ntuen, Tina C, FNP   100 mg at 11/30/23 2119   QUEtiapine (SEROQUEL) tablet 50 mg  50 mg Oral Daily Ntuen, Tina C, FNP   50 mg at 12/01/23 0747    Lab Results: No results found for this or any previous visit (from the past 48 hours).  Blood Alcohol level:  Lab Results  Component Value Date   [facility] <15 11/27/2023   ETH <15 10/24/2023    Metabolic Labs: Lab Results  Component Value Date   HGBA1C 5.3 11/27/2023   MPG 105.41  11/27/2023   MPG 102.54 10/24/2023   No results found for: PROLACTIN Lab Results  Component Value Date   CHOL 165 11/27/2023   TRIG 110 11/27/2023   HDL 65 11/27/2023   CHOLHDL 2.5 11/27/2023   VLDL 22 11/27/2023   LDLCALC 78 11/27/2023   LDLCALC 54 10/24/2023    Physical Findings: AIMS: No  CIWA:  CIWA-Ar Total: 3 COWS:  COWS Total Score: 1  Psychiatric Specialty Exam: General Appearance: Appropriate for Environment; Casual   Eye Contact: Good   Speech: Clear and Coherent   Volume: Normal   Mood: Anxious; Depressed   Affect: Congruent   Thought Content: Logical   Suicidal Thoughts: Suicidal Thoughts: No   Homicidal Thoughts: Homicidal Thoughts: No   Thought Process: Coherent   Orientation: Full (  Time, Place and Person)     Memory: Immediate Fair; Recent Fair   Judgment: Fair   Insight: Fair   Concentration: Good   Recall: Fair   Fund of Knowledge: Fair   Language: Good   Psychomotor Activity: Psychomotor Activity: Normal   Assets: Communication Skills; Physical Health; Resilience   Sleep: Sleep: Good Number of Hours of Sleep: 6.5    Review of Systems ROS  Vital Signs: Blood pressure 96/67, pulse 95, temperature 98.1 F (36.7 C), temperature source Oral, resp. rate 18, height 4' 8 (1.422 m), weight 39.9 kg, SpO2 100%. Body mass index is 19.73 kg/m. Physical Exam  I certify that inpatient services furnished can reasonably be expected to improve the patient's condition.   Signed: Prentice Espy, MD 12/01/2023, 12:21 PM

## 2023-12-01 NOTE — Group Note (Signed)
 Date:  12/01/2023 Time:  6:41 PM  Group Topic/Focus:  Making Healthy Choices:   The focus of this group is to help patients identify negative/unhealthy choices they were using prior to admission and identify positive/healthier coping strategies to replace them upon discharge. Self Care:   The focus of this group is to help patients understand the importance of self-care in order to improve or restore emotional, physical, spiritual, interpersonal, and financial health. Gut health: Teach about the microbiome of our intestines and how it impacts our physical and mental health. How to help it help us .    Participation Level:  Active  Participation Quality:  Appropriate  Affect:  Appropriate  Cognitive:  Appropriate  Insight: Appropriate  Engagement in Group:  Engaged  Modes of Intervention:  Discussion and Education  Additional Comments:    Cassidy Underwood 12/01/2023, 6:41 PM

## 2023-12-01 NOTE — Progress Notes (Signed)
 Adult Psychoeducational Group Note  Date:  12/01/2023 Time:  9:08 PM  Group Topic/Focus:  Wrap-Up Group:   The focus of this group is to help patients review their daily goal of treatment and discuss progress on daily workbooks.  Participation Level:  Active  Participation Quality:  Appropriate  Affect:  Appropriate  Cognitive:  Appropriate  Insight: Appropriate  Engagement in Group:  Engaged  Modes of Intervention:  Discussion  Additional Comments:  Pt stated her goal for the day was to cope with withdrawal symptoms. Pt met goal  Daine Lonita BIRCH 12/01/2023, 9:08 PM

## 2023-12-02 MED ORDER — HYDROXYZINE HCL 25 MG PO TABS: 25.0000 mg | ORAL_TABLET | Freq: Three times a day (TID) | ORAL | 0 refills | Status: AC | PRN

## 2023-12-02 MED ORDER — PROPRANOLOL HCL 10 MG PO TABS: 10.0000 mg | ORAL_TABLET | Freq: Two times a day (BID) | ORAL | 0 refills | Status: DC

## 2023-12-02 MED ORDER — NICOTINE 21 MG/24HR TD PT24: 21.0000 mg | MEDICATED_PATCH | Freq: Every day | TRANSDERMAL | 0 refills | Status: AC

## 2023-12-02 MED ORDER — QUETIAPINE FUMARATE 100 MG PO TABS: ORAL_TABLET | ORAL | 0 refills | Status: DC

## 2023-12-02 MED ORDER — PROPRANOLOL HCL 10 MG PO TABS
10.0000 mg | ORAL_TABLET | Freq: Three times a day (TID) | ORAL | Status: DC
  Administered 2023-12-02 – 2023-12-03 (×3): 10 mg via ORAL
  Filled 2023-12-02: qty 1
  Filled 2023-12-02: qty 42
  Filled 2023-12-02 (×2): qty 1

## 2023-12-02 MED ORDER — QUETIAPINE FUMARATE 200 MG PO TABS: 200.0000 mg | ORAL_TABLET | Freq: Every day | ORAL | 0 refills | Status: AC

## 2023-12-02 MED ORDER — MIRTAZAPINE 7.5 MG PO TABS: 7.5000 mg | ORAL_TABLET | Freq: Every day | ORAL | 0 refills | Status: AC

## 2023-12-02 MED ORDER — MELATONIN 10 MG PO TABS: 10.0000 mg | ORAL_TABLET | Freq: Every day | ORAL | 0 refills | Status: AC

## 2023-12-02 MED ORDER — QUETIAPINE FUMARATE 200 MG PO TABS
200.0000 mg | ORAL_TABLET | Freq: Every day | ORAL | Status: DC
  Administered 2023-12-02: 200 mg via ORAL
  Filled 2023-12-02: qty 1
  Filled 2023-12-02: qty 14

## 2023-12-02 MED ORDER — PROPRANOLOL HCL 10 MG PO TABS: 10.0000 mg | ORAL_TABLET | Freq: Three times a day (TID) | ORAL | 0 refills | Status: AC

## 2023-12-02 NOTE — Progress Notes (Signed)
(  Sleep Hours) - 7.75 (Any PRNs that were needed, meds refused, or side effects to meds)- PRN vistaril  25 mg given, no meds refused.  (Any disturbances and when (visitation, over night)- None  (Concerns raised by the patient)- None  (SI/HI/AVH)- Denies SI/HI/AVH

## 2023-12-02 NOTE — Progress Notes (Signed)
 CONTACT NOTE:  Marguerita Der (peer support specialist) [phone]   CSW updated patient's peer support specialist, Marguerita Der, regarding discharge. Patient will be leaving [facility] at 8:30 am to [facility] in [facility].  SIGNED: Garon Melander Nunez-Uva, LCSW-A  12/02/23 4:54 PM

## 2023-12-02 NOTE — Progress Notes (Signed)
   12/01/23 2103  Psych Admission Type (Psych Patients Only)  Admission Status Voluntary  Psychosocial Assessment  Patient Complaints Anxiety;Sleep disturbance  Eye Contact Fair  Facial Expression Anxious  Affect Preoccupied  Speech Logical/coherent  Interaction Needy  Motor Activity Other (Comment) (WDL)  Appearance/Hygiene Unremarkable  Behavior Characteristics Cooperative  Mood Anxious  Thought Process  Coherency WDL  Content WDL  Delusions None reported or observed  Perception WDL  Hallucination None reported or observed  Judgment WDL  Confusion None  Danger to Self  Current suicidal ideation? Denies  Agreement Not to Harm Self Yes  Description of Agreement Verbal  Danger to Others  Danger to Others None reported or observed
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
## 2023-12-02 NOTE — Group Note (Signed)
 Date:  12/02/2023 Time:  9:56 AM  Group Topic/Focus:  Goals Group:   The focus of this group is to help patients establish daily goals to achieve during treatment and discuss how the patient can incorporate goal setting into their daily lives to aide in recovery. Orientation:   The focus of this group is to educate the patient on the purpose and policies of crisis stabilization and provide a format to answer questions about their admission.  The group details unit policies and expectations of patients while admitted.    Participation Level:  Did Not Attend   Cassidy Underwood 12/02/2023, 9:56 AM

## 2023-12-02 NOTE — Group Note (Signed)
 Date:  12/02/2023 Time:  6:43 PM  Group Topic/Focus:  Making Healthy Choices:   The focus of this group is to help patients identify negative/unhealthy choices they were using prior to admission and identify positive/healthier coping strategies to replace them upon discharge. Relapse Prevention Planning:   The focus of this group is to define relapse and discuss the need for planning to combat relapse.  Watched video of addiction behavior and it's triggers and possible solutions. Gave handouts with description of video and thought provoking questons. Personal sharing and audience sharing.   Participation Level:  Active  Participation Quality:  Appropriate  Affect:  Appropriate  Cognitive:  Appropriate  Insight: Appropriate  Engagement in Group:  Engaged  Modes of Intervention:  Discussion and Education  Additional Comments:    [name] 12/02/2023, 6:43 PM

## 2023-12-02 NOTE — Group Note (Signed)
 Date:  12/02/2023 Time:  1:08 PM  Group Topic/Focus:  Emotional Education:  The group explored themes of acceptance, change, resilience, and control through song analysis and a lyric substitution activity. The goal of the group is to increase self awareness, emotional expression, autonomy, and self-esteem.   Participation Level:  Minimal  Participation Quality:  Appropriate  Affect:  Appropriate  Cognitive:  Appropriate  Insight: Appropriate  Engagement in Group:  Lacking  Modes of Intervention:  Activity and Discussion  Additional Comments:    Alaa Mullally D Makayle Krahn 12/02/2023, 1:08 PM

## 2023-12-02 NOTE — Progress Notes (Signed)
   12/02/23 1500  Psych Admission Type (Psych Patients Only)  Admission Status Voluntary  Psychosocial Assessment  Patient Complaints Anxiety  Eye Contact Fair  Facial Expression Anxious  Affect Preoccupied  Speech Logical/coherent  Interaction Needy  Motor Activity Slow  Appearance/Hygiene Unremarkable  Behavior Characteristics Cooperative  Mood Anxious  Thought Process  Coherency WDL  Content WDL  Delusions None reported or observed  Perception WDL  Hallucination None reported or observed  Judgment WDL  Confusion None  Danger to Self  Description of Suicide Plan No Plan  Agreement Not to Harm Self Yes  Description of Agreement Verbal  Danger to Others  Danger to Others None reported or observed

## 2023-12-02 NOTE — Progress Notes (Signed)
   12/02/23 1934  Psych Admission Type (Psych Patients Only)  Admission Status Voluntary  Psychosocial Assessment  Patient Complaints Anxiety  Eye Contact Fair  Facial Expression Anxious  Affect Preoccupied  Speech Logical/coherent  Interaction Needy  Motor Activity Slow  Appearance/Hygiene Unremarkable  Behavior Characteristics Cooperative  Mood Anxious  Thought Process  Coherency WDL  Content WDL  Delusions None reported or observed  Perception WDL  Hallucination None reported or observed  Judgment WDL  Confusion None  Danger to Self  Current suicidal ideation? Denies  Description of Suicide Plan No Plan  Agreement Not to Harm Self Yes  Description of Agreement Verbal  Danger to Others  Danger to Others None reported or observed

## 2023-12-02 NOTE — Plan of Care (Signed)
   Problem: Education: Goal: Emotional status will improve Outcome: Progressing Goal: Mental status will improve Outcome: Progressing Goal: Verbalization of understanding the information provided will improve Outcome: Progressing   Problem: Activity: Goal: Interest or engagement in activities will improve Outcome: Progressing
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
## 2023-12-02 NOTE — Hospital Course (Signed)
 Reason for Admission: Cassidy Underwood is an unhoused 28 year old Caucasian female with prior psychiatric history significant for MDD recurrent severe, without psychosis, homelessness, polysubstance abuse, substance-induced mood disorder, history of suicide attempts.  Patient presents voluntarily to [facility] for physiological management of symptoms related to opioids and cocaine withdrawal.   Hospital Course  During the patient's hospitalization, patient had extensive initial psychiatric evaluation, and follow-up psychiatric evaluations every day.  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 daily for mood stabilization --Increase Seroquel tablet from 50 mg to 100 mg p.o. q. nightly for mood stabilization -- Initiate melatonin 5 mg p.o. q. nightly as needed for insomnia -- Resume home medication Remeron  7.5 mg p.o. q. nightly for depression and sleep -- Initiate Prazosin 1 mg p.o. q. nightly for PTSD -- initiated clonidine  taper for opiate withdrawal  During the hospitalization, other adjustments were made to the patient's psychiatric medication regimen:  -consolidated quetiapine to 200 mg at bedtime -increased melatonin to 10 mg at bedtime -started propranolol 10 mg and uptitrated to tid  -stopped prazosin  Patient's care was discussed during the interdisciplinary team meeting every day during the hospitalization.  The patient denies having side effects to prescribed psychiatric medication.  Gradually, patient started adjusting to milieu. The patient was evaluated each day by a clinical provider to ascertain response to treatment. Improvement was noted by  the patient's report of decreasing symptoms, improved sleep and appetite, affect, medication tolerance, behavior, and participation in unit programming.  Patient was asked each day to complete a self inventory noting mood, mental status, pain, new symptoms, anxiety and concerns.    Symptoms were reported as significantly decreased or resolved completely by discharge.   On day of discharge, the patient reports that their mood is stable. The patient denied having suicidal thoughts for more than 48 hours prior to discharge.  Patient denies having homicidal thoughts.  Patient denies having auditory hallucinations.  Patient denies any visual hallucinations or other symptoms of psychosis. The patient was motivated to continue taking medication with a goal of continued improvement in mental health.   The patient reports their target psychiatric symptoms of depression responded well to the psychiatric medications, and the patient reports overall benefit other psychiatric hospitalization. Supportive psychotherapy was provided to the patient. The patient also participated in regular group therapy while hospitalized. Coping skills, problem solving as well as relaxation therapies were also part of the unit programming.  Labs were reviewed with the patient, and abnormal results were discussed with the patient.  The patient is able to verbalize their individual safety plan to this provider.  # It is recommended to the patient to continue psychiatric medications as prescribed, after discharge from the hospital.    # It is recommended to the patient to follow up with your outpatient psychiatric provider and PCP.  # It was discussed with the patient, the impact of alcohol, drugs, tobacco have been there overall psychiatric and medical wellbeing, and total abstinence from substance use was recommended the patient.ed.  # Prescriptions provided or sent directly to preferred pharmacy at discharge. Patient agreeable to  plan. Given opportunity to ask questions. Appears to feel comfortable with discharge.    # In the event of worsening symptoms, the patient is  instructed to call the crisis hotline, 911 and or go to the nearest ED for appropriate evaluation and treatment of symptoms. To follow-up with primary care provider for other medical issues, concerns and or health care needs  # Patient was discharged [facility] with a plan to follow up as noted below.

## 2023-12-02 NOTE — Group Note (Signed)
 Date:  12/02/2023 Time:  9:13 PM  Group Topic/Focus:  Wrap-Up Group:   The focus of this group is to help patients review their daily goal of treatment and discuss progress on daily workbooks.    Participation Level:  Active  Participation Quality:  Appropriate  Affect:  Appropriate  Cognitive:  Appropriate  Insight: Appropriate  Engagement in Group:  Engaged  Modes of Intervention:  Discussion  Additional Comments:   Pts anxiety has been high today. Pt states that she wants to get better at staying and remaining sober  Kazi Reppond A Valda Christenson 12/02/2023, 9:13 PM

## 2023-12-03 DIAGNOSIS — F1410 Cocaine abuse, uncomplicated: Secondary | ICD-10-CM

## 2023-12-03 DIAGNOSIS — F1120 Opioid dependence, uncomplicated: Secondary | ICD-10-CM

## 2023-12-03 DIAGNOSIS — F332 Major depressive disorder, recurrent severe without psychotic features: Principal | ICD-10-CM

## 2023-12-03 NOTE — Transportation (Signed)
 12/03/2023  Cassidy Underwood [facility] DOB: January 12, 1996 MRN: [id number]   RIDER WAIVER AND RELEASE OF LIABILITY  For the purposes of helping with transportation needs, [facility] partners with outside transportation providers (taxi companies, [place], Catering manager.) to give [facility] patients or other approved people the choice of on-demand rides Public librarian) to our buildings for non-emergency visits.  By using [employer], I, the person signing this document, on behalf of myself and/or any legal minors (in my care using the [employer]), agree:  Science writer given to me are supplied by independent, outside transportation providers who do not work for, or have any affiliation with, [employer]. [place] is not a transportation company. [place] has no control over the quality or safety of the rides I get using [employer]. [facility] has no control over whether any outside ride will happen on time or not. [place] [facility] gives no guarantee on the reliability, quality, safety, or availability on any rides, or that no mistakes will happen. I know and accept that traveling by vehicle (car, truck, SVU, fleeta, bus, taxi, etc.) has risks of serious injuries such as disability, being paralyzed, and death. I know and agree the risk of using [employer] is mine alone, and not [employer]. [employer] are provided as is and as are available. The transportation providers are in charge for all inspections and care of the vehicles used to provide these rides. I agree not to take legal action against [place], its agents, employees, officers, directors, representatives, insurers, attorneys, assigns, successors, subsidiaries, and affiliates at any time for any reasons related directly or indirectly to using [employer]. I also agree not to take legal action against [place] or its affiliates for any injury, death, or damage to property caused by or related to using  [employer]. I have read this Waiver and Release of Liability, and I understand the terms used in it and their legal meaning. This Waiver is freely and voluntarily given with the understanding that my right (or any legal minors) to legal action against [place] relating to [employer] is knowingly given up to use these services.   I attest that I read the Ride Waiver and Release of Liability to Cassidy Underwood [place], gave Ms. [facility] the opportunity to ask questions and answered the questions asked (if any). I affirm that Cassidy Underwood [facility] then provided consent for assistance with transportation.

## 2023-12-03 NOTE — [facility] Suicide Risk Assessment (Signed)
 [facility] Discharge Suicide Risk Assessment   Principal Problem: Opioid use disorder, severe, in controlled environment [facility]) Discharge Diagnoses: Principal Problem:   Opioid use disorder, severe, in controlled environment (HCC) Active Problems:   Cocaine abuse (HCC)   MDD (major depressive disorder), recurrent severe, without psychosis (HCC)   Total Time spent with patient: 30 minutes  Musculoskeletal: Strength & Muscle Tone: within normal limits Gait & Station: normal Patient leans: N/A  Psychiatric Specialty Exam  Presentation  General Appearance:  Appropriate for Environment; Casual  Eye Contact: Good  Speech: Clear and Coherent; Normal Rate  Speech Volume: Normal  Handedness: Right   Mood and Affect  Mood: Euthymic  Duration of Depression Symptoms: Greater than two weeks  Affect: Appropriate; Congruent   Thought Process  Thought Processes: Coherent; Goal Directed; Linear  Descriptions of Associations:Intact  Orientation:Full (Time, Place and Person)  Thought Content:Logical  History of Schizophrenia/Schizoaffective disorder:No  Duration of Psychotic Symptoms:No data recorded Hallucinations:Hallucinations: None  Ideas of Reference:None  Suicidal Thoughts:Suicidal Thoughts: No  Homicidal Thoughts:Homicidal Thoughts: No   Sensorium  Memory: Immediate Good; Recent Good; Remote Good  Judgment: Fair  Insight: Fair   Art therapist  Concentration: Good  Attention Span: Good  Recall: Good  Fund of Knowledge: Good  Language: Good   Psychomotor Activity  Psychomotor Activity: Psychomotor Activity: Normal   Assets  Assets: Communication Skills; Desire for Improvement; Physical Health   Sleep  Sleep: Sleep: Fair  Estimated Sleeping Duration (Last 24 Hours): 4.50-5.50 hours  Physical Exam: Physical Exam ROS Blood pressure 122/60, pulse 94, temperature 97.9 F (36.6 C), temperature source Oral, resp. rate 16,  height 4' 8 (1.422 m), weight 39.9 kg, SpO2 100%. Body mass index is 19.73 kg/m.  Mental Status Per Nursing Assessment::   On Admission:  NA  Demographic Factors:  Caucasian, Low socioeconomic status, Living alone, Unemployed, and homeless  Loss Factors: Decrease in vocational status and Financial problems/change in socioeconomic status  Historical Factors: Prior suicide attempts and Family history of mental illness or substance abuse  Risk Reduction Factors:   Positive therapeutic relationship and Positive coping skills or problem solving skills  Continued Clinical Symptoms:  Depression:   Comorbid alcohol abuse/dependence Alcohol/Substance Abuse/Dependencies Unstable or Poor Therapeutic Relationship Previous Psychiatric Diagnoses and Treatments  Cognitive Features That Contribute To Risk:  None    Suicide Risk:  Mild:  Suicidal ideation of limited frequency, intensity, duration, and specificity.  There are no identifiable plans, no associated intent, mild dysphoria and related symptoms, good self-control (both objective and subjective assessment), few other risk factors, and identifiable protective factors, including available and accessible social support.   Follow-up Information     [place] Go on 12/04/2023.   Specialty: Professional Counselor Why: Please go to this provider on 12/04/23 at 9:00 am for an assessment, to obtain therapy services.  You may also go on Monday through Friday, from 9 am to 1 pm. Contact information: [address]  [address] [place] [address] [phone]         [facility]. Go on 12/11/2023.   Specialty: Behavioral Health Why: Please go to this provider on 12/11/23 at 7:00 am for an assessment, to obtain medication management services. You may also go on Monday through Friday, arrive by 7:00 am for your initial assessment. Contact information: [address] [place]  [place] [address] [phone]        [facility], [facility]. Go to.   Why: Please arrive at [facility]  at 9:00 am on 12/03/23 to begin substance use recovery services. Contact information: [address] [place] [address] [phone]                 Plan Of Care/Follow-up recommendations:  See Discharge Summary  Lamar Sherlean Jama Chandra, DO 12/03/2023, 2:40 PM

## 2023-12-03 NOTE — Progress Notes (Signed)
  [facility] Adult Case Management Discharge Plan :  Will you be returning to the same living situation after discharge:  No. Patient is discharging to [facility]. At discharge, do you have transportation home?: No. CSW arranged taxi voucher for 8:30 am through [facility]. Do you have the ability to pay for your medications: No. Patient is uninsured and unemployed.  Release of information consent forms completed and in the chart;  Patient's signature needed at discharge.  Patient to Follow up at:  Follow-up Information     [place] Go on 12/04/2023.   Specialty: Professional Counselor Why: Please go to this provider on 12/04/23 at 9:00 am for an assessment, to obtain therapy services.  You may also go on Monday through Friday, from 9 am to 1 pm. Contact information: [address]  [address] [place] [address] [phone]         [facility]. Go on 12/11/2023.   Specialty: Behavioral Health Why: Please go to this provider on 12/11/23 at 7:00 am for an assessment, to obtain medication management services. You may also go on Monday through Friday, arrive by 7:00 am for your initial assessment. Contact information: [address] [place]  [id number] [phone]                Next level of care provider has access to [facility] Link:no  Safety Planning and Suicide Prevention discussed: Yes,  completed with patient's peer support specialist, Marguerita Der, [phone].     Has patient been referred to the Quitline?: Patient does not use tobacco/nicotine  products  Patient has been referred for addiction treatment: Yes, the patient will follow up with an outpatient provider for substance use disorder. Psychiatrist/APP: appointment made. [facility] [address] [place], [place] [address]   Louetta Lame, LCSWA 12/03/2023,  8:13 AM
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
## 2023-12-03 NOTE — BHH Suicide Risk Assessment (Deleted)
 [facility] Discharge Suicide Risk Assessment   Principal Problem: Opioid use disorder, severe, in controlled environment [facility]) Discharge Diagnoses: Principal Problem:   Opioid use disorder, severe, in controlled environment [facility]) Active Problems:   Cocaine abuse (HCC)   MDD (major depressive disorder), recurrent severe, without psychosis (HCC)   Reason for Admission: Cassidy Underwood is an unhoused 28 year old Caucasian female with prior psychiatric history significant for MDD recurrent severe, without psychosis, homelessness, polysubstance abuse, substance-induced mood disorder, history of suicide attempts.  Patient presents voluntarily to [facility] from [facility] for physiological management of symptoms related to opioids and cocaine withdrawal.   Hospital Course  During the patient's hospitalization, patient had extensive initial psychiatric evaluation, and follow-up psychiatric evaluations every day.  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 daily for mood stabilization --Increase Seroquel tablet from 50 mg to 100 mg p.o. q. nightly for mood stabilization -- Initiate melatonin 5 mg p.o. q. nightly as needed for insomnia -- Resume home medication Remeron  7.5 mg p.o. q. nightly for depression and sleep -- Initiate Prazosin 1 mg p.o. q. nightly for PTSD -- initiated clonidine  taper for opiate withdrawal  During the hospitalization, other adjustments were made to the patient's psychiatric medication regimen:  -consolidated quetiapine to 200 mg at bedtime -increased melatonin to 10 mg at bedtime -started propranolol 10 mg and uptitrated to tid  -stopped  prazosin  Patient's care was discussed during the interdisciplinary team meeting every day during the hospitalization.  The patient denies having side effects to prescribed psychiatric medication.  Gradually, patient started adjusting to milieu. The patient was evaluated each day by a clinical provider to ascertain response to treatment. Improvement was noted by the patient's report of decreasing symptoms, improved sleep and appetite, affect, medication tolerance, behavior, and participation in unit programming.  Patient was asked each day to complete a self inventory noting mood, mental status, pain, new symptoms, anxiety and concerns.    Symptoms were reported as significantly decreased or resolved completely by discharge.   On day of discharge, the patient reports that their mood is stable. The patient denied having suicidal thoughts for more than 48 hours prior to discharge.  Patient denies having homicidal thoughts.  Patient denies having auditory hallucinations.  Patient denies any visual hallucinations or other symptoms of psychosis. The patient was motivated to continue taking medication with a goal of continued improvement in mental health.   The patient reports their target psychiatric symptoms of depression responded well to the psychiatric medications, and the patient reports overall benefit other psychiatric hospitalization. Supportive psychotherapy was provided to the patient. The patient also participated in regular group therapy while hospitalized. Coping skills, problem solving as well as relaxation therapies were also part of the unit programming.  Labs were reviewed with the patient, and abnormal results were discussed with the patient.  The patient is able to verbalize their individual safety plan to this provider.  # It is recommended to the patient to continue psychiatric medications as prescribed, after discharge from the hospital.    # It is recommended to the patient to  follow up with your outpatient psychiatric provider and PCP.  # It was discussed with the patient, the impact of alcohol, drugs, tobacco have been there overall psychiatric and medical  wellbeing, and total abstinence from substance use was recommended the patient.ed.  # Prescriptions provided or sent directly to preferred pharmacy at discharge. Patient agreeable to plan. Given opportunity to ask questions. Appears to feel comfortable with discharge.    # In the event of worsening symptoms, the patient is instructed to call the crisis hotline, 911 and or go to the nearest ED for appropriate evaluation and treatment of symptoms. To follow-up with primary care provider for other medical issues, concerns and or health care needs  # Patient was discharged [facility] with a plan to follow up as noted below.   Total Time spent with patient: 1 hour  Musculoskeletal: Strength & Muscle Tone: within normal limits Gait & Station: normal Patient leans: N/A  Psychiatric Specialty Exam  Presentation  General Appearance: Appropriate for Environment; Casual   Eye Contact:Good   Speech:Clear and Coherent; Normal Rate   Speech Volume:Normal     Mood and Affect  Mood:Euthymic   Affect:Appropriate; Congruent    Thought Process  Thought Processes:Coherent; Goal Directed; Linear   Descriptions of Associations:Intact   Orientation:Full (Time, Place and Person)   Thought Content:Logical   Hallucinations:Hallucinations: None  Ideas of Reference:None   Suicidal Thoughts:Suicidal Thoughts: No  Homicidal Thoughts:Homicidal Thoughts: No   Sensorium  Memory:Immediate Good; Recent Good; Remote Good   Judgment:Fair   Insight:Fair    Executive Functions  Concentration:Good   Attention Span:Good   Recall:Good   Fund of Knowledge:Good   Language:Good    Psychomotor Activity  Psychomotor Activity:Psychomotor Activity:  Normal   Assets  Assets:Communication Skills; Desire for Improvement; Physical Health    Sleep  Sleep:Sleep: Fair   Blood pressure 122/60, pulse 94, temperature 97.9 F (36.6 C), temperature source Oral, resp. rate 16, height 4' 8 (1.422 m), weight 39.9 kg, SpO2 100%. Body mass index is 19.73 kg/m.  Mental Status Per Nursing Assessment::   On Admission:  NA  Demographic Factors:  Caucasian  Loss Factors: NA  Historical Factors: Impulsivity  Risk Reduction Factors:   Living with another person, especially a relative  Continued Clinical Symptoms:  More than one psychiatric diagnosis  Cognitive Features That Contribute To Risk:  None    Suicide Risk:  Minimal: No identifiable suicidal ideation.  Patients presenting with no risk factors but with morbid ruminations; may be classified as minimal risk based on the severity of the depressive symptoms   Follow-up Information     [place] Go on 12/04/2023.   Specialty: Professional Counselor Why: Please go to this provider on 12/04/23 at 9:00 am for an assessment, to obtain therapy services.  You may also go on Monday through Friday, from 9 am to 1 pm. Contact information: [address]  [address] [place] [address] [phone]         [facility]. Go on 12/11/2023.   Specialty: Behavioral Health Why: Please go to this provider on 12/11/23 at 7:00 am for an assessment, to obtain medication management services. You may also go on Monday through Friday, arrive by 7:00 am for your initial assessment. Contact information: [address] [place]  [id number] [phone]                Follow-up recommendations:   Activity:  as tolerated Diet:  heart healthy   Comments:  Prescriptions were given at discharge.  Patient is agreeable with the discharge plan.  Patient was given an opportunity to ask questions.  Patient appears to feel  comfortable with discharge and denies any current suicidal or homicidal thoughts.    Patient is instructed prior to discharge to: Take all medications as prescribed by mental healthcare provider. Report any adverse effects and or reactions from the medicines to outpatient provider promptly. In the event of worsening symptoms, patient is instructed to call the crisis hotline, 911 and or go to the nearest ED for appropriate evaluation and treatment of symptoms. Patient is to follow-up with primary care provider for other medical issues, concerns and or health care needs.    Prentice Espy, MD 12/03/2023, 8:37 AM

## 2023-12-03 NOTE — Progress Notes (Signed)
 Patient verbalizes readiness for discharge. All patient belongings returned to patient. Discharge instructions read and discussed with patient (appointments, medications, resources). Patient expressed gratitude for care provided. Patient discharged to lobby at [phone] where taxi driver was waiting.

## 2023-12-03 NOTE — Plan of Care (Signed)
  Problem: Activity: Goal: Sleeping patterns will improve Outcome: Progressing   
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
## 2024-02-25 ENCOUNTER — Other Ambulatory Visit: Payer: Self-pay

## 2024-02-25 ENCOUNTER — Emergency Department (HOSPITAL_COMMUNITY): Payer: MEDICAID

## 2024-02-25 ENCOUNTER — Emergency Department (HOSPITAL_COMMUNITY)
Admission: EM | Admit: 2024-02-25 | Discharge: 2024-02-25 | Discharge status: Against Medical Advice | Payer: MEDICAID | Attending: Emergency Medicine | Admitting: Emergency Medicine

## 2024-02-25 DIAGNOSIS — R0602 Shortness of breath: Secondary | ICD-10-CM | POA: Diagnosis not present

## 2024-02-25 DIAGNOSIS — Z5321 Procedure and treatment not carried out due to patient leaving prior to being seen by health care provider: Secondary | ICD-10-CM | POA: Diagnosis not present

## 2024-02-25 DIAGNOSIS — R059 Cough, unspecified: Secondary | ICD-10-CM | POA: Diagnosis not present

## 2024-02-25 DIAGNOSIS — Z5900 Homelessness unspecified: Secondary | ICD-10-CM | POA: Insufficient documentation

## 2024-02-25 DIAGNOSIS — M549 Dorsalgia, unspecified: Secondary | ICD-10-CM | POA: Insufficient documentation

## 2024-02-25 DIAGNOSIS — R5383 Other fatigue: Secondary | ICD-10-CM | POA: Diagnosis not present

## 2024-02-25 DIAGNOSIS — R079 Chest pain, unspecified: Secondary | ICD-10-CM | POA: Diagnosis present

## 2024-02-25 LAB — COMPREHENSIVE METABOLIC PANEL WITH GFR
ALT: 16 U/L (ref 0–44)
AST: 17 U/L (ref 15–41)
Albumin: 3.6 g/dL (ref 3.5–5.0)
Alkaline Phosphatase: 87 U/L (ref 38–126)
Anion gap: 13 (ref 5–15)
BUN: 9 mg/dL (ref 6–20)
CO2: 28 mmol/L (ref 22–32)
Calcium: 9.3 mg/dL (ref 8.9–10.3)
Chloride: 100 mmol/L (ref 98–111)
Creatinine, Ser: 0.56 mg/dL (ref 0.44–1.00)
GFR, Estimated: >60 mL/min
Glucose, Bld: 178 mg/dL — ABNORMAL HIGH (ref 70–99)
Potassium: 3.5 mmol/L (ref 3.5–5.1)
Sodium: 141 mmol/L (ref 135–145)
Total Bilirubin: 0.3 mg/dL (ref 0.0–1.2)
Total Protein: 7.5 g/dL (ref 6.5–8.1)

## 2024-02-25 LAB — CBC WITH DIFFERENTIAL/PLATELET
Abs Immature Granulocytes: 0.22 K/uL — ABNORMAL HIGH (ref 0.00–0.07)
Basophils Absolute: 0.1 K/uL (ref 0.0–0.1)
Basophils Relative: 1 %
Eosinophils Absolute: 0.1 K/uL (ref 0.0–0.5)
Eosinophils Relative: 1 %
HCT: 41.1 % (ref 36.0–46.0)
Hemoglobin: 13.6 g/dL (ref 12.0–15.0)
Immature Granulocytes: 2 %
Lymphocytes Relative: 16 %
Lymphs Abs: 2.2 K/uL (ref 0.7–4.0)
MCH: 28.5 pg (ref 26.0–34.0)
MCHC: 33.1 g/dL (ref 30.0–36.0)
MCV: 86 fL (ref 80.0–100.0)
Monocytes Absolute: 0.3 K/uL (ref 0.1–1.0)
Monocytes Relative: 2 %
Neutro Abs: 11.3 K/uL — ABNORMAL HIGH (ref 1.7–7.7)
Neutrophils Relative %: 78 %
Platelets: 504 K/uL — ABNORMAL HIGH (ref 150–400)
RBC: 4.78 MIL/uL (ref 3.87–5.11)
RDW: 12.2 % (ref 11.5–15.5)
WBC: 14.2 K/uL — ABNORMAL HIGH (ref 4.0–10.5)
nRBC: 0 % (ref 0.0–0.2)

## 2024-02-25 LAB — MAGNESIUM: Magnesium: 2.3 mg/dL (ref 1.7–2.4)

## 2024-02-25 LAB — HCG, SERUM, QUALITATIVE: Preg, Serum: NEGATIVE

## 2024-02-25 MED ORDER — ALBUTEROL SULFATE HFA 108 (90 BASE) MCG/ACT IN AERS: 2.0000 | INHALATION_SPRAY | Freq: Once | RESPIRATORY_TRACT | Status: DC

## 2024-02-25 NOTE — ED Notes (Signed)
 Pt called for vitals x3 no response.
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
## 2024-02-25 NOTE — ED Provider Triage Note (Signed)
 Emergency Medicine Provider Triage Evaluation Note  Cassidy Underwood [facility] , a 29 y.o. female  was evaluated in triage.  Pt complains of multiple complaints.  Review of Systems  Positive: Chest pain, back pain, cough, shortness of breath, fatigue Negative: Abdominal pain, nausea, vomiting  Physical Exam  There were no vitals taken for this visit. Gen:   Awake, no distress   Resp:  Normal effort  MSK:   Moves extremities without difficulty   Medical Decision Making  Medically screening exam initiated at 9:54 AM.  Appropriate orders placed.  Cassidy Underwood [facility] was informed that the remainder of the evaluation will be completed by another provider, this initial triage assessment does not replace that evaluation, and the importance of remaining in the ED until their evaluation is complete.  Patient presenting for multiple complaints.  She arrives with her partner, both of which have been living in a tent with presence of fungal growth.  They are concerned about exposure.   Melvenia Motto, MD 02/25/24 [phone]

## 2024-02-25 NOTE — ED Notes (Signed)
 Pt called for role call x3 no response
